# Patient Record
Sex: Female | Born: 1945 | Race: White | Hispanic: No | Marital: Married | Smoking: Former smoker
Health system: Southern US, Community
[De-identification: ages and names within clinical notes are randomized; demographics above are authoritative.]

## PROBLEM LIST (undated history)

## (undated) DIAGNOSIS — I10 Essential (primary) hypertension: Secondary | ICD-10-CM

## (undated) DIAGNOSIS — T7840XA Allergy, unspecified, initial encounter: Secondary | ICD-10-CM

## (undated) DIAGNOSIS — C801 Malignant (primary) neoplasm, unspecified: Secondary | ICD-10-CM

## (undated) DIAGNOSIS — E785 Hyperlipidemia, unspecified: Secondary | ICD-10-CM

## (undated) DIAGNOSIS — J45909 Unspecified asthma, uncomplicated: Secondary | ICD-10-CM

## (undated) HISTORY — DX: Essential (primary) hypertension: I10

## (undated) HISTORY — DX: Allergy, unspecified, initial encounter: T78.40XA

## (undated) HISTORY — DX: Hyperlipidemia, unspecified: E78.5

## (undated) HISTORY — DX: Unspecified asthma, uncomplicated: J45.909

---

## 1975-05-20 HISTORY — PX: DILATION AND CURETTAGE OF UTERUS: SHX78

## 1987-05-20 HISTORY — PX: ABDOMINAL HYSTERECTOMY: SHX81

## 1989-05-19 HISTORY — PX: WRIST ARTHROPLASTY: SHX1088

## 1998-05-19 HISTORY — PX: TRIGGER FINGER RELEASE: SHX641

## 2003-03-22 ENCOUNTER — Other Ambulatory Visit: Payer: Self-pay

## 2006-01-22 ENCOUNTER — Ambulatory Visit: Payer: Self-pay | Admitting: Family Medicine

## 2006-03-03 ENCOUNTER — Ambulatory Visit: Payer: Self-pay | Admitting: Gastroenterology

## 2006-03-03 LAB — HM COLONOSCOPY: HM COLON: NORMAL

## 2007-06-03 ENCOUNTER — Ambulatory Visit: Payer: Self-pay | Admitting: Family Medicine

## 2007-06-08 ENCOUNTER — Ambulatory Visit: Payer: Self-pay | Admitting: Family Medicine

## 2009-02-06 ENCOUNTER — Ambulatory Visit: Payer: Self-pay | Admitting: Family Medicine

## 2010-03-18 ENCOUNTER — Ambulatory Visit: Payer: Self-pay | Admitting: Family Medicine

## 2011-04-23 ENCOUNTER — Ambulatory Visit: Payer: Self-pay | Admitting: Family Medicine

## 2012-04-27 ENCOUNTER — Ambulatory Visit: Payer: Self-pay | Admitting: Family Medicine

## 2012-06-08 ENCOUNTER — Ambulatory Visit: Payer: Self-pay | Admitting: Family Medicine

## 2012-06-08 LAB — HM DEXA SCAN

## 2012-06-28 ENCOUNTER — Ambulatory Visit: Payer: Self-pay | Admitting: Family Medicine

## 2013-04-28 ENCOUNTER — Ambulatory Visit: Payer: Self-pay | Admitting: Family Medicine

## 2013-04-28 LAB — HM MAMMOGRAPHY

## 2013-06-30 LAB — LIPID PANEL
Cholesterol: 189 mg/dL (ref 0–200)
HDL: 53 mg/dL (ref 35–70)
LDL Cholesterol: 103 mg/dL
Triglycerides: 167 mg/dL — AB (ref 40–160)

## 2013-06-30 LAB — TSH: TSH: 2.65 u[IU]/mL (ref 0.41–5.90)

## 2013-06-30 LAB — HEMOGLOBIN A1C: Hgb A1c MFr Bld: 5.6 % (ref 4.0–6.0)

## 2013-08-17 LAB — CBC AND DIFFERENTIAL
HCT: 44 % (ref 36–46)
Hemoglobin: 15.1 g/dL (ref 12.0–16.0)
Platelets: 244 10*3/uL (ref 150–399)
WBC: 10.5 10^3/mL

## 2013-08-17 LAB — HEPATIC FUNCTION PANEL
ALT: 20 U/L (ref 7–35)
AST: 20 U/L (ref 13–35)

## 2013-08-17 LAB — BASIC METABOLIC PANEL
BUN: 14 mg/dL (ref 4–21)
Creatinine: 0.5 mg/dL (ref 0.5–1.1)
Glucose: 82 mg/dL
POTASSIUM: 4.5 mmol/L (ref 3.4–5.3)
SODIUM: 140 mmol/L (ref 137–147)

## 2014-09-17 DIAGNOSIS — C801 Malignant (primary) neoplasm, unspecified: Secondary | ICD-10-CM

## 2014-09-17 HISTORY — DX: Malignant (primary) neoplasm, unspecified: C80.1

## 2014-09-20 DIAGNOSIS — L309 Dermatitis, unspecified: Secondary | ICD-10-CM | POA: Insufficient documentation

## 2014-09-20 DIAGNOSIS — E781 Pure hyperglyceridemia: Secondary | ICD-10-CM | POA: Insufficient documentation

## 2014-09-20 DIAGNOSIS — R739 Hyperglycemia, unspecified: Secondary | ICD-10-CM | POA: Insufficient documentation

## 2014-09-20 DIAGNOSIS — J309 Allergic rhinitis, unspecified: Secondary | ICD-10-CM | POA: Insufficient documentation

## 2014-09-20 DIAGNOSIS — F4320 Adjustment disorder, unspecified: Secondary | ICD-10-CM | POA: Insufficient documentation

## 2014-09-20 DIAGNOSIS — I10 Essential (primary) hypertension: Secondary | ICD-10-CM | POA: Insufficient documentation

## 2014-09-20 DIAGNOSIS — D582 Other hemoglobinopathies: Secondary | ICD-10-CM | POA: Insufficient documentation

## 2014-09-20 DIAGNOSIS — J45909 Unspecified asthma, uncomplicated: Secondary | ICD-10-CM | POA: Insufficient documentation

## 2014-09-20 DIAGNOSIS — K529 Noninfective gastroenteritis and colitis, unspecified: Secondary | ICD-10-CM | POA: Insufficient documentation

## 2014-10-18 HISTORY — PX: SKIN CANCER EXCISION: SHX779

## 2014-10-25 ENCOUNTER — Other Ambulatory Visit: Payer: Self-pay | Admitting: Family Medicine

## 2014-10-30 ENCOUNTER — Ambulatory Visit (INDEPENDENT_AMBULATORY_CARE_PROVIDER_SITE_OTHER): Payer: Medicare Other | Admitting: Family Medicine

## 2014-10-30 ENCOUNTER — Encounter: Payer: Self-pay | Admitting: Family Medicine

## 2014-10-30 VITALS — BP 150/86 | HR 72 | Temp 97.7°F | Resp 16 | Ht 66.75 in | Wt 221.0 lb

## 2014-10-30 DIAGNOSIS — Z1211 Encounter for screening for malignant neoplasm of colon: Secondary | ICD-10-CM

## 2014-10-30 DIAGNOSIS — I10 Essential (primary) hypertension: Secondary | ICD-10-CM

## 2014-10-30 DIAGNOSIS — E781 Pure hyperglyceridemia: Secondary | ICD-10-CM

## 2014-10-30 DIAGNOSIS — R0789 Other chest pain: Secondary | ICD-10-CM

## 2014-10-30 DIAGNOSIS — Z1239 Encounter for other screening for malignant neoplasm of breast: Secondary | ICD-10-CM

## 2014-10-30 DIAGNOSIS — Z0000 Encounter for general adult medical examination without abnormal findings: Secondary | ICD-10-CM | POA: Diagnosis not present

## 2014-10-30 DIAGNOSIS — R739 Hyperglycemia, unspecified: Secondary | ICD-10-CM

## 2014-10-30 LAB — IFOBT (OCCULT BLOOD): IFOBT: NEGATIVE

## 2014-10-30 MED ORDER — LOSARTAN POTASSIUM 100 MG PO TABS: 100.0000 mg | ORAL_TABLET | Freq: Every day | ORAL | Status: DC

## 2014-10-30 NOTE — Progress Notes (Signed)
Patient ID: KORTNIE STOVALL, female   DOB: [birth year], 69 y.o.   MRN: [id number]  Patient: LIZMARIE WITTERS, Female    DOB: [birth year], 69 y.o.   MRN: [id number] Visit Date: 10/30/2014  Today's Provider: Margarita Rana, MD   Chief Complaint  Patient presents with  . Medicare Wellness  . Hypertension   Subjective:    Annual wellness visit JAMILAH JEAN is a 69 y.o. female who presents today for her Subsequent Annual Wellness Visit. She feels well. She reports exercising daily, pt reports that she stays active and takes daily walks for about 40 minutes a day. She reports she is sleeping well. Last CPE: 07/04/2013 06/08/12 BMD: osteopenia 03/03/06 Colonoscopy: Normal 04/28/13 Mommo-BI-RADS 1 02/27/11 EKG 07/04/13 PCV 13 09/30/11 Pneumovax 23 09/21/08 Tdap  Hypertension Patient is requesting that we refill her medication Losartan '100mg'$ . Patient reports that this medication was prescribed by Dr. Clayborn Bigness.  She also has had several episodes over the past few year. Has had negative cardiac work up. Thinks it mostly after she is hot and has eaten too much. Does have her gallbladder. Brother has same thing and he gets better with Tums.   -----------------------------------------------------------   Review of Systems  Constitutional: Negative.   HENT: Positive for congestion, postnasal drip, rhinorrhea, sneezing and trouble swallowing.   Eyes: Positive for photophobia.  Respiratory: Negative.   Cardiovascular: Negative.   Gastrointestinal: Negative.   Endocrine: Negative.   Genitourinary: Negative.   Musculoskeletal: Negative.   Skin: Positive for rash.  Allergic/Immunologic: Positive for environmental allergies.  Neurological: Negative.   Hematological: Negative.   Psychiatric/Behavioral: Negative.     History   Social History  . Marital Status: Married    Spouse Name: Eulas Post  . Number of Children: 2  . Years of Education: N/A   Occupational History  . retired    Social History Main  Topics  . Smoking status: Former Smoker -- 0.50 packs/day for 35 years    Quit date: 05/20/2003  . Smokeless tobacco: Never Used  . Alcohol Use: 3.6 oz/week    6 Glasses of wine per week     Comment: occasional  . Drug Use: No  . Sexual Activity: Not on file   Other Topics Concern  . Not on file   Social History Narrative    Patient Active Problem List   Diagnosis Date Noted  . 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 Allergic rhinitis 09/20/2014  . 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 Colitis 09/20/2014  . Dermatitis, eczematoid 09/20/2014  . Elevated hemoglobin 09/20/2014  . Calcium blood increased 09/20/2014  . Blood glucose elevated 09/20/2014  . BP (high blood pressure) 09/20/2014  . Hypertriglyceridemia 09/20/2014    Past Surgical History  Procedure Laterality Date  . Abdominal hysterectomy  1989    due to menorrhagia  . Dilation and curettage of uterus  1977  . Wrist arthroplasty Left 1991  . Trigger finger release Right 2000  . Skin cancer excision  10/2014    on chest; Dr. Evorn Gong    Her family history includes Arthritis in her mother; COPD in her father; Cancer in her brother, brother, and father; Cerebrovascular Disease in her mother; Heart disease in her mother; Hypertension in her father.    Previous Medications   CALCIUM-VITAMIN D 600-200 MG-UNIT PER TABLET    Take by mouth.   CETIRIZINE (ZYRTEC) 10 MG TABLET    Take by mouth.   CLOBETASOL CREAM (TEMOVATE) 0.05 %       DICLOFENAC SODIUM (  VOLTAREN) 1 % GEL    Place onto the skin.   DULOXETINE (CYMBALTA) 30 MG CAPSULE    Take by mouth.   FLUTICASONE (FLONASE) 50 MCG/ACT NASAL SPRAY    Place into the nose.   FLUTICASONE-SALMETEROL (ADVAIR) 250-50 MCG/DOSE AEPB    Inhale into the lungs.   FOLIC ACID (FOLVITE) 1 MG TABLET    Take by mouth.   LOSARTAN (COZAAR) 100 MG TABLET    Take by mouth.   METAXALONE (SKELAXIN) 800 MG TABLET    Take by mouth.   METHOTREXATE 2.5 MG TABLET    Take 5 tablets by mouth once a week.    METOPROLOL SUCCINATE (TOPROL-XL) 50 MG 24 HR TABLET    TAKE ONE TABLET BY MOUTH TWICE DAILY   MONTELUKAST (SINGULAIR) 10 MG TABLET    Take by mouth.   MULTIPLE VITAMIN TABLET    Take by mouth.   NYSTATIN OINTMENT (MYCOSTATIN)    NYSTATIN, 100000 UNIT/GM (External Ointment)  1 (one) Ointment Ointment Apply to affected area twice a day for 0 days  Quantity: 60;  Refills: 3   Ordered :10-Aug-2013  Margarita Rana MD;  Started 04-Jul-2013 Active    Patient Care Team: Margarita Rana, MD as PCP - General (Family Medicine)     Objective:   Vitals: BP 150/86 mmHg  Pulse 72  Temp(Src) 97.7 F (36.5 C) (Oral)  Resp 16  Ht 5' 6.75" (1.695 m)  Wt 221 lb (100.245 kg)  BMI 34.89 kg/m2  SpO2 97%  Physical Exam  Constitutional: She is oriented to person, place, and time. She appears well-developed and well-nourished.  HENT:  Head: Normocephalic and atraumatic.  Right Ear: Tympanic membrane, external ear and ear canal normal.  Left Ear: Tympanic membrane, external ear and ear canal normal.  Nose: Nose normal.  Mouth/Throat: Uvula is midline, oropharynx is clear and moist and mucous membranes are normal.  Eyes: Conjunctivae, EOM and lids are normal. Pupils are equal, round, and reactive to light.  Neck: Trachea normal and normal range of motion. Neck supple. Carotid bruit is not present. No thyroid mass and no thyromegaly present.  Cardiovascular: Normal rate, regular rhythm and normal heart sounds.   Pulmonary/Chest: Effort normal and breath sounds normal.  Abdominal: Soft. Normal appearance and bowel sounds are normal. There is no hepatosplenomegaly. There is no tenderness.  Genitourinary: No breast swelling, tenderness or discharge.  Musculoskeletal: Normal range of motion.  Lymphadenopathy:    She has no cervical adenopathy.    She has no axillary adenopathy.  Neurological: She is alert and oriented to person, place, and time. She has normal strength. No cranial nerve deficit.  Skin: Skin  is warm, dry and intact.  Psychiatric: She has a normal mood and affect. Her speech is normal and behavior is normal. Judgment and thought content normal. Cognition and memory are normal.    Activities of Daily Living In your present state of health, do you have any difficulty performing the following activities: 10/30/2014  Hearing? N  Vision? N  Difficulty concentrating or making decisions? N  Walking or climbing stairs? N  Dressing or bathing? N  Doing errands, shopping? N    Fall Risk Assessment Fall Risk  10/30/2014  Falls in the past year? No     Depression Screen PHQ 2/9 Scores 10/30/2014  PHQ - 2 Score 0    Cognitive Testing - 6-CIT  Correct? Score   What year is it? yes 0 0 or 4  What month is it? yes  0 0 or 3  Memorize:    Pia Mau,  41,  [place],      What time is it? (within 1 hour) yes 0 0 or 3  Count backwards from 20 yes 0 0, 2, or 4  Name the months of the year yes 0 0, 2, or 4  Repeat name & address above yes 0 0, 2, 4, 6, 8, or 10       TOTAL SCORE  0/28   Interpretation:  Normal  Normal (0-7) Abnormal (8-28)       Assessment & Plan:   1. Essential hypertension and atypical chest pain-   EKG normal. Episodes are infrequent. Does not want  further treatment unless worsens or does not resolve. Will seek treatment if pain lasts.  Will refill her Losartan also.   - EKG 12-Lead - Comprehensive metabolic panel - CBC with Differential/Platelet  2. Colon cancer screening - IFOBT POC (occult bld, rslt in office)  3. Blood glucose elevated - Hemoglobin A1c - TSH  4. Hypertriglyceridemia - Lipid panel  5. Medicare annual wellness visit, subsequent     Stable. Patient advised continue working on healthy lifestyle.   6. Breast cancer screening - MM DIGITAL SCREENING BILATERAL; Future    Annual Wellness Visit  Reviewed patient's Family Medical History Reviewed and updated list of patient's medical providers Assessment of  cognitive impairment was done Assessed patient's functional ability Established a written schedule for health screening [place] Completed and Reviewed  Exercise Activities and Dietary recommendations Goals    . Exercise 150 minutes per week (moderate activity)       Immunization History  Administered Date(s) Administered  . Pneumococcal Conjugate-13 07/04/2013  . Pneumococcal Polysaccharide-23 09/30/2011  . Td 09/21/2008    Health Maintenance  Topic Date Due  . ZOSTAVAX  07/01/2004  . INFLUENZA VACCINE  12/18/2014  . MAMMOGRAM  04/29/2015  . COLONOSCOPY  03/03/2016  . TETANUS/TDAP  09/22/2018  . DEXA SCAN  Completed  . PNA vac Low Risk Adult  Completed      Discussed health benefits of physical activity, and encouraged her to engage in regular exercise appropriate for her age and condition.    ------------------------------------------------------------------------------------------------------------   Patient seen and examined by Dr. Jerrell Belfast, and note scribed by Philbert Riser. Dimas, CMA.  I have reviewed the document for accuracy and completeness and I agree with above. Jerrell Belfast, MD   Margarita Rana, MD

## 2014-11-02 ENCOUNTER — Ambulatory Visit
Admission: RE | Admit: 2014-11-02 | Discharge: 2014-11-02 | Disposition: A | Discharge status: Home / Self Care | Payer: Medicare Other | Source: Ambulatory Visit | Attending: Family Medicine | Admitting: Family Medicine

## 2014-11-02 ENCOUNTER — Other Ambulatory Visit: Payer: Self-pay | Admitting: Family Medicine

## 2014-11-02 DIAGNOSIS — Z1231 Encounter for screening mammogram for malignant neoplasm of breast: Secondary | ICD-10-CM | POA: Insufficient documentation

## 2014-11-02 DIAGNOSIS — Z1239 Encounter for other screening for malignant neoplasm of breast: Secondary | ICD-10-CM

## 2014-11-02 HISTORY — DX: Malignant (primary) neoplasm, unspecified: C80.1

## 2014-11-03 ENCOUNTER — Telehealth: Payer: Self-pay

## 2014-11-03 LAB — CBC WITH DIFFERENTIAL/PLATELET
BASOS ABS: 0.1 10*3/uL (ref 0.0–0.2)
Basos: 1 %
EOS (ABSOLUTE): 0.2 10*3/uL (ref 0.0–0.4)
Eos: 2 %
HEMATOCRIT: 43.3 % (ref 34.0–46.6)
Hemoglobin: 14.4 g/dL (ref 11.1–15.9)
Immature Grans (Abs): 0 10*3/uL (ref 0.0–0.1)
Immature Granulocytes: 0 %
LYMPHS: 22 %
Lymphocytes Absolute: 2.1 10*3/uL (ref 0.7–3.1)
MCH: 31.9 pg (ref 26.6–33.0)
MCHC: 33.3 g/dL (ref 31.5–35.7)
MCV: 96 fL (ref 79–97)
MONOS ABS: 0.6 10*3/uL (ref 0.1–0.9)
Monocytes: 6 %
NEUTROS ABS: 6.5 10*3/uL (ref 1.4–7.0)
Neutrophils: 69 %
Platelets: 208 10*3/uL (ref 150–379)
RBC: 4.52 x10E6/uL (ref 3.77–5.28)
RDW: 14 % (ref 12.3–15.4)
WBC: 9.4 10*3/uL (ref 3.4–10.8)

## 2014-11-03 LAB — TSH: TSH: 2.92 u[IU]/mL (ref 0.450–4.500)

## 2014-11-03 LAB — COMPREHENSIVE METABOLIC PANEL
A/G RATIO: 1.7 (ref 1.1–2.5)
ALBUMIN: 4 g/dL (ref 3.5–4.8)
ALT: 27 IU/L (ref 0–32)
AST: 20 IU/L (ref 0–40)
Alkaline Phosphatase: 71 IU/L (ref 39–117)
BUN/Creatinine Ratio: 19 (ref 11–26)
BUN: 11 mg/dL (ref 8–27)
Bilirubin Total: 0.6 mg/dL (ref 0.0–1.2)
CALCIUM: 9.9 mg/dL (ref 8.7–10.3)
CO2: 24 mmol/L (ref 18–29)
CREATININE: 0.58 mg/dL (ref 0.57–1.00)
Chloride: 103 mmol/L (ref 97–108)
GFR calc Af Amer: 108 mL/min/{1.73_m2} (ref >59)
GFR calc non Af Amer: 94 mL/min/{1.73_m2} (ref >59)
GLUCOSE: 85 mg/dL (ref 65–99)
Globulin, Total: 2.3 g/dL (ref 1.5–4.5)
Potassium: 4.3 mmol/L (ref 3.5–5.2)
Sodium: 141 mmol/L (ref 134–144)
Total Protein: 6.3 g/dL (ref 6.0–8.5)

## 2014-11-03 LAB — LIPID PANEL
Chol/HDL Ratio: 3.2 ratio units (ref 0.0–4.4)
Cholesterol, Total: 172 mg/dL (ref 100–199)
HDL: 53 mg/dL (ref >39)
LDL CALC: 93 mg/dL (ref 0–99)
TRIGLYCERIDES: 129 mg/dL (ref 0–149)
VLDL Cholesterol Cal: 26 mg/dL (ref 5–40)

## 2014-11-03 LAB — HEMOGLOBIN A1C
ESTIMATED AVERAGE GLUCOSE: 117 mg/dL
Hgb A1c MFr Bld: 5.7 % — ABNORMAL HIGH (ref 4.8–5.6)

## 2014-11-03 NOTE — Telephone Encounter (Signed)
Left message with female to call back. Renaldo Fiddler, CMA

## 2014-11-03 NOTE — Telephone Encounter (Signed)
Patient advised.

## 2014-11-03 NOTE — Telephone Encounter (Signed)
-----   Message from Margarita Rana, MD sent at 11/03/2014  9:57 AM EDT ----- Labs stable, including cholesterol and blood sugar.  Please notify patient. Thanks.

## 2014-12-20 ENCOUNTER — Other Ambulatory Visit: Payer: Self-pay

## 2014-12-20 ENCOUNTER — Other Ambulatory Visit: Payer: Self-pay | Admitting: Family Medicine

## 2014-12-20 DIAGNOSIS — J45909 Unspecified asthma, uncomplicated: Secondary | ICD-10-CM

## 2014-12-20 MED ORDER — FLUTICASONE-SALMETEROL 250-50 MCG/DOSE IN AEPB: INHALATION_SPRAY | RESPIRATORY_TRACT | Status: DC

## 2014-12-20 NOTE — Telephone Encounter (Signed)
Last ov was on 08/10/2013 and future appointment is on 05/01/2015.

## 2014-12-26 ENCOUNTER — Other Ambulatory Visit: Payer: Self-pay | Admitting: Family Medicine

## 2014-12-26 DIAGNOSIS — J309 Allergic rhinitis, unspecified: Secondary | ICD-10-CM

## 2015-01-16 ENCOUNTER — Other Ambulatory Visit: Payer: Self-pay | Admitting: Family Medicine

## 2015-01-16 DIAGNOSIS — F4329 Adjustment disorder with other symptoms: Secondary | ICD-10-CM

## 2015-03-09 ENCOUNTER — Other Ambulatory Visit: Payer: Self-pay

## 2015-03-09 DIAGNOSIS — M25569 Pain in unspecified knee: Secondary | ICD-10-CM

## 2015-03-09 MED ORDER — DICLOFENAC SODIUM 1 % TD GEL: 4.0000 g | Freq: Four times a day (QID) | TRANSDERMAL | Status: DC

## 2015-03-09 NOTE — Telephone Encounter (Signed)
Last OV 07/2013

## 2015-04-10 ENCOUNTER — Other Ambulatory Visit: Payer: Self-pay | Admitting: Family Medicine

## 2015-04-10 DIAGNOSIS — I10 Essential (primary) hypertension: Secondary | ICD-10-CM

## 2015-05-01 ENCOUNTER — Ambulatory Visit: Payer: Medicare Other | Admitting: Family Medicine

## 2015-05-29 ENCOUNTER — Other Ambulatory Visit: Payer: Self-pay | Admitting: Family Medicine

## 2015-08-21 ENCOUNTER — Other Ambulatory Visit: Payer: Self-pay

## 2015-08-21 DIAGNOSIS — M25569 Pain in unspecified knee: Secondary | ICD-10-CM

## 2015-08-21 MED ORDER — DICLOFENAC SODIUM 1 % TD GEL: 4.0000 g | Freq: Four times a day (QID) | TRANSDERMAL | Status: DC

## 2015-09-05 ENCOUNTER — Emergency Department: Payer: No Typology Code available for payment source

## 2015-09-05 ENCOUNTER — Emergency Department
Admission: EM | Admit: 2015-09-05 | Discharge: 2015-09-05 | Disposition: A | Discharge status: Home / Self Care | Payer: No Typology Code available for payment source | Attending: Emergency Medicine | Admitting: Emergency Medicine

## 2015-09-05 DIAGNOSIS — I10 Essential (primary) hypertension: Secondary | ICD-10-CM | POA: Insufficient documentation

## 2015-09-05 DIAGNOSIS — J45909 Unspecified asthma, uncomplicated: Secondary | ICD-10-CM | POA: Insufficient documentation

## 2015-09-05 DIAGNOSIS — Z87891 Personal history of nicotine dependence: Secondary | ICD-10-CM | POA: Insufficient documentation

## 2015-09-05 DIAGNOSIS — S4992XA Unspecified injury of left shoulder and upper arm, initial encounter: Secondary | ICD-10-CM | POA: Insufficient documentation

## 2015-09-05 DIAGNOSIS — W109XXA Fall (on) (from) unspecified stairs and steps, initial encounter: Secondary | ICD-10-CM | POA: Insufficient documentation

## 2015-09-05 MED ORDER — NAPROXEN 500 MG PO TABS
500.0000 mg | ORAL_TABLET | Freq: Two times a day (BID) | ORAL | Status: AC
Start: 2015-09-05 — End: ?

## 2015-09-05 MED ORDER — TRAMADOL HCL 50 MG PO TABS
50.0000 mg | ORAL_TABLET | Freq: Four times a day (QID) | ORAL | Status: DC | PRN
Start: 2015-09-05 — End: 2017-09-03

## 2015-09-05 MED ORDER — NAPROXEN 250 MG PO TABS
500.0000 mg | ORAL_TABLET | Freq: Once | ORAL | Status: AC
Start: 2015-09-05 — End: 2015-09-05
  Administered 2015-09-05: 500 mg via ORAL
  Filled 2015-09-05: qty 2

## 2015-09-05 NOTE — Discharge Instructions (Signed)
Dear Kimberly Bradshaw:    Thank you for choosing one of [facility] emergency departments.  I hope your visit today was EXCELLENT.    Specific instructions for your visit today:    See an orthopedic back home    Ibuprofen, Over The Counter or prescription naproxen    For pain and inflammation, take ibuprofen (Motrin or Advil) 200mg  tabs. Take 4 tablets every 8 hours as needed for pain and/or inflammation.                Humeral Head Fracture    You have been diagnosed with a fracture of the humeral head.     A fracture is a break in a bone. It means the same thing as saying a "broken bone." Fractures normally heal in about 6-8 weeks. The place that is broken will eventually become stronger than the area around it. The fracture may be treated with a splint. This is a temporary solution to keep the broken bone from moving. Later, an orthopedic (bone) doctor will probably take off the splint and give you a cast. Most fractures can be managed with a splint or cast, but sometimes surgery is needed. An orthopedic doctor will help decide if you need surgery for the fracture.    The humerus is the long bone that makes up the upper arm. It goes from the shoulder to the elbow. The humeral head is the "ball" at the top of the humerus that connects it with the shoulder.    Injury to the upper arm is common when you use your arms to stop you during a fall. Also, arm injuries can happen in car accidents and sports activities. When bones break, there can also be injury to nearby nerves, blood vessels, ligaments (connecting tissue) or muscles. If the injury is serious, any nerve damage can last for a long time and it might be permanent.    The treatment for your humeral head fracture depends on the seriousness of the injury. Many fractures can be fixed without surgery if the bone fragments have not been displaced (moved out of place). If the injury is minor and not displaced, a sling followed by physical  therapy or rehabilitation improve function and recovery. This will also happen if your bone fragments are not displaced. Displaced or separated humeral head fractures often require surgery with plates, screws, or pins to hold the bones in place while they heal.    Make sure to follow up with an orthopedic (bone) surgeon. An orthopedic surgeon or your doctor will tell you when your arm injury is healed enough to return to normal activities.     You are safe to go home. Though we don't believe your condition is serious right now, it is important to be careful. Sometimes a problem that seems mild can become serious later. This is why it is very important that you return here or go to the nearest Emergency Department if you are not improving or your symptoms are getting worse.    YOU SHOULD SEEK MEDICAL ATTENTION IMMEDIATELY, EITHER HERE OR AT THE NEAREST EMERGENCY DEPARTMENT, IF ANY OF THE FOLLOWING OCCUR:      You have a fever (temperature higher than 100.76F or 38C).   You have new pain or pain that does not go away even after taking prescription medicine.   You get chest pain, dizziness, or shortness of breath.   Your splint or cast feels too tight or you have numbness or tingling  in your fingers.   You get headaches, vision changes, or neck pain.   You have any other questions or concerns about your injury.    If you can t follow up with your doctor, or if at any time you feel you need to be rechecked or seen again, come back here or go to the nearest emergency department.               Elevated Blood Pressure    During your visit today your blood pressure was higher than normal.    Check your blood pressure several times over the next several days, then follow up with your regular doctor. If you do not have a doctor, ask the medical staff to refer you to one.    You may need medication for your blood pressure if it stays high. Untreated high blood pressure can cause damage to your heart and kidneys  and may lead to a heart attack or stroke. It is VERY IMPORTANT to follow up with your doctor.   Check your blood pressure daily and follow up with your doctor.   A doctor will diagnose high blood pressure only if your blood pressure is high for several days. Many pharmacies have machines that let you check your own blood pressure. You can also check with a fire station to see whether a paramedic will take your blood pressure. Another option is to purchase a blood pressure monitor to use at home. These are available at most pharmacies.     YOU SHOULD SEEK MEDICAL ATTENTION IMMEDIATELY, EITHER HERE OR AT THE NEAREST EMERGENCY DEPARTMENT, IF ANY OF THE FOLLOWING OCCURS:   You have a sudden or severe headache.   You are numb, tingly, or weak on one side of your body, half of your face droops, or you have trouble speaking.   You have chest pain.   You are short of breath.                If you do not continue to improve or your condition worsens, please contact your doctor or return immediately to the Emergency Department.    Sincerely,  Alailah Safley, [place] D[place], MD  Attending Emergency Physician  [facility] Emergency Department    OBTAINING A PRIMARY CARE APPOINTMENT    Primary care physicians (PCPs, also known as primary care doctors) are either internists or family medicine doctors. Both types of PCPs focus on health promotion, disease prevention, patient education and counseling, and treatment of acute and chronic medical conditions.    Call for an appointment with a primary care doctor.  Ask to see who is taking new patients.     [facility]  telephone:  [phone]  [email or web address]    For a pediatrician, call the [facility] referral line below.  You can also call to make an appointment at [facility] for [facility] (except [facility] and [facility]):    [address]  [place], [place] [address]  [phone]    Kimberly Bradshaw  Call [phone] (available 24 hours a day,  7 days a week) if you need any further referrals and we can help you find a primary care doctor or specialist.  Also, available online at:  [email or web address]    For more information regarding our services at [facility], please call the number above or visit the website [email or web address]    YOUR CONTACT INFORMATION  Before leaving please check with registration to make sure we have  an up-to-date contact number.  You can call registration at [phone], Option 7 [facility] location) or [phone], Option 1 [facility] location) to update your information.  For questions about your hospital bill, please call [phone].  For questions about your Emergency Dept Physician bill please call [phone].      FREE HEALTH SERVICES  If you need help with health or social services, please call 2-1-1 for a free referral to resources in your area.  2-1-1 is a free service connecting people with information on health insurance, free clinics, pregnancy, mental health, dental care, food assistance, housing, and substance abuse counseling.  Also, available online at:  http://www.[facility].org    MEDICAL RECORDS AND TESTS  Certain laboratory test results do not come back the same day, for example urine cultures.   We will contact you if other important findings are noted.  Radiology films are often reviewed again to ensure accuracy.  If there is any discrepancy, we will notify you.      Please call [phone] [facility] location) or [phone] ([place] location) to pick up a complimentary CD of any radiology studies performed.  If you or your doctor would like to request a copy of your medical records, please call [phone].      ORTHOPEDIC INJURY   Please know that significant injuries can exist even when an initial x-ray is read as normal or negative.  This can occur because some fractures (broken bones) are not initially visible on x-rays.  For this reason,  close outpatient follow-up with your primary care doctor or bone specialist (orthopedist) is required.    MEDICATIONS AND FOLLOWUP  Please be aware that some prescription medications can cause drowsiness.  Use caution when driving or operating machinery.    The examination and treatment you have received in our Emergency Department is provided on an emergency basis, and is not intended to be a substitute for your primary care physician.  It is important that your doctor checks you again and that you report any new or remaining problems at that time.      24 HOUR PHARMACIES  Two nearby 24 hour pharmacies are:    [facility]  [address]  [place], [place] [address]  [phone]    [facility]  [address]  [place], [place] [address]  [phone]      ASSISTANCE WITH INSURANCE    Affordable Care Act  [facility])  Call to start or finish an application, compare plans, enroll or ask a question.  [phone]  TTY: [phone]  Web:  Healthcare.gov    Help Enrolling in [facility]  [employer] [place]  (207)340-5958 (TOLL-FREE)  410-856-5792 (TTY)  Web:  Http://www.coverva.org    Local Help Enrolling in the [facility]  [place] [facility]  647-475-8887 (MAIN)  Email:  health-help@nvfs .org  Web:  BlackjackMyths.is  Address:  [address], [address] [place], [place] [address]    SEDATING MEDICATIONS  Sedating medications include strong pain medications (e.g. narcotics), muscle relaxers, benzodiazepines (used for anxiety and as muscle relaxers), Benadryl/diphenhydramine and other antihistamines for allergic reactions/itching, and other medications.  If you are unsure if you have received a sedating medication, please ask your physician or nurse.  If you received a sedating medication: DO NOT drive a car. DO NOT operate machinery. DO NOT perform jobs where you need to be alert.  DO NOT drink alcoholic beverages while taking this medicine.     If you get dizzy, sit or lie down  at the first signs. Be  careful going up and down stairs.  Be extra careful to prevent falls.     Never give this medicine to others.     Keep this medicine out of reach of children.     Do not take or save old medicines. Throw them away when outdated.     Keep all medicines in a cool, dry place. DO NOT keep them in your bathroom medicine cabinet or in a cabinet above the stove.    MEDICATION REFILLS  Please be aware that we cannot refill any prescriptions through the ER. If you need further treatment from what is provided at your ER visit, please follow up with your primary care doctor or your pain management specialist.

## 2015-09-05 NOTE — ED Provider Notes (Signed)
[facility] EMERGENCY DEPARTMENT H&P         CLINICAL SUMMARY          Diagnosis:    .     Final diagnoses:   None                 Disposition:      ED Disposition     None                     CLINICAL INFORMATION        HPI:      Chief Complaint: Fall and Arm Injury  .    Kimberly Bradshaw is a 70 y.o. female with PMHx of HTN, asthma who presents with acute left shoulder pain that started after falling down a stair pta.    She and her granddaughter were leaving a friend's house when she slipped on one of the final stairs and fell onto her left arm. C/o of left shoulder pain. Pain worsens with movement, alleviates with rest. No pain with palpation.     No head impact/injury, no LOC. Denies neck pain. Not on blood thinners.   No prior surgeries to left shoulder.    History obtained from: Patient, granddaughter      ROS:      Positive and negative ROS elements as per HPI.   All Other Systems Reviewed and Negative: Yes        Physical Exam:      Pulse 68  BP 194/81 mmHg  Resp 16  SpO2 96 %  Temp 98 F (36.7 C)    Physical Exam   Nursing note and vitals reviewed.   Constitutional: Pt is oriented to person, place, and time. Pt appears well-developed and well-nourished.  Eyes: Conjunctivae normal . No icterus  ENT: no nasal [place] mmm   Cardiovascular: Normal rate, regular rhythm and normal heart sounds.   Musculoskeletal: FROM left arm. Pain with abduction greater than 90 degrees. Neurovascularly intact.   Neurological: Pt is alert and oriented to person, place, and time. GCS eye subscore is 4. GCS verbal subscore is 5. GCS motor subscore is 6. MAE   Skin: Skin is warm and dry.   Psychiatric: Pt has a normal  affect. Pt behavior is normal.               PAST HISTORY        Primary Care Provider: No primary care provider on file.        PMH/PSH:    .     Past Medical History   Diagnosis Date   . Hypertension    . Asthma      seasonal       She has past surgical history that includes Hysterectomy and EXPLORATORY  LAPAROSCOPY.      Social/Family History:      She reports that she has quit smoking. She does not have any smokeless tobacco history on file. She reports that she drinks alcohol. She reports that she does not use illicit drugs.    No family history on file.      Listed Medications on Arrival:    .     Previous Medications    CETIRIZINE HCL (ZYRTEC PO)    Take by mouth.    FLUTICASONE-SALMETEROL (ADVAIR HFA IN)    Inhale into the lungs.    LOSARTAN POTASSIUM PO    Take by mouth.    METOPROLOL TARTRATE PO  Take by mouth.    MONTELUKAST SODIUM (SINGULAIR PO)    Take by mouth.      Allergies: She is allergic to furadantin and phenobarbital.            VISIT INFORMATION                    Medications Given in the ED:    .     ED Medication Orders     None            Procedures:            Interpretations:      MDM:     Pulse Ox Analysis interpreted by me is 96% on RA nl without need for supplementation     Radiologic Interpretation by me: avulsion fx prox humerus    DDX: fracture, dislocation, contusion, bursitis, sprain    Clinical Course in the ED:     Stable for [place] with supprotive care and fu when gpoes back home            Discharge Prescriptions     None                  RESULTS        Lab Results:      Results     ** No results found for the last 24 hours. **              Radiology Results:      Humerus Left AP Lateral    (Results Pending)   Shoulder Left 2+ Views    (Results Pending)               Scribe Attestation:      I was acting as a Neurosurgeon for Carmon Sails, MD on [place],Kimberly Bradshaw  Treatment Team: Scribe: Verlene Mayer   I am the first provider for this patient and I personally performed the services documented. Treatment Team: Scribe: Verlene Mayer is scribing for me on [place],Corvette Bradshaw. This note accurately reflects work and decisions made by me.  Carmon Sails, MD             Carmon Sails, MD  09/06/15 [phone]

## 2015-09-05 NOTE — ED Notes (Signed)
Missed a step and fell, landed on concrete floor. C/o pain to left upper arm with certain movements. No dizziness prior to fall. No head, neck, or back pain. Patient is visiting from out of town. Took ibuprofen prior to arrival.

## 2015-09-11 ENCOUNTER — Encounter: Payer: Self-pay | Admitting: Family Medicine

## 2015-09-11 ENCOUNTER — Ambulatory Visit (INDEPENDENT_AMBULATORY_CARE_PROVIDER_SITE_OTHER): Payer: Medicare Other | Admitting: Family Medicine

## 2015-09-11 VITALS — BP 154/84 | HR 64 | Temp 98.2°F | Resp 16 | Wt 206.0 lb

## 2015-09-11 DIAGNOSIS — S4992XD Unspecified injury of left shoulder and upper arm, subsequent encounter: Secondary | ICD-10-CM | POA: Diagnosis not present

## 2015-09-11 DIAGNOSIS — I10 Essential (primary) hypertension: Secondary | ICD-10-CM | POA: Diagnosis not present

## 2015-09-11 MED ORDER — HYDROCHLOROTHIAZIDE 12.5 MG PO TABS: 12.5000 mg | ORAL_TABLET | Freq: Every day | ORAL | Status: DC

## 2015-09-11 NOTE — Progress Notes (Signed)
Patient ID: Elizabeth [name], female   DOB: 11-20-1945, 70 y.o.   MRN: [id number]       Patient: Elizabeth [name] Female    DOB: 06/18/1945   70 y.o.   MRN: [id number] Visit Date: 09/11/2015  Today's Provider: Margarita Rana, MD   Chief Complaint  Patient presents with  . Follow-up  . Hypertension   Subjective:    HPI  Follow up ER visit  Patient was seen in ER for fall on 09/05/2015 in [place]. Patient reports she tripped and fell on to concrete floor. Patient reports she was told that she had a possible fracture. Patient reports that she went to Dr. Rock Nephew chiropractic yesterday and was told that her rotator cuff was injured but no fracture. Patient reports that she was told that her BP was elevated 191/81. She was treated for left shoulder injury. Treatment for this included tramadol and naproxen, and x-ray. She reports excellent compliance with treatment. She reports this condition is Improved. ------------------------------------------------------------------------------------     Hypertension, follow-up:  BP Readings from Last 3 Encounters:  09/11/15 154/84  10/30/14 150/86  08/10/13 126/76    She was last seen for hypertension 10 months ago.  BP at that visit was 150/86. Management changes since that visit include no changes. She reports excellent compliance with treatment. She is not having side effects.  She is exercising. She is adherent to low salt diet.   Outside blood pressures are not being checked. She is experiencing none.  Patient denies chest pain.   Cardiovascular risk factors include advanced age (older than 12 for men, 58 for women).  Use of agents associated with hypertension: none.     Weight trend: stable Wt Readings from Last 3 Encounters:  09/11/15 206 lb (93.441 kg)  10/30/14 221 lb (100.245 kg)  08/10/13 218 lb (98.884 kg)    Current diet: in general, a "healthy" diet     ------------------------------------------------------------------------ Patient reports that she had blood work done in 06/2015 requested by Dr. Evorn Gong her dermatologist. Patient reports that we can request a copy of her lab results from his office. Patient reports that he checks every lab except A1C.   Allergies  Allergen Reactions  . Furadantin  [Nitrofurantoin]   . Phenobarbital    Previous Medications   CALCIUM-VITAMIN D 600-200 MG-UNIT PER TABLET    Take by mouth.   CETIRIZINE (ZYRTEC) 10 MG TABLET    TAKE 1 TABLET BY MOUTH ONCE A DAY   CLOBETASOL CREAM (TEMOVATE) 0.05 %       DICLOFENAC SODIUM (VOLTAREN) 1 % GEL    Apply 4 g topically 4 (four) times daily.   DULOXETINE (CYMBALTA) 30 MG CAPSULE    TAKE ONE CAPSULE BY MOUTH DAILY AS NEEDED   FLUTICASONE (FLONASE) 50 MCG/ACT NASAL SPRAY    Place 2 sprays into the nose as needed.    FLUTICASONE-SALMETEROL (ADVAIR DISKUS) 250-50 MCG/DOSE AEPB    INHALE ONE PUFF INTO LUNGS EVERY 12 HOURS TWICE A DAY. RINSE MOUTH AFTER USE.   FOLIC ACID (FOLVITE) 1 MG TABLET    Take 1 mg by mouth daily.    LOSARTAN (COZAAR) 100 MG TABLET    Take 1 tablet (100 mg total) by mouth daily.   METHOTREXATE 2.5 MG TABLET    Take 5 tablets by mouth once a week.   METOPROLOL SUCCINATE (TOPROL-XL) 50 MG 24 HR TABLET    TAKE 1 TABLET BY MOUTH TWICE A DAY   MONTELUKAST (SINGULAIR) 10 MG TABLET  TAKE ONE TABLET BY MOUTH EVERY DAY   MULTIPLE VITAMIN TABLET    Take by mouth.   NAPROXEN (NAPROSYN) 500 MG TABLET    Take 500 mg by mouth 2 (two) times daily with a meal.   TRAMADOL (ULTRAM) 50 MG TABLET    Take by mouth every 6 (six) hours as needed.    Review of Systems  Constitutional: Negative.   Cardiovascular: Negative.   Musculoskeletal: Negative.     Social History  Substance Use Topics  . Smoking status: Former Smoker -- 0.50 packs/day for 35 years    Quit date: 05/20/2003  . Smokeless tobacco: Never Used  . Alcohol Use: 3.6 oz/week    6 Glasses of  wine per week     Comment: occasional   Objective:   BP 154/84 mmHg  Pulse 64  Temp(Src) 98.2 F (36.8 C) (Oral)  Resp 16  Wt 206 lb (93.441 kg)  Physical Exam  Constitutional: She is oriented to person, place, and time. She appears well-developed and well-nourished.  Cardiovascular: Normal rate, regular rhythm and normal heart sounds.   Pulmonary/Chest: Effort normal and breath sounds normal.  Neurological: She is alert and oriented to person, place, and time.  Psychiatric: She has a normal mood and affect. Her behavior is normal. Judgment and thought content normal.      Assessment & Plan:     1. Essential hypertension Not at goal. Patient started on hydrochlorothiazide 12.5 mg as below. Patient advised to continue current medications and plan of care. - hydrochlorothiazide (HYDRODIURIL) 12.5 MG tablet; Take 1 tablet (12.5 mg total) by mouth daily.  Dispense: 30 tablet; Refill: 3  2. Shoulder injury, left, subsequent encounter Patient referred to orthopedics for evaluation and follow-up. - AMB referral to orthopedics     Patient seen and examined by Dr. Jerrell Belfast, and note scribed by Philbert Riser. Dimas, CMA. I have reviewed the document for accuracy and completeness and I agree with above. - Jerrell Belfast, MD   Margarita Rana, MD  [facility]

## 2015-09-14 ENCOUNTER — Encounter: Payer: Self-pay | Admitting: Family Medicine

## 2015-09-24 ENCOUNTER — Other Ambulatory Visit: Payer: Self-pay | Admitting: Family Medicine

## 2015-09-24 DIAGNOSIS — Z1231 Encounter for screening mammogram for malignant neoplasm of breast: Secondary | ICD-10-CM

## 2015-11-01 ENCOUNTER — Encounter: Payer: Self-pay | Admitting: Family Medicine

## 2015-11-01 ENCOUNTER — Ambulatory Visit (INDEPENDENT_AMBULATORY_CARE_PROVIDER_SITE_OTHER): Payer: Medicare Other | Admitting: Family Medicine

## 2015-11-01 VITALS — BP 110/70 | HR 64 | Temp 97.7°F | Resp 16 | Ht 66.0 in | Wt 200.0 lb

## 2015-11-01 DIAGNOSIS — R011 Cardiac murmur, unspecified: Secondary | ICD-10-CM | POA: Diagnosis not present

## 2015-11-01 DIAGNOSIS — J45909 Unspecified asthma, uncomplicated: Secondary | ICD-10-CM

## 2015-11-01 DIAGNOSIS — F4329 Adjustment disorder with other symptoms: Secondary | ICD-10-CM | POA: Diagnosis not present

## 2015-11-01 DIAGNOSIS — Z780 Asymptomatic menopausal state: Secondary | ICD-10-CM

## 2015-11-01 DIAGNOSIS — I10 Essential (primary) hypertension: Secondary | ICD-10-CM | POA: Diagnosis not present

## 2015-11-01 DIAGNOSIS — Z0000 Encounter for general adult medical examination without abnormal findings: Secondary | ICD-10-CM

## 2015-11-01 DIAGNOSIS — J309 Allergic rhinitis, unspecified: Secondary | ICD-10-CM | POA: Diagnosis not present

## 2015-11-01 DIAGNOSIS — R739 Hyperglycemia, unspecified: Secondary | ICD-10-CM | POA: Diagnosis not present

## 2015-11-01 DIAGNOSIS — E781 Pure hyperglyceridemia: Secondary | ICD-10-CM

## 2015-11-01 MED ORDER — METOPROLOL SUCCINATE ER 50 MG PO TB24: 50.0000 mg | ORAL_TABLET | Freq: Two times a day (BID) | ORAL | Status: DC

## 2015-11-01 MED ORDER — FLUTICASONE PROPIONATE 50 MCG/ACT NA SUSP: 2.0000 | Freq: Every day | NASAL | Status: DC

## 2015-11-01 MED ORDER — CETIRIZINE HCL 10 MG PO TABS: 10.0000 mg | ORAL_TABLET | Freq: Every day | ORAL | Status: DC

## 2015-11-01 MED ORDER — HYDROCHLOROTHIAZIDE 12.5 MG PO TABS: 12.5000 mg | ORAL_TABLET | Freq: Every day | ORAL | Status: DC

## 2015-11-01 MED ORDER — MONTELUKAST SODIUM 10 MG PO TABS: 10.0000 mg | ORAL_TABLET | Freq: Every day | ORAL | Status: DC

## 2015-11-01 MED ORDER — DULOXETINE HCL 30 MG PO CPEP: ORAL_CAPSULE | ORAL | Status: DC

## 2015-11-01 MED ORDER — FLUTICASONE-SALMETEROL 250-50 MCG/DOSE IN AEPB: INHALATION_SPRAY | RESPIRATORY_TRACT | Status: DC

## 2015-11-01 MED ORDER — LOSARTAN POTASSIUM 100 MG PO TABS: 100.0000 mg | ORAL_TABLET | Freq: Every day | ORAL | Status: DC

## 2015-11-01 NOTE — Progress Notes (Signed)
Patient ID: Elizabeth [name], female   DOB: 1946/04/17, 70 y.o.   MRN: [id number]       Patient: Elizabeth [name], Female    DOB: [birth year], 70 y.o.   MRN: [id number] Visit Date: 11/01/2015  Today's Provider: Margarita Rana, MD   Chief Complaint  Patient presents with  . Medicare Wellness   Subjective:    Annual wellness visit Elizabeth [name] is a 70 y.o. female. She feels well. She reports exercising daily. She reports she is sleeping well.  11/03/14 Mammogram-BI-RADS 1 06/08/12 BMD-osteopenia 03/03/06 Colonoscopy-normal, Dr. Allen Norris -----------------------------------------------------------   Review of Systems  Constitutional: Negative.   HENT: Positive for rhinorrhea, sneezing and tinnitus.   Eyes: Negative.   Respiratory: Positive for wheezing.   Cardiovascular: Negative.   Gastrointestinal: Negative.   Endocrine: Negative.   Genitourinary: Negative.   Musculoskeletal: Positive for back pain.  Skin: Negative.   Allergic/Immunologic: Positive for environmental allergies.  Neurological: Negative.   Hematological: Bruises/bleeds easily.  Psychiatric/Behavioral: Negative.     Social History   Social History  . Marital Status: Married    Spouse Name: Eulas Post  . Number of Children: 2  . Years of Education: N/A   Occupational History  . retired    Social History Main Topics  . Smoking status: Former Smoker -- 0.50 packs/day for 35 years    Quit date: 05/20/2003  . Smokeless tobacco: Never Used  . Alcohol Use: 3.6 oz/week    6 Glasses of wine per week     Comment: occasional  . Drug Use: No  . Sexual Activity: Not on file   Other Topics Concern  . Not on file   Social History Narrative    Past Medical History  Diagnosis Date  . Allergy   . Hypertension   . Hyperlipidemia   . Cancer ([place]) 09/2014    Skin Cancer     Patient Active Problem List   Diagnosis Date Noted  . 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 Allergic rhinitis 09/20/2014  . Airway hyperreactivity  09/20/2014  . Colitis 09/20/2014  . Dermatitis, eczematoid 09/20/2014  . Elevated hemoglobin ([place]) 09/20/2014  . Calcium blood increased 09/20/2014  . Blood glucose elevated 09/20/2014  . BP (high blood pressure) 09/20/2014  . Hypertriglyceridemia 09/20/2014    Past Surgical History  Procedure Laterality Date  . Abdominal hysterectomy  1989    due to menorrhagia  . Dilation and curettage of uterus  1977  . Wrist arthroplasty Left 1991  . Trigger finger release Right 2000  . Skin cancer excision  10/2014    on chest; Dr. Evorn Gong    Her family history includes Arthritis in her mother; Breast cancer (age of onset: 18) in her paternal aunt; COPD in her father; Cancer in her brother, brother, and father; Cerebrovascular Disease in her mother; Heart disease in her mother; Hypertension in her father.    Current Meds  Medication Sig  . Calcium-Vitamin D 600-200 MG-UNIT per tablet Take by mouth.  . cetirizine (ZYRTEC) 10 MG tablet TAKE 1 TABLET BY MOUTH ONCE A DAY  . clobetasol cream (TEMOVATE) 0.05 %   . diclofenac sodium (VOLTAREN) 1 % GEL Apply 4 g topically 4 (four) times daily. (Patient taking differently: Apply 4 g topically as needed. )  . DULoxetine (CYMBALTA) 30 MG capsule TAKE ONE CAPSULE BY MOUTH DAILY AS NEEDED  . fluticasone (FLONASE) 50 MCG/ACT nasal spray Place 2 sprays into the nose as needed.   . Fluticasone-Salmeterol (ADVAIR DISKUS) 250-50 MCG/DOSE AEPB  INHALE ONE PUFF INTO LUNGS EVERY 12 HOURS TWICE A DAY. RINSE MOUTH AFTER USE. (Patient taking differently: 1 puff as needed. INHALE ONE PUFF INTO LUNGS EVERY 12 HOURS TWICE A DAY. RINSE MOUTH AFTER USE.)  . folic acid (FOLVITE) 1 MG tablet Take 1 mg by mouth daily.   . hydrochlorothiazide (HYDRODIURIL) 12.5 MG tablet Take 1 tablet (12.5 mg total) by mouth daily.  Marland Kitchen losartan (COZAAR) 100 MG tablet Take 1 tablet (100 mg total) by mouth daily.  . methotrexate 2.5 MG tablet Take 5 tablets by mouth once a week.  . metoprolol  succinate (TOPROL-XL) 50 MG 24 hr tablet TAKE 1 TABLET BY MOUTH TWICE A DAY  . montelukast (SINGULAIR) 10 MG tablet TAKE ONE TABLET BY MOUTH EVERY DAY  . Multiple Vitamin tablet Take by mouth.    Patient Care Team: Margarita Rana, MD as PCP - General (Family Medicine)    Objective:   Vitals: BP 110/70 mmHg  Pulse 64  Temp(Src) 97.7 F (36.5 C) (Oral)  Resp 16  Ht '5\' 6"'$  (1.676 m)  Wt 200 lb (90.719 kg)  BMI 32.30 kg/m2  Physical Exam  Constitutional: She is oriented to person, place, and time. She appears well-developed and well-nourished.  HENT:  Head: Normocephalic and atraumatic.  Right Ear: Tympanic membrane, external ear and ear canal normal.  Left Ear: Tympanic membrane, external ear and ear canal normal.  Nose: Nose normal.  Mouth/Throat: Uvula is midline, oropharynx is clear and moist and mucous membranes are normal.  Eyes: Conjunctivae, EOM and lids are normal. Pupils are equal, round, and reactive to light.  Neck: Trachea normal and normal range of motion. Neck supple. Carotid bruit is not present. No thyroid mass and no thyromegaly present.  Cardiovascular: Normal rate, regular rhythm and normal heart sounds.   Slight systolic ejection murmur  Pulmonary/Chest: Effort normal and breath sounds normal.  Abdominal: Soft. Normal appearance and bowel sounds are normal. There is no hepatosplenomegaly. There is no tenderness.  Genitourinary: No breast swelling, tenderness or discharge.  Musculoskeletal: Normal range of motion.  Lymphadenopathy:    She has no cervical adenopathy.    She has no axillary adenopathy.  Neurological: She is alert and oriented to person, place, and time. She has normal strength. No cranial nerve deficit.  Skin: Skin is warm, dry and intact.  Psychiatric: She has a normal mood and affect. Her speech is normal and behavior is normal. Judgment and thought content normal. Cognition and memory are normal.    Activities of Daily Living In your  present state of health, do you have any difficulty performing the following activities: 11/01/2015  Hearing? Y  Vision? Y  Difficulty concentrating or making decisions? N  Walking or climbing stairs? N  Dressing or bathing? N  Doing errands, shopping? N    Fall Risk Assessment Fall Risk  11/01/2015 10/30/2014  Falls in the past year? Yes No  Number falls in past yr: 1 -  Injury with Fall? Yes -     Depression Screen PHQ 2/9 Scores 11/01/2015 10/30/2014  PHQ - 2 Score 0 0    Cognitive Testing - 6-CIT  Correct? Score   What year is it? yes 0 0 or 4  What month is it? yes 0 0 or 3  Memorize:    Pia Mau,  41,  [place],      What time is it? (within 1 hour) yes 0 0 or 3  Count backwards from 20 yes  0 0, 2, or 4  Name the months of the year yes 0 0, 2, or 4  Repeat name & address above yes 0 0, 2, 4, 6, 8, or 10       TOTAL SCORE  0/28   Interpretation:  Normal  Normal (0-7) Abnormal (8-28)       Assessment & Plan:     Annual Wellness Visit  Reviewed patient's Family Medical History Reviewed and updated list of patient's medical providers Assessment of cognitive impairment was done Assessed patient's functional ability Established a written schedule for health screening [place] Completed and Reviewed  Exercise Activities and Dietary recommendations Goals    . Exercise 150 minutes per week (moderate activity)       Immunization History  Administered Date(s) Administered  . Influenza-Unspecified 01/18/2015  . Pneumococcal Conjugate-13 07/04/2013  . Pneumococcal Polysaccharide-23 09/30/2011  . Td 09/21/2008   ------------------------------------------------------------------------------------------------------------  1. Medicare annual wellness visit, subsequent Stable. Patient advised to continue eating healthy and exercise daily. Patient reports that she will follow-up with [facility].  2. Essential hypertension -  CBC with Differential/Platelet - Comprehensive metabolic panel - metoprolol succinate (TOPROL-XL) 50 MG 24 hr tablet; Take 1 tablet (50 mg total) by mouth 2 (two) times daily. Take with or immediately following a meal.  Dispense: 180 tablet; Refill: 3 - losartan (COZAAR) 100 MG tablet; Take 1 tablet (100 mg total) by mouth daily.  Dispense: 90 tablet; Refill: 3 - hydrochlorothiazide (HYDRODIURIL) 12.5 MG tablet; Take 1 tablet (12.5 mg total) by mouth daily.  Dispense: 90 tablet; Refill: 3  3. Blood glucose elevated - Hemoglobin A1c  4. Hypertriglyceridemia - Lipid Panel With LDL/HDL Ratio - TSH  5. Postmenopausal - DG Bone Density; Future  6. Allergic rhinitis, unspecified allergic rhinitis type - montelukast (SINGULAIR) 10 MG tablet; Take 1 tablet (10 mg total) by mouth daily.  Dispense: 90 tablet; Refill: 3  7. Adjustment disorder with other symptom - DULoxetine (CYMBALTA) 30 MG capsule; TAKE ONE CAPSULE BY MOUTH DAILY AS NEEDED  Dispense: 90 capsule; Refill: 3  8. Airway hyperreactivity, unspecified asthma severity, uncomplicated - Fluticasone-Salmeterol (ADVAIR DISKUS) 250-50 MCG/DOSE AEPB; INHALE ONE PUFF INTO LUNGS EVERY 12 HOURS TWICE A DAY. RINSE MOUTH AFTER USE.  Dispense: 60 each; Refill: 5  9. Postmenopausal - DG Bone Density; Future    Patient seen and examined by Dr. Jerrell Belfast, and note scribed by Philbert Riser. Dimas, CMA.  I have reviewed the document for accuracy and completeness and I agree with above. - Jerrell Belfast, MD   Margarita Rana, MD  [facility]

## 2015-11-05 ENCOUNTER — Ambulatory Visit
Admission: RE | Admit: 2015-11-05 | Discharge: 2015-11-05 | Disposition: A | Discharge status: Home / Self Care | Payer: Medicare Other | Source: Ambulatory Visit | Attending: Family Medicine | Admitting: Family Medicine

## 2015-11-05 ENCOUNTER — Other Ambulatory Visit: Payer: Self-pay | Admitting: Family Medicine

## 2015-11-05 DIAGNOSIS — Z1231 Encounter for screening mammogram for malignant neoplasm of breast: Secondary | ICD-10-CM | POA: Insufficient documentation

## 2015-11-06 ENCOUNTER — Telehealth: Payer: Self-pay

## 2015-11-06 LAB — COMPREHENSIVE METABOLIC PANEL
ALK PHOS: 79 IU/L (ref 39–117)
ALT: 18 IU/L (ref 0–32)
AST: 19 IU/L (ref 0–40)
Albumin/Globulin Ratio: 1.6 (ref 1.2–2.2)
Albumin: 4.1 g/dL (ref 3.5–4.8)
BUN / CREAT RATIO: 20 (ref 12–28)
BUN: 12 mg/dL (ref 8–27)
Bilirubin Total: 0.5 mg/dL (ref 0.0–1.2)
CALCIUM: 10.2 mg/dL (ref 8.7–10.3)
CO2: 24 mmol/L (ref 18–29)
CREATININE: 0.6 mg/dL (ref 0.57–1.00)
Chloride: 101 mmol/L (ref 96–106)
GFR, EST AFRICAN AMERICAN: 106 mL/min/{1.73_m2} (ref >59)
GFR, EST NON AFRICAN AMERICAN: 92 mL/min/{1.73_m2} (ref >59)
GLOBULIN, TOTAL: 2.6 g/dL (ref 1.5–4.5)
Glucose: 82 mg/dL (ref 65–99)
Potassium: 4.5 mmol/L (ref 3.5–5.2)
SODIUM: 142 mmol/L (ref 134–144)
TOTAL PROTEIN: 6.7 g/dL (ref 6.0–8.5)

## 2015-11-06 LAB — CBC WITH DIFFERENTIAL/PLATELET
BASOS: 1 %
Basophils Absolute: 0.1 10*3/uL (ref 0.0–0.2)
EOS (ABSOLUTE): 0.2 10*3/uL (ref 0.0–0.4)
EOS: 2 %
HEMATOCRIT: 43.8 % (ref 34.0–46.6)
Hemoglobin: 14.6 g/dL (ref 11.1–15.9)
IMMATURE GRANS (ABS): 0 10*3/uL (ref 0.0–0.1)
IMMATURE GRANULOCYTES: 0 %
Lymphocytes Absolute: 2.3 10*3/uL (ref 0.7–3.1)
Lymphs: 26 %
MCH: 32.3 pg (ref 26.6–33.0)
MCHC: 33.3 g/dL (ref 31.5–35.7)
MCV: 97 fL (ref 79–97)
MONOS ABS: 0.5 10*3/uL (ref 0.1–0.9)
Monocytes: 5 %
NEUTROS PCT: 66 %
Neutrophils Absolute: 5.8 10*3/uL (ref 1.4–7.0)
Platelets: 236 10*3/uL (ref 150–379)
RBC: 4.52 x10E6/uL (ref 3.77–5.28)
RDW: 13.8 % (ref 12.3–15.4)
WBC: 8.8 10*3/uL (ref 3.4–10.8)

## 2015-11-06 LAB — LIPID PANEL WITH LDL/HDL RATIO
CHOLESTEROL TOTAL: 150 mg/dL (ref 100–199)
HDL: 53 mg/dL (ref >39)
LDL CALC: 77 mg/dL (ref 0–99)
LDL/HDL RATIO: 1.5 ratio (ref 0.0–3.2)
Triglycerides: 98 mg/dL (ref 0–149)
VLDL CHOLESTEROL CAL: 20 mg/dL (ref 5–40)

## 2015-11-06 LAB — HEMOGLOBIN A1C
Est. average glucose Bld gHb Est-mCnc: 103 mg/dL
Hgb A1c MFr Bld: 5.2 % (ref 4.8–5.6)

## 2015-11-06 LAB — TSH: TSH: 2.6 u[IU]/mL (ref 0.450–4.500)

## 2015-11-06 NOTE — Telephone Encounter (Signed)
LMTCB 11/06/2015  Thanks,   -Mickel Baas

## 2015-11-06 NOTE — Telephone Encounter (Signed)
-----   Message from Margarita Rana, MD sent at 11/06/2015  6:50 AM EDT ----- Labs stable. Please notify patient. Thanks.

## 2015-11-08 NOTE — Telephone Encounter (Signed)
Pt advised.   Thanks,   -Noland Pizano  

## 2015-12-05 ENCOUNTER — Ambulatory Visit
Admission: RE | Admit: 2015-12-05 | Discharge: 2015-12-05 | Disposition: A | Discharge status: Home / Self Care | Payer: Medicare Other | Source: Ambulatory Visit | Attending: Family Medicine | Admitting: Family Medicine

## 2015-12-05 DIAGNOSIS — Z8262 Family history of osteoporosis: Secondary | ICD-10-CM | POA: Diagnosis not present

## 2015-12-05 DIAGNOSIS — Z780 Asymptomatic menopausal state: Secondary | ICD-10-CM | POA: Diagnosis not present

## 2015-12-05 DIAGNOSIS — Z13820 Encounter for screening for osteoporosis: Secondary | ICD-10-CM | POA: Diagnosis present

## 2015-12-05 DIAGNOSIS — M85851 Other specified disorders of bone density and structure, right thigh: Secondary | ICD-10-CM | POA: Insufficient documentation

## 2015-12-21 ENCOUNTER — Telehealth: Payer: Self-pay

## 2015-12-21 NOTE — Telephone Encounter (Signed)
Pt advised.   Thanks,   -Laura  

## 2015-12-21 NOTE — Telephone Encounter (Signed)
-----   Message from Birdie Sons, MD sent at 12/20/2015  9:49 PM EDT ----- Bone density test shows thinning of the bones, but not osteoporotic. Repeat bone density test in 3 years.

## 2015-12-21 NOTE — Telephone Encounter (Signed)
Pt is returning call.  CB#[phone]/MW

## 2015-12-21 NOTE — Telephone Encounter (Signed)
LMTCB 12/21/2015  Thanks,   -Mickel Baas

## 2016-02-25 ENCOUNTER — Encounter: Payer: Self-pay | Admitting: Physician Assistant

## 2016-02-25 ENCOUNTER — Ambulatory Visit (INDEPENDENT_AMBULATORY_CARE_PROVIDER_SITE_OTHER): Payer: Medicare Other | Admitting: Physician Assistant

## 2016-02-25 VITALS — BP 140/78 | HR 65 | Temp 98.2°F | Resp 16 | Wt 191.8 lb

## 2016-02-25 DIAGNOSIS — I071 Rheumatic tricuspid insufficiency: Secondary | ICD-10-CM | POA: Diagnosis not present

## 2016-02-25 DIAGNOSIS — I10 Essential (primary) hypertension: Secondary | ICD-10-CM

## 2016-02-25 DIAGNOSIS — I340 Nonrheumatic mitral (valve) insufficiency: Secondary | ICD-10-CM | POA: Diagnosis not present

## 2016-02-25 NOTE — Patient Instructions (Signed)
May add Mucinex for congestion  DASH Eating Plan DASH stands for "Dietary Approaches to Stop Hypertension." The DASH eating plan is a healthy eating plan that has been shown to reduce high blood pressure (hypertension). Additional health benefits may include reducing the risk of type 2 diabetes mellitus, heart disease, and stroke. The DASH eating plan may also help with weight loss. WHAT DO I NEED TO KNOW ABOUT THE DASH EATING PLAN? For the DASH eating plan, you will follow these general guidelines:  Choose foods with a percent daily value for sodium of less than 5% (as listed on the food label).  Use salt-free seasonings or herbs instead of table salt or sea salt.  Check with your health care provider or pharmacist before using salt substitutes.  Eat lower-sodium products, often labeled as "lower sodium" or "no salt added."  Eat fresh foods.  Eat more vegetables, fruits, and low-fat dairy products.  Choose whole grains. Look for the word "whole" as the first word in the ingredient list.  Choose fish and skinless chicken or [place] more often than red meat. Limit fish, poultry, and meat to 6 oz (170 g) each day.  Limit sweets, desserts, sugars, and sugary drinks.  Choose heart-healthy fats.  Limit cheese to 1 oz (28 g) per day.  Eat more home-cooked food and less restaurant, buffet, and fast food.  Limit fried foods.  Cook foods using methods other than frying.  Limit canned vegetables. If you do use them, rinse them well to decrease the sodium.  When eating at a restaurant, ask that your food be prepared with less salt, or no salt if possible. WHAT FOODS CAN I EAT? Seek help from a dietitian for individual calorie needs. Grains Whole grain or whole wheat bread. Brown rice. Whole grain or whole wheat pasta. Quinoa, bulgur, and whole grain cereals. Low-sodium cereals. Corn or whole wheat flour tortillas. Whole grain cornbread. Whole grain crackers. Low-sodium  crackers. Vegetables Fresh or frozen vegetables (raw, steamed, roasted, or grilled). Low-sodium or reduced-sodium tomato and vegetable juices. Low-sodium or reduced-sodium tomato sauce and paste. Low-sodium or reduced-sodium canned vegetables.  Fruits All fresh, canned (in natural juice), or frozen fruits. Meat and Other Protein Products Ground beef (85% or leaner), grass-fed beef, or beef trimmed of fat. Skinless chicken or [place]. Ground chicken or [place]. Pork trimmed of fat. All fish and seafood. Eggs. Dried beans, peas, or lentils. Unsalted nuts and seeds. Unsalted canned beans. Dairy Low-fat dairy products, such as skim or 1% milk, 2% or reduced-fat cheeses, low-fat ricotta or cottage cheese, or plain low-fat yogurt. Low-sodium or reduced-sodium cheeses. Fats and Oils Tub margarines without trans fats. Light or reduced-fat mayonnaise and salad dressings (reduced sodium). Avocado. Safflower, olive, or canola oils. Natural peanut or almond butter. Other Unsalted popcorn and pretzels. The items listed above may not be a complete list of recommended foods or beverages. Contact your dietitian for more options. WHAT FOODS ARE NOT RECOMMENDED? Grains White bread. White pasta. White rice. Refined cornbread. Bagels and croissants. Crackers that contain trans fat. Vegetables Creamed or fried vegetables. Vegetables in a cheese sauce. Regular canned vegetables. Regular canned tomato sauce and paste. Regular tomato and vegetable juices. Fruits Dried fruits. Canned fruit in light or heavy syrup. Fruit juice. Meat and Other Protein Products Fatty cuts of meat. Ribs, chicken wings, bacon, sausage, bologna, salami, chitterlings, fatback, hot dogs, bratwurst, and packaged luncheon meats. Salted nuts and seeds. Canned beans with salt. Dairy Whole or 2% milk, cream, half-and-half, and cream  cheese. Whole-fat or sweetened yogurt. Full-fat cheeses or blue cheese. Nondairy creamers and whipped toppings.  Processed cheese, cheese spreads, or cheese curds. Condiments Onion and garlic salt, seasoned salt, table salt, and sea salt. Canned and packaged gravies. Worcestershire sauce. Tartar sauce. Barbecue sauce. Teriyaki sauce. Soy sauce, including reduced sodium. Steak sauce. Fish sauce. Oyster sauce. Cocktail sauce. Horseradish. Ketchup and mustard. Meat flavorings and tenderizers. Bouillon cubes. Hot sauce. Tabasco sauce. Marinades. Taco seasonings. Relishes. Fats and Oils Butter, stick margarine, lard, shortening, ghee, and bacon fat. Coconut, palm kernel, or palm oils. Regular salad dressings. Other Pickles and olives. Salted popcorn and pretzels. The items listed above may not be a complete list of foods and beverages to avoid. Contact your dietitian for more information. WHERE CAN I FIND MORE INFORMATION? National Heart, Lung, and Blood Institute: travelstabloid.com   This information is not intended to replace advice given to you by your health care provider. Make sure you discuss any questions you have with your health care provider.   Document Released: 04/24/2011 Document Revised: 05/26/2014 Document Reviewed: 03/09/2013 Elsevier Interactive Patient Education [employer].

## 2016-02-25 NOTE — Progress Notes (Signed)
Patient: Elizabeth [name] Female    DOB: Mar 07, 1946   70 y.o.   MRN: [id number] Visit Date: 02/25/2016  Today's Provider: Mar Daring, PA-C   Chief Complaint  Patient presents with  . [employer] Form  . Hypertension   Subjective:    HPI Patient is here for her [employer] Form.   Hypertension, follow-up:  BP Readings from Last 3 Encounters:  02/25/16 140/78  11/01/15 110/70  09/11/15 (!) 154/84    She was last seen for hypertension 5 months ago.  BP at that visit was 154/84. Management since that visit includes added Hydrochlorothiazide. She reports excellent compliance with treatment. She is not having side effects.  She is exercising.She is doing weight Watchers. She reports that she lost around 20 lbs.   She is adherent to low salt diet.   She is experiencing none.  Patient denies chest pain, chest pressure/discomfort, fatigue, irregular heart beat, lower extremity edema and palpitations.   Cardiovascular risk factors include advanced age (older than 62 for men, 4 for women).  Use of agents associated with hypertension: none.     Weight trend: decreasing steadily Wt Readings from Last 3 Encounters:  02/25/16 191 lb 12.8 oz (87 kg)  11/01/15 200 lb (90.7 kg)  09/11/15 206 lb (93.4 kg)    Current diet: in general, a "healthy" diet    ------------------------------------------------------------------------      Allergies  Allergen Reactions  . Furadantin  [Nitrofurantoin]   . Phenobarbital      Current Outpatient Prescriptions:  .  Calcium-Vitamin D 600-200 MG-UNIT per tablet, Take by mouth., Disp: , Rfl:  .  cetirizine (ZYRTEC) 10 MG tablet, Take 1 tablet (10 mg total) by mouth daily., Disp: 30 tablet, Rfl: 11 .  clobetasol cream (TEMOVATE) 0.05 %, , Disp: , Rfl:  .  diclofenac sodium (VOLTAREN) 1 % GEL, Apply 4 g topically 4 (four) times daily. (Patient taking differently: Apply 4 g topically as needed. ), Disp: 100 g, Rfl: 3 .  DULoxetine  (CYMBALTA) 30 MG capsule, TAKE ONE CAPSULE BY MOUTH DAILY AS NEEDED, Disp: 90 capsule, Rfl: 3 .  fluticasone (FLONASE) 50 MCG/ACT nasal spray, Place 2 sprays into both nostrils daily., Disp: 16 g, Rfl: 11 .  Fluticasone-Salmeterol (ADVAIR DISKUS) 250-50 MCG/DOSE AEPB, INHALE ONE PUFF INTO LUNGS EVERY 12 HOURS TWICE A DAY. RINSE MOUTH AFTER USE., Disp: 60 each, Rfl: 5 .  folic acid (FOLVITE) 1 MG tablet, Take 1 mg by mouth daily. , Disp: , Rfl:  .  hydrochlorothiazide (HYDRODIURIL) 12.5 MG tablet, Take 1 tablet (12.5 mg total) by mouth daily., Disp: 90 tablet, Rfl: 3 .  losartan (COZAAR) 100 MG tablet, Take 1 tablet (100 mg total) by mouth daily., Disp: 90 tablet, Rfl: 3 .  methotrexate 2.5 MG tablet, Take 5 tablets by mouth once a week., Disp: , Rfl:  .  metoprolol succinate (TOPROL-XL) 50 MG 24 hr tablet, Take 1 tablet (50 mg total) by mouth 2 (two) times daily. Take with or immediately following a meal., Disp: 180 tablet, Rfl: 3 .  montelukast (SINGULAIR) 10 MG tablet, Take 1 tablet (10 mg total) by mouth daily., Disp: 90 tablet, Rfl: 3 .  Multiple Vitamin tablet, Take by mouth., Disp: , Rfl:   Review of Systems  Constitutional: Negative.   Respiratory: Negative.   Cardiovascular: Negative.   Gastrointestinal: Negative.   Neurological: Negative.     Social History  Substance Use Topics  . Smoking status: Former Smoker  Packs/day: 0.50    Years: 35.00    Quit date: 05/20/2003  . Smokeless tobacco: Never Used  . Alcohol use 3.6 oz/week    6 Glasses of wine per week     Comment: occasional   Objective:   BP 140/78 (BP Location: Right Arm, Patient Position: Sitting, Cuff Size: Normal)   Pulse 65   Temp 98.2 F (36.8 C) (Oral)   Resp 16   Wt 191 lb 12.8 oz (87 kg)   SpO2 97%   BMI 30.96 kg/m   Physical Exam  Constitutional: She appears well-developed and well-nourished. No distress.  Neck: Normal range of motion. Neck supple. No tracheal deviation present. No thyromegaly  present.  Cardiovascular: Normal rate and regular rhythm.  Exam reveals no gallop and no friction rub.   Murmur heard.  Systolic murmur is present with a grade of 2/6  Pulmonary/Chest: Effort normal and breath sounds normal. No respiratory distress. She has no wheezes. She has no rales.  Musculoskeletal: She exhibits no edema.  Lymphadenopathy:    She has no cervical adenopathy.  Skin: She is not diaphoretic.  Vitals reviewed.     Assessment & Plan:     1. Essential hypertension Stable. Continue current medical treatment plan of Metoprolol 50 mg twice daily, losartan 100 mg daily and hydrochlorothiazide 12.5 mg daily. Blood pressure today was slightly elevated compared to previous but patient does admit that she did not take her hydrochlorothiazide this morning due to errands she had to run and it causing her urinary frequency.  2. Tricuspid valve insufficiency, unspecified etiology Murmur had been heard at previous physical and patient was sent for cardiology eval. Echocardiogram revealed tricuspid valve regurgitation, mild and mitral valve regurgitation trivial. Patient is stable and no change in murmur. Will continue to monitor.  3. Mitral valve insufficiency, unspecified etiology See above medical treatment plan.        Mar Daring, PA-C  [facility]

## 2016-03-20 ENCOUNTER — Ambulatory Visit (INDEPENDENT_AMBULATORY_CARE_PROVIDER_SITE_OTHER): Payer: Medicare Other | Admitting: Physician Assistant

## 2016-03-20 ENCOUNTER — Ambulatory Visit
Admission: RE | Admit: 2016-03-20 | Discharge: 2016-03-20 | Disposition: A | Discharge status: Home / Self Care | Payer: Medicare Other | Source: Ambulatory Visit | Attending: Physician Assistant | Admitting: Physician Assistant

## 2016-03-20 ENCOUNTER — Encounter: Payer: Self-pay | Admitting: Physician Assistant

## 2016-03-20 ENCOUNTER — Telehealth: Payer: Self-pay

## 2016-03-20 VITALS — BP 110/70 | HR 66 | Temp 97.9°F | Resp 16 | Wt 194.2 lb

## 2016-03-20 DIAGNOSIS — R05 Cough: Secondary | ICD-10-CM | POA: Insufficient documentation

## 2016-03-20 DIAGNOSIS — R062 Wheezing: Secondary | ICD-10-CM

## 2016-03-20 DIAGNOSIS — R0989 Other specified symptoms and signs involving the circulatory and respiratory systems: Secondary | ICD-10-CM | POA: Diagnosis not present

## 2016-03-20 DIAGNOSIS — J40 Bronchitis, not specified as acute or chronic: Secondary | ICD-10-CM

## 2016-03-20 DIAGNOSIS — R059 Cough, unspecified: Secondary | ICD-10-CM

## 2016-03-20 MED ORDER — LEVOFLOXACIN 500 MG PO TABS: 500.0000 mg | ORAL_TABLET | Freq: Every day | ORAL | 0 refills | Status: DC

## 2016-03-20 MED ORDER — PREDNISONE 10 MG PO TABS: ORAL_TABLET | ORAL | 0 refills | Status: DC

## 2016-03-20 NOTE — Patient Instructions (Signed)

## 2016-03-20 NOTE — Telephone Encounter (Signed)
Patient has been advised. KW 

## 2016-03-20 NOTE — Telephone Encounter (Signed)
-----   Message from Mar Daring, [place] sent at 03/20/2016  1:31 PM EDT ----- CXR is normal

## 2016-03-20 NOTE — Progress Notes (Signed)
Patient: Elizabeth [name] Female    DOB: 07-10-1945   70 y.o.   MRN: [id number] Visit Date: 03/20/2016  Today's Provider: Mar Daring, PA-C   Chief Complaint  Patient presents with  . Cough   Subjective:    HPI Patient is here with c/o of cough not getting better. She is almost done with Levofloxacin and Prednisone. She reports that she has a lot of cough and congestion, wheezing and runny nose. She is doing her Advair, nasal spray and Zyrtec. She took Naprosyn for a couple of days because she was feeling warmed but never check temperature. She denies SOB or chest tightness. She has been around sick contacts.    Allergies  Allergen Reactions  . Furadantin  [Nitrofurantoin]   . Phenobarbital      Current Outpatient Prescriptions:  .  Calcium-Vitamin D 600-200 MG-UNIT per tablet, Take by mouth., Disp: , Rfl:  .  cetirizine (ZYRTEC) 10 MG tablet, Take 1 tablet (10 mg total) by mouth daily., Disp: 30 tablet, Rfl: 11 .  clobetasol cream (TEMOVATE) 0.05 %, , Disp: , Rfl:  .  DULoxetine (CYMBALTA) 30 MG capsule, TAKE ONE CAPSULE BY MOUTH DAILY AS NEEDED, Disp: 90 capsule, Rfl: 3 .  fluticasone (FLONASE) 50 MCG/ACT nasal spray, Place 2 sprays into both nostrils daily., Disp: 16 g, Rfl: 11 .  Fluticasone-Salmeterol (ADVAIR DISKUS) 250-50 MCG/DOSE AEPB, INHALE ONE PUFF INTO LUNGS EVERY 12 HOURS TWICE A DAY. RINSE MOUTH AFTER USE., Disp: 60 each, Rfl: 5 .  folic acid (FOLVITE) 1 MG tablet, Take 1 mg by mouth daily. , Disp: , Rfl:  .  hydrochlorothiazide (HYDRODIURIL) 12.5 MG tablet, Take 1 tablet (12.5 mg total) by mouth daily., Disp: 90 tablet, Rfl: 3 .  losartan (COZAAR) 100 MG tablet, Take 1 tablet (100 mg total) by mouth daily., Disp: 90 tablet, Rfl: 3 .  methotrexate 2.5 MG tablet, Take 5 tablets by mouth once a week., Disp: , Rfl:  .  metoprolol succinate (TOPROL-XL) 50 MG 24 hr tablet, Take 1 tablet (50 mg total) by mouth 2 (two) times daily. Take with or immediately  following a meal., Disp: 180 tablet, Rfl: 3 .  montelukast (SINGULAIR) 10 MG tablet, Take 1 tablet (10 mg total) by mouth daily., Disp: 90 tablet, Rfl: 3 .  Multiple Vitamin tablet, Take by mouth., Disp: , Rfl:  .  Multiple Vitamins-Minerals (PRESERVISION AREDS PO), Take by mouth., Disp: , Rfl:  .  diclofenac sodium (VOLTAREN) 1 % GEL, Apply 4 g topically 4 (four) times daily. (Patient not taking: Reported on 03/20/2016), Disp: 100 g, Rfl: 3  Review of Systems  Constitutional: Positive for fatigue.  HENT: Positive for congestion, rhinorrhea and sinus pressure.   Respiratory: Positive for cough and wheezing. Negative for chest tightness and shortness of breath.   Cardiovascular: Negative for chest pain, palpitations and leg swelling.  Gastrointestinal: Negative.   Neurological: Negative for dizziness and headaches.    Social History  Substance Use Topics  . Smoking status: Former Smoker    Packs/day: 0.50    Years: 35.00    Quit date: 05/20/2003  . Smokeless tobacco: Never Used  . Alcohol use 3.6 oz/week    6 Glasses of wine per week     Comment: occasional   Objective:   BP 110/70 (BP Location: Right Arm, Patient Position: Sitting, Cuff Size: Normal)   Pulse 66   Temp 97.9 F (36.6 C) (Oral)   Resp 16  Wt 194 lb 3.2 oz (88.1 kg)   SpO2 98%   BMI 31.34 kg/m   Physical Exam  Constitutional: She appears well-developed and well-nourished. No distress.  HENT:  Head: Normocephalic and atraumatic.  Right Ear: Hearing, external ear and ear canal normal. Tympanic membrane is not erythematous and not bulging. A middle ear effusion is present.  Left Ear: Hearing, tympanic membrane, external ear and ear canal normal.  Nose: Nose normal. Right sinus exhibits no maxillary sinus tenderness and no frontal sinus tenderness. Left sinus exhibits no maxillary sinus tenderness and no frontal sinus tenderness.  Mouth/Throat: Uvula is midline, oropharynx is clear and moist and mucous membranes are  normal. No oropharyngeal exudate or posterior oropharyngeal edema.  Eyes: Conjunctivae are normal. Pupils are equal, round, and reactive to light. Right eye exhibits no discharge. Left eye exhibits no discharge. No scleral icterus.  Neck: Normal range of motion. Neck supple. No tracheal deviation present. No thyromegaly present.  Cardiovascular: Normal rate and regular rhythm.  Exam reveals no gallop and no friction rub.   Murmur heard.  Systolic murmur is present with a grade of 2/6  Pulmonary/Chest: Effort normal. No stridor. No respiratory distress. She has no decreased breath sounds. She has wheezes (throughout). She has no rhonchi. She has rales in the left lower field. She exhibits no tenderness.  Lymphadenopathy:    She has no cervical adenopathy.  Skin: Skin is warm and dry. She is not diaphoretic.  Vitals reviewed.     Assessment & Plan:     1. Bronchitis Worsening symptoms. Will give levaquin and prednisone as below. Will also get a CXR because of rales heard faintly in LLL. Will f/u pending results of CXR. She is to call if no improvement. - levofloxacin (LEVAQUIN) 500 MG tablet; Take 1 tablet (500 mg total) by mouth daily.  Dispense: 10 tablet; Refill: 0 - predniSONE (DELTASONE) 10 MG tablet; Take 6 tabs PO on day 1&2, 5 tabs PO on day 3&4, 4 tabs PO on day 5&6, 3 tabs PO on day 7&8, 2 tabs PO on day 9&10, 1 tab PO on day 11&12.  Dispense: 42 tablet; Refill: 0  2. Cough See above medical treatment plan. - DG Chest 2 View; Future  3. Chest rales Heard faintly in LLL. Will f/u pending results.  - DG Chest 2 View; Future  4. Wheezes Heard throughout faintly.  - DG Chest 2 View; Future       Mar Daring, PA-C  [facility]

## 2016-04-03 ENCOUNTER — Ambulatory Visit (INDEPENDENT_AMBULATORY_CARE_PROVIDER_SITE_OTHER): Payer: Medicare Other | Admitting: Physician Assistant

## 2016-04-03 ENCOUNTER — Encounter: Payer: Self-pay | Admitting: Physician Assistant

## 2016-04-03 VITALS — BP 90/60 | HR 60 | Temp 98.1°F | Resp 16 | Wt 191.8 lb

## 2016-04-03 DIAGNOSIS — J40 Bronchitis, not specified as acute or chronic: Secondary | ICD-10-CM

## 2016-04-03 DIAGNOSIS — J0140 Acute pansinusitis, unspecified: Secondary | ICD-10-CM

## 2016-04-03 MED ORDER — DOXYCYCLINE HYCLATE 100 MG PO TABS: 100.0000 mg | ORAL_TABLET | Freq: Two times a day (BID) | ORAL | 0 refills | Status: DC

## 2016-04-03 MED ORDER — PREDNISONE 10 MG PO TABS: ORAL_TABLET | ORAL | 0 refills | Status: DC

## 2016-04-03 NOTE — Patient Instructions (Signed)

## 2016-04-03 NOTE — Progress Notes (Signed)
Patient: Elizabeth [name] Female    DOB: 18-May-1946   70 y.o.   MRN: [id number] Visit Date: 04/03/2016  Today's Provider: Mar Daring, PA-C   Chief Complaint  Patient presents with  . Sinusitis   Subjective:    HPI Patient is here for non-resolving sinus complaints.  She was seen on 11/02 for worsening bronchitis and was started on Prednisone and Levaquin. CXR was obtained and came back normal. She reports she still has the persistent cough, sinuses are still draining. She reports that the last time she developed a sinus infection like this it took a couple rounds of antibiotic to clear. She is concerned that with Thanksgiving around the corner she will not be able to be around her family if she can't get to feeling better.      Allergies  Allergen Reactions  . Furadantin  [Nitrofurantoin]   . Phenobarbital      Current Outpatient Prescriptions:  .  Calcium-Vitamin D 600-200 MG-UNIT per tablet, Take by mouth., Disp: , Rfl:  .  cetirizine (ZYRTEC) 10 MG tablet, Take 1 tablet (10 mg total) by mouth daily., Disp: 30 tablet, Rfl: 11 .  clobetasol cream (TEMOVATE) 0.05 %, , Disp: , Rfl:  .  DULoxetine (CYMBALTA) 30 MG capsule, TAKE ONE CAPSULE BY MOUTH DAILY AS NEEDED, Disp: 90 capsule, Rfl: 3 .  fluticasone (FLONASE) 50 MCG/ACT nasal spray, Place 2 sprays into both nostrils daily., Disp: 16 g, Rfl: 11 .  Fluticasone-Salmeterol (ADVAIR DISKUS) 250-50 MCG/DOSE AEPB, INHALE ONE PUFF INTO LUNGS EVERY 12 HOURS TWICE A DAY. RINSE MOUTH AFTER USE., Disp: 60 each, Rfl: 5 .  folic acid (FOLVITE) 1 MG tablet, Take 1 mg by mouth daily. , Disp: , Rfl:  .  hydrochlorothiazide (HYDRODIURIL) 12.5 MG tablet, Take 1 tablet (12.5 mg total) by mouth daily., Disp: 90 tablet, Rfl: 3 .  losartan (COZAAR) 100 MG tablet, Take 1 tablet (100 mg total) by mouth daily., Disp: 90 tablet, Rfl: 3 .  methotrexate 2.5 MG tablet, Take 5 tablets by mouth once a week., Disp: , Rfl:  .  metoprolol  succinate (TOPROL-XL) 50 MG 24 hr tablet, Take 1 tablet (50 mg total) by mouth 2 (two) times daily. Take with or immediately following a meal., Disp: 180 tablet, Rfl: 3 .  montelukast (SINGULAIR) 10 MG tablet, Take 1 tablet (10 mg total) by mouth daily., Disp: 90 tablet, Rfl: 3 .  Multiple Vitamin tablet, Take by mouth., Disp: , Rfl:  .  Multiple Vitamins-Minerals (PRESERVISION AREDS PO), Take by mouth., Disp: , Rfl:  .  diclofenac sodium (VOLTAREN) 1 % GEL, Apply 4 g topically 4 (four) times daily. (Patient not taking: Reported on 04/03/2016), Disp: 100 g, Rfl: 3 .  levofloxacin (LEVAQUIN) 500 MG tablet, Take 1 tablet (500 mg total) by mouth daily. (Patient not taking: Reported on 04/03/2016), Disp: 10 tablet, Rfl: 0 .  predniSONE (DELTASONE) 10 MG tablet, Take 6 tabs PO on day 1&2, 5 tabs PO on day 3&4, 4 tabs PO on day 5&6, 3 tabs PO on day 7&8, 2 tabs PO on day 9&10, 1 tab PO on day 11&12. (Patient not taking: Reported on 04/03/2016), Disp: 42 tablet, Rfl: 0  Review of Systems  Constitutional: Negative.   HENT: Positive for congestion, postnasal drip, rhinorrhea, sinus pain, sinus pressure and sore throat. Negative for ear pain, sneezing and trouble swallowing.   Eyes: Negative.   Respiratory: Positive for cough. Negative for chest tightness, shortness  of breath and wheezing.   Cardiovascular: Negative.   Gastrointestinal: Negative for abdominal pain and nausea.  Neurological: Positive for headaches. Negative for dizziness.    Social History  Substance Use Topics  . Smoking status: Former Smoker    Packs/day: 0.50    Years: 35.00    Quit date: 05/20/2003  . Smokeless tobacco: Never Used  . Alcohol use 3.6 oz/week    6 Glasses of wine per week     Comment: occasional   Objective:   BP 90/60 (BP Location: Right Arm, Patient Position: Sitting, Cuff Size: Normal)   Pulse 60   Temp 98.1 F (36.7 C) (Oral)   Resp 16   Wt 191 lb 12.8 oz (87 kg)   BMI 30.96 kg/m   Physical Exam    Constitutional: She appears well-developed and well-nourished. No distress.  HENT:  Head: Normocephalic and atraumatic.  Right Ear: Hearing, tympanic membrane, external ear and ear canal normal.  Left Ear: Hearing, tympanic membrane, external ear and ear canal normal.  Nose: Mucosal edema present. No rhinorrhea. Right sinus exhibits maxillary sinus tenderness and frontal sinus tenderness. Left sinus exhibits maxillary sinus tenderness and frontal sinus tenderness.  Mouth/Throat: Uvula is midline, oropharynx is clear and moist and mucous membranes are normal. No oropharyngeal exudate.  Neck: Normal range of motion. Neck supple. No tracheal deviation present. No thyromegaly present.  Cardiovascular: Normal rate, regular rhythm and normal heart sounds.  Exam reveals no gallop and no friction rub.   No murmur heard. Pulmonary/Chest: Effort normal and breath sounds normal. No stridor. No respiratory distress. She has no decreased breath sounds. She has no wheezes. She has no rales.  Lymphadenopathy:    She has no cervical adenopathy.  Skin: She is not diaphoretic.  Vitals reviewed.     Assessment & Plan:     1. Bronchitis Symptoms not completely resolved, lungs better. Will give another round of prednisone and switch to doxycycline from levaquin as below. She is to call if symptoms still do not resolve. If symptoms do not improve I would recommend ENT referral for sinusitis.  - predniSONE (DELTASONE) 10 MG tablet; Take 6 tabs PO on day 1&2, 5 tabs PO on day 3&4, 4 tabs PO on day 5&6, 3 tabs PO on day 7&8, 2 tabs PO on day 9&10, 1 tab PO on day 11&12.  Dispense: 42 tablet; Refill: 0 - doxycycline (VIBRA-TABS) 100 MG tablet; Take 1 tablet (100 mg total) by mouth 2 (two) times daily.  Dispense: 14 tablet; Refill: 0  2. Subacute pansinusitis See above medical treatment plan.       Mar Daring, PA-C  [facility]

## 2016-07-09 ENCOUNTER — Ambulatory Visit (INDEPENDENT_AMBULATORY_CARE_PROVIDER_SITE_OTHER): Payer: Medicare Other | Admitting: Physician Assistant

## 2016-07-09 ENCOUNTER — Encounter: Payer: Self-pay | Admitting: Physician Assistant

## 2016-07-09 VITALS — BP 120/80 | HR 66 | Temp 98.2°F | Resp 16 | Ht 65.5 in | Wt 198.0 lb

## 2016-07-09 DIAGNOSIS — J40 Bronchitis, not specified as acute or chronic: Secondary | ICD-10-CM | POA: Diagnosis not present

## 2016-07-09 DIAGNOSIS — I10 Essential (primary) hypertension: Secondary | ICD-10-CM | POA: Diagnosis not present

## 2016-07-09 DIAGNOSIS — Z20828 Contact with and (suspected) exposure to other viral communicable diseases: Secondary | ICD-10-CM | POA: Diagnosis not present

## 2016-07-09 MED ORDER — LEVOFLOXACIN 500 MG PO TABS: 500.0000 mg | ORAL_TABLET | Freq: Every day | ORAL | 0 refills | Status: DC

## 2016-07-09 MED ORDER — PREDNISONE 10 MG PO TABS: ORAL_TABLET | ORAL | 0 refills | Status: DC

## 2016-07-09 MED ORDER — OSELTAMIVIR PHOSPHATE 75 MG PO CAPS: 75.0000 mg | ORAL_CAPSULE | Freq: Two times a day (BID) | ORAL | 0 refills | Status: DC

## 2016-07-09 NOTE — Progress Notes (Signed)
Patient: Elizabeth [name] Female    DOB: Sep 13, 1945   71 y.o.   MRN: [id number] Visit Date: 07/09/2016  Today's Provider: Mar Daring, PA-C   No chief complaint on file.  Subjective:    HPI Patient here today requesting a prescription for antibiotic to take with her to [facility] for precaution. Patient reports that she will be staying with family and reports that her grandchildren have not had flu vaccines.    Hypertension, follow-up:  BP Readings from Last 3 Encounters:  07/09/16 120/80  04/03/16 90/60  03/20/16 110/70    She was last seen for hypertension over one year.  BP at that visit was 90/60. Management changes since that visit include no changes. She reports excellent compliance with treatment. She is not having side effects.  She is exercising. She is adherent to low salt diet.   Outside blood pressures are not being checked. She is experiencing none.  Patient denies chest pain and lower extremity edema.   Cardiovascular risk factors include advanced age (older than 67 for men, 31 for women), hypertension and obesity (BMI >= 30 kg/m2).  Use of agents associated with hypertension: none.     Weight trend: stable Wt Readings from Last 3 Encounters:  07/09/16 198 lb (89.8 kg)  04/03/16 191 lb 12.8 oz (87 kg)  03/20/16 194 lb 3.2 oz (88.1 kg)    Current diet: in general, a "healthy" diet    ------------------------------------------------------------------------     Allergies  Allergen Reactions  . Furadantin  [Nitrofurantoin]   . Phenobarbital      Current Outpatient Prescriptions:  .  Calcium-Vitamin D 600-200 MG-UNIT per tablet, Take by mouth., Disp: , Rfl:  .  cetirizine (ZYRTEC) 10 MG tablet, Take 1 tablet (10 mg total) by mouth daily., Disp: 30 tablet, Rfl: 11 .  clobetasol cream (TEMOVATE) 0.05 %, , Disp: , Rfl:  .  fluticasone (FLONASE) 50 MCG/ACT nasal spray, Place 2 sprays into both nostrils daily., Disp: 16 g, Rfl: 11 .   Fluticasone-Salmeterol (ADVAIR DISKUS) 250-50 MCG/DOSE AEPB, INHALE ONE PUFF INTO LUNGS EVERY 12 HOURS TWICE A DAY. RINSE MOUTH AFTER USE., Disp: 60 each, Rfl: 5 .  folic acid (FOLVITE) 1 MG tablet, Take 1 mg by mouth daily. , Disp: , Rfl:  .  hydrochlorothiazide (HYDRODIURIL) 12.5 MG tablet, Take 1 tablet (12.5 mg total) by mouth daily., Disp: 90 tablet, Rfl: 3 .  losartan (COZAAR) 100 MG tablet, Take 1 tablet (100 mg total) by mouth daily., Disp: 90 tablet, Rfl: 3 .  methotrexate 2.5 MG tablet, Take 5 tablets by mouth once a week., Disp: , Rfl:  .  metoprolol succinate (TOPROL-XL) 50 MG 24 hr tablet, Take 1 tablet (50 mg total) by mouth 2 (two) times daily. Take with or immediately following a meal., Disp: 180 tablet, Rfl: 3 .  montelukast (SINGULAIR) 10 MG tablet, Take 1 tablet (10 mg total) by mouth daily., Disp: 90 tablet, Rfl: 3 .  Multiple Vitamin tablet, Take by mouth., Disp: , Rfl:  .  Multiple Vitamins-Minerals (PRESERVISION AREDS PO), Take by mouth., Disp: , Rfl:  .  doxycycline (VIBRA-TABS) 100 MG tablet, Take 1 tablet (100 mg total) by mouth 2 (two) times daily. (Patient not taking: Reported on 07/09/2016), Disp: 14 tablet, Rfl: 0 .  DULoxetine (CYMBALTA) 30 MG capsule, TAKE ONE CAPSULE BY MOUTH DAILY AS NEEDED, Disp: 90 capsule, Rfl: 3  Review of Systems  Constitutional: Negative.   Respiratory: Negative.  Cardiovascular: Negative.   Gastrointestinal: Negative.   Neurological: Negative.   Psychiatric/Behavioral: Negative.     Social History  Substance Use Topics  . Smoking status: Former Smoker    Packs/day: 0.50    Years: 35.00    Quit date: 05/20/2003  . Smokeless tobacco: Never Used  . Alcohol use 3.6 oz/week    6 Glasses of wine per week     Comment: occasional   Objective:   BP 120/80 (BP Location: Left Arm, Patient Position: Sitting, Cuff Size: Large)   Pulse 66   Temp 98.2 F (36.8 C) (Oral)   Resp 16   Ht 5' 5.5" (1.664 m)   Wt 198 lb (89.8 kg)   BMI 32.45  kg/m   Physical Exam  Constitutional: She appears well-developed and well-nourished. No distress.  Neck: Normal range of motion. Neck supple.  Cardiovascular: Normal rate, regular rhythm and normal heart sounds.  Exam reveals no gallop and no friction rub.   No murmur heard. Pulmonary/Chest: Effort normal and breath sounds normal. No respiratory distress. She has no wheezes. She has no rales.  Skin: She is not diaphoretic.  Vitals reviewed.     Assessment & Plan:     1. Essential hypertension Stable. Continue metoprolol '50mg'$  BID, losartan '100mg'$  daily, HCTZ 12.'5mg'$  daily.  2. Bronchitis Medications printed for patient to carry with her to [place] while she is on vacation with her children and grandchildren. She may fill if needed.  - predniSONE (DELTASONE) 10 MG tablet; Take 6 tabs PO on day 1&2, 5 tabs PO on day 3&4, 4 tabs PO on day 5&6, 3 tabs PO on day 7&8, 2 tabs PO on day 9&10, 1 tab PO on day 11&12.  Dispense: 42 tablet; Refill: 0 - levofloxacin (LEVAQUIN) 500 MG tablet; Take 1 tablet (500 mg total) by mouth daily.  Dispense: 10 tablet; Refill: 0  2. Exposure to the flu Tamiflu also given to patient to carry with her on her vacation since her kids and grandkids have not had the flu vaccine and could expose her. She is going to be out of town for 2-3 weeks on this vacation.  - oseltamivir (TAMIFLU) 75 MG capsule; Take 1 capsule (75 mg total) by mouth 2 (two) times daily.  Dispense: 10 capsule; Refill: 0       Mar Daring, PA-C  [facility]

## 2016-07-09 NOTE — Patient Instructions (Signed)
DASH Eating Plan DASH stands for "Dietary Approaches to Stop Hypertension." The DASH eating plan is a healthy eating plan that has been shown to reduce high blood pressure (hypertension). Additional health benefits may include reducing the risk of type 2 diabetes mellitus, heart disease, and stroke. The DASH eating plan may also help with weight loss. What do I need to know about the DASH eating plan? For the DASH eating plan, you will follow these general guidelines:  Choose foods with less than 150 milligrams of sodium per serving (as listed on the food label).  Use salt-free seasonings or herbs instead of table salt or sea salt.  Check with your health care provider or pharmacist before using salt substitutes.  Eat lower-sodium products. These are often labeled as "low-sodium" or "no salt added."  Eat fresh foods. Avoid eating a lot of canned foods.  Eat more vegetables, fruits, and low-fat dairy products.  Choose whole grains. Look for the word "whole" as the first word in the ingredient list.  Choose fish and skinless chicken or turkey more often than red meat. Limit fish, poultry, and meat to 6 oz (170 g) each day.  Limit sweets, desserts, sugars, and sugary drinks.  Choose heart-healthy fats.  Eat more home-cooked food and less restaurant, buffet, and fast food.  Limit fried foods.  Do not fry foods. Cook foods using methods such as baking, boiling, grilling, and broiling instead.  When eating at a restaurant, ask that your food be prepared with less salt, or no salt if possible. What foods can I eat? Seek help from a dietitian for individual calorie needs. Grains  Whole grain or whole wheat bread. Brown rice. Whole grain or whole wheat pasta. Quinoa, bulgur, and whole grain cereals. Low-sodium cereals. Corn or whole wheat flour tortillas. Whole grain cornbread. Whole grain crackers. Low-sodium crackers. Vegetables  Fresh or frozen vegetables (raw, steamed, roasted, or  grilled). Low-sodium or reduced-sodium tomato and vegetable juices. Low-sodium or reduced-sodium tomato sauce and paste. Low-sodium or reduced-sodium canned vegetables. Fruits  All fresh, canned (in natural juice), or frozen fruits. Meat and Other Protein Products  Ground beef (85% or leaner), grass-fed beef, or beef trimmed of fat. Skinless chicken or turkey. Ground chicken or turkey. Pork trimmed of fat. All fish and seafood. Eggs. Dried beans, peas, or lentils. Unsalted nuts and seeds. Unsalted canned beans. Dairy  Low-fat dairy products, such as skim or 1% milk, 2% or reduced-fat cheeses, low-fat ricotta or cottage cheese, or plain low-fat yogurt. Low-sodium or reduced-sodium cheeses. Fats and Oils  Tub margarines without trans fats. Light or reduced-fat mayonnaise and salad dressings (reduced sodium). Avocado. Safflower, olive, or canola oils. Natural peanut or almond butter. Other  Unsalted popcorn and pretzels. The items listed above may not be a complete list of recommended foods or beverages. Contact your dietitian for more options.  What foods are not recommended? Grains  White bread. White pasta. White rice. Refined cornbread. Bagels and croissants. Crackers that contain trans fat. Vegetables  Creamed or fried vegetables. Vegetables in a cheese sauce. Regular canned vegetables. Regular canned tomato sauce and paste. Regular tomato and vegetable juices. Fruits  Canned fruit in light or heavy syrup. Fruit juice. Meat and Other Protein Products  Fatty cuts of meat. Ribs, chicken wings, bacon, sausage, bologna, salami, chitterlings, fatback, hot dogs, bratwurst, and packaged luncheon meats. Salted nuts and seeds. Canned beans with salt. Dairy  Whole or 2% milk, cream, half-and-half, and cream cheese. Whole-fat or sweetened yogurt. Full-fat cheeses   or blue cheese. Nondairy creamers and whipped toppings. Processed cheese, cheese spreads, or cheese curds. Condiments  Onion and garlic  salt, seasoned salt, table salt, and sea salt. Canned and packaged gravies. Worcestershire sauce. Tartar sauce. Barbecue sauce. Teriyaki sauce. Soy sauce, including reduced sodium. Steak sauce. Fish sauce. Oyster sauce. Cocktail sauce. Horseradish. Ketchup and mustard. Meat flavorings and tenderizers. Bouillon cubes. Hot sauce. Tabasco sauce. Marinades. Taco seasonings. Relishes. Fats and Oils  Butter, stick margarine, lard, shortening, ghee, and bacon fat. Coconut, palm kernel, or palm oils. Regular salad dressings. Other  Pickles and olives. Salted popcorn and pretzels. The items listed above may not be a complete list of foods and beverages to avoid. Contact your dietitian for more information.  Where can I find more information? National Heart, Lung, and Blood Institute: www.nhlbi.nih.gov/health/health-topics/topics/dash/ This information is not intended to replace advice given to you by your health care provider. Make sure you discuss any questions you have with your health care provider. Document Released: 04/24/2011 Document Revised: 10/11/2015 Document Reviewed: 03/09/2013 Elsevier Interactive Patient Education  2017 Elsevier Inc.  

## 2016-10-17 ENCOUNTER — Other Ambulatory Visit: Payer: Self-pay | Admitting: Physician Assistant

## 2016-10-17 DIAGNOSIS — I10 Essential (primary) hypertension: Secondary | ICD-10-CM

## 2016-10-17 MED ORDER — LOSARTAN POTASSIUM 100 MG PO TABS: 100.0000 mg | ORAL_TABLET | Freq: Every day | ORAL | 3 refills | Status: DC

## 2016-10-17 NOTE — Telephone Encounter (Signed)
[place] faxed a refill request on the following medications:  losartan (COZAAR) 100 MG tablet.  Take 1 tablet by mouth once daily.  90 day supply.  [facility]

## 2016-11-03 ENCOUNTER — Other Ambulatory Visit: Payer: Self-pay | Admitting: Physician Assistant

## 2016-11-03 ENCOUNTER — Encounter: Payer: Medicare Other | Admitting: Physician Assistant

## 2016-11-03 MED ORDER — MONTELUKAST SODIUM 10 MG PO TABS: 10.0000 mg | ORAL_TABLET | Freq: Every day | ORAL | 3 refills | Status: DC

## 2016-11-03 NOTE — Telephone Encounter (Signed)
[place] faxed a request for the following medication. Thanks CC  montelukast (SINGULAIR) 10 MG tablet  >Take 1 tablet by mouth once daily.

## 2016-11-05 ENCOUNTER — Ambulatory Visit (INDEPENDENT_AMBULATORY_CARE_PROVIDER_SITE_OTHER): Payer: Medicare Other | Admitting: Physician Assistant

## 2016-11-05 ENCOUNTER — Encounter: Payer: Self-pay | Admitting: Physician Assistant

## 2016-11-05 VITALS — BP 126/80 | HR 76 | Temp 98.7°F | Resp 16 | Ht 66.5 in | Wt 195.6 lb

## 2016-11-05 DIAGNOSIS — F4329 Adjustment disorder with other symptoms: Secondary | ICD-10-CM

## 2016-11-05 DIAGNOSIS — Z1211 Encounter for screening for malignant neoplasm of colon: Secondary | ICD-10-CM | POA: Diagnosis not present

## 2016-11-05 DIAGNOSIS — R739 Hyperglycemia, unspecified: Secondary | ICD-10-CM

## 2016-11-05 DIAGNOSIS — E781 Pure hyperglyceridemia: Secondary | ICD-10-CM

## 2016-11-05 DIAGNOSIS — Z1231 Encounter for screening mammogram for malignant neoplasm of breast: Secondary | ICD-10-CM | POA: Diagnosis not present

## 2016-11-05 DIAGNOSIS — I10 Essential (primary) hypertension: Secondary | ICD-10-CM | POA: Diagnosis not present

## 2016-11-05 DIAGNOSIS — Z1239 Encounter for other screening for malignant neoplasm of breast: Secondary | ICD-10-CM

## 2016-11-05 DIAGNOSIS — L2084 Intrinsic (allergic) eczema: Secondary | ICD-10-CM

## 2016-11-05 DIAGNOSIS — Z0000 Encounter for general adult medical examination without abnormal findings: Secondary | ICD-10-CM

## 2016-11-05 DIAGNOSIS — D582 Other hemoglobinopathies: Secondary | ICD-10-CM

## 2016-11-05 DIAGNOSIS — J301 Allergic rhinitis due to pollen: Secondary | ICD-10-CM

## 2016-11-05 MED ORDER — HYDROCHLOROTHIAZIDE 12.5 MG PO TABS: 12.5000 mg | ORAL_TABLET | Freq: Every day | ORAL | 3 refills | Status: DC

## 2016-11-05 MED ORDER — FLUTICASONE-SALMETEROL 250-50 MCG/DOSE IN AEPB: INHALATION_SPRAY | RESPIRATORY_TRACT | 5 refills | Status: DC

## 2016-11-05 MED ORDER — METOPROLOL SUCCINATE ER 50 MG PO TB24: 50.0000 mg | ORAL_TABLET | Freq: Two times a day (BID) | ORAL | 3 refills | Status: DC

## 2016-11-05 MED ORDER — CETIRIZINE HCL 10 MG PO TABS: 10.0000 mg | ORAL_TABLET | Freq: Every day | ORAL | 1 refills | Status: DC

## 2016-11-05 MED ORDER — FLUTICASONE PROPIONATE 50 MCG/ACT NA SUSP: 2.0000 | Freq: Every day | NASAL | 11 refills | Status: DC

## 2016-11-05 MED ORDER — DULOXETINE HCL 30 MG PO CPEP: ORAL_CAPSULE | ORAL | 3 refills | Status: DC

## 2016-11-05 NOTE — Progress Notes (Signed)
Patient: Elizabeth [name], Female    DOB: Apr 14, 1946, 71 y.o.   MRN: [id number] Visit Date: 11/05/2016  Today's Provider: Mar Daring, PA-C   Chief Complaint  Patient presents with  . Annual Exam   Subjective:    Annual physical exam Elizabeth [name] is a 71 y.o. female who presents today for health maintenance and complete physical. She feels well. She reports exercising daily. She reports she is sleeping well. 11/01/15 CPE 11/05/15 Mammogram-BI-RADS 12/05/15 BMD-osteopenia 03/03/06 Colonoscopy-normal; no polyps-no family history of colon cancer -----------------------------------------------------------------   Review of Systems  Constitutional: Negative.   HENT: Positive for nosebleeds, postnasal drip, sneezing and tinnitus.   Eyes: Negative.   Respiratory: Positive for wheezing.   Cardiovascular: Negative.   Gastrointestinal: Negative.   Endocrine: Negative.   Genitourinary: Negative.   Musculoskeletal: Positive for arthralgias and myalgias.  Skin: Negative.   Allergic/Immunologic: Positive for environmental allergies.  Neurological: Negative.   Hematological: Bruises/bleeds easily.  Psychiatric/Behavioral: Negative.     Social History      She  reports that she quit smoking about 13 years ago. She has a 17.50 pack-year smoking history. She has never used smokeless tobacco. She reports that she drinks about 3.6 oz of alcohol per week . She reports that she does not use drugs.       Social History   Social History  . Marital status: Married    Spouse name: Eulas Post  . Number of children: 2  . Years of education: N/A   Occupational History  . retired    Social History Main Topics  . Smoking status: Former Smoker    Packs/day: 0.50    Years: 35.00    Quit date: 05/20/2003  . Smokeless tobacco: Never Used  . Alcohol use 3.6 oz/week    6 Glasses of wine per week     Comment: occasional  . Drug use: No  . Sexual activity: Not Asked   Other Topics  Concern  . None   Social History Narrative  . None    Past Medical History:  Diagnosis Date  . Allergy   . Cancer ([place]) 09/2014   Skin Cancer  . Hyperlipidemia   . Hypertension      Patient Active Problem List   Diagnosis Date Noted  . 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 Allergic rhinitis 09/20/2014  . 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 Colitis 09/20/2014  . Dermatitis, eczematoid 09/20/2014  . Elevated hemoglobin ([place]) 09/20/2014  . Calcium blood increased 09/20/2014  . Blood glucose elevated 09/20/2014  . Hypertension 09/20/2014  . Hypertriglyceridemia 09/20/2014    Past Surgical History:  Procedure Laterality Date  . ABDOMINAL HYSTERECTOMY  1989   due to menorrhagia  . DILATION AND CURETTAGE OF UTERUS  1977  . SKIN CANCER EXCISION  10/2014   on chest; Dr. Evorn Gong  . TRIGGER FINGER RELEASE Right 2000  . WRIST ARTHROPLASTY Left 1991    Family History        Family Status  Relation Status  . Mother Deceased  . Father Deceased  . Brother Alive  . Brother Alive  . Brother Alive  . Ethlyn Daniels (Not Specified)        Her family history includes Arthritis in her mother; Breast cancer (age of onset: 14) in her paternal aunt; COPD in her father; Cancer in her brother, brother, and father; Cerebrovascular Disease in her mother; Heart disease in her mother; Hypertension in her father.  Allergies  Allergen Reactions  . Furadantin  [Nitrofurantoin]   . Phenobarbital      Current Outpatient Prescriptions:  .  Calcium-Vitamin D 600-200 MG-UNIT per tablet, Take by mouth., Disp: , Rfl:  .  cetirizine (ZYRTEC) 10 MG tablet, Take 1 tablet (10 mg total) by mouth daily., Disp: 30 tablet, Rfl: 11 .  clobetasol cream (TEMOVATE) 0.05 %, , Disp: , Rfl:  .  DULoxetine (CYMBALTA) 30 MG capsule, TAKE ONE CAPSULE BY MOUTH DAILY AS NEEDED, Disp: 90 capsule, Rfl: 3 .  fluticasone (FLONASE) 50 MCG/ACT nasal spray, Place 2 sprays into both nostrils daily., Disp: 16 g, Rfl: 11 .   Fluticasone-Salmeterol (ADVAIR DISKUS) 250-50 MCG/DOSE AEPB, INHALE ONE PUFF INTO LUNGS EVERY 12 HOURS TWICE A DAY. RINSE MOUTH AFTER USE., Disp: 60 each, Rfl: 5 .  folic acid (FOLVITE) 1 MG tablet, Take 1 mg by mouth daily. , Disp: , Rfl:  .  hydrochlorothiazide (HYDRODIURIL) 12.5 MG tablet, Take 1 tablet (12.5 mg total) by mouth daily., Disp: 90 tablet, Rfl: 3 .  losartan (COZAAR) 100 MG tablet, Take 1 tablet (100 mg total) by mouth daily., Disp: 90 tablet, Rfl: 3 .  methotrexate 2.5 MG tablet, Take 5 tablets by mouth once a week., Disp: , Rfl:  .  metoprolol succinate (TOPROL-XL) 50 MG 24 hr tablet, Take 1 tablet (50 mg total) by mouth 2 (two) times daily. Take with or immediately following a meal., Disp: 180 tablet, Rfl: 3 .  montelukast (SINGULAIR) 10 MG tablet, Take 1 tablet (10 mg total) by mouth daily., Disp: 90 tablet, Rfl: 3 .  Multiple Vitamin tablet, Take by mouth., Disp: , Rfl:  .  Multiple Vitamins-Minerals (PRESERVISION AREDS PO), Take by mouth., Disp: , Rfl:    Patient Care Team: Mar Daring, PA-C as PCP - General (Family Medicine)      Objective:   Vitals: BP 126/80 (BP Location: Left Arm, Patient Position: Sitting, Cuff Size: Large)   Pulse 76   Temp 98.7 F (37.1 C) (Oral)   Resp 16   Ht 5' 6.5" (1.689 m)   Wt 195 lb 9.6 oz (88.7 kg)   SpO2 99%   BMI 31.10 kg/m    Vitals:   11/05/16 1404  BP: 126/80  Pulse: 76  Resp: 16  Temp: 98.7 F (37.1 C)  TempSrc: Oral  SpO2: 99%  Weight: 195 lb 9.6 oz (88.7 kg)  Height: 5' 6.5" (1.689 m)     Physical Exam  Constitutional: She is oriented to person, place, and time. She appears well-developed and well-nourished.  HENT:  Head: Normocephalic and atraumatic.  Right Ear: Hearing, external ear and ear canal normal. A middle ear effusion is present.  Left Ear: Hearing, external ear and ear canal normal. A middle ear effusion is present.  Nose: Nose normal.  Mouth/Throat: Uvula is midline, oropharynx is clear  and moist and mucous membranes are normal.  Eyes: Conjunctivae and EOM are normal. Pupils are equal, round, and reactive to light.  Neck: Normal range of motion. Neck supple. Carotid bruit is not present.  Cardiovascular: Normal rate and regular rhythm.   Murmur heard.  Systolic murmur is present with a grade of 2/6  Pulmonary/Chest: Effort normal and breath sounds normal. She has no decreased breath sounds. She has no wheezes. She has no rhonchi. She has no rales.  Abdominal: Soft. Bowel sounds are normal.  Musculoskeletal: Normal range of motion.  Neurological: She is alert and oriented to person, place, and time.  Skin: Skin is warm and dry.  Psychiatric: She has a normal mood and affect. Her behavior is normal. Judgment and thought content normal.  Vitals reviewed.    Depression Screen PHQ 2/9 Scores 11/05/2016 11/01/2015 10/30/2014  PHQ - 2 Score 0 0 0  PHQ- 9 Score 0 - -   Cognitive Testing - 6-CIT  Correct? Score   What year is it? yes 0 0 or 4  What month is it? yes 0 0 or 3  Memorize:    Pia Mau,  41,  [place],      What time is it? (within 1 hour) yes 0 0 or 3  Count backwards from 20 yes 0 0, 2, or 4  Name the months of the year yes 0 0, 2, or 4  Repeat name & address above yes 0 0, 2, 4, 6, 8, or 10       TOTAL SCORE  0/28   Interpretation:  Normal  Normal (0-7) Abnormal (8-28)    Fall Risk  11/05/2016 11/01/2015 10/30/2014  Falls in the past year? Yes Yes No  Number falls in past yr: 1 1 -  Injury with Fall? Yes Yes -    Audit-C Alcohol Use Screening  Question Answer Points  How often do you have alcoholic drink? 1 times daily 1  On days you do drink alcohol, how many drinks do you typically consume? 1 1  How oftey will you drink 6 or more in a total? never 0  Total Score:  2   A score of 3 or more in women, and 4 or more in men indicates increased risk for alcohol abuse, EXCEPT if all of the points are from question 1.  Functional Status  Survey: Is the patient deaf or have difficulty hearing?: Yes Does the patient have difficulty concentrating, remembering, or making decisions?: No Does the patient have difficulty walking or climbing stairs?: No Does the patient have difficulty dressing or bathing?: No Does the patient have difficulty doing errands alone such as visiting a doctor's office or shopping?: No       Assessment & Plan:     Routine Health Maintenance and Physical Exam  Exercise Activities and Dietary recommendations Goals    . Exercise 150 minutes per week (moderate activity)       Immunization History  Administered Date(s) Administered  . Influenza-Unspecified 01/18/2015  . Pneumococcal Conjugate-13 07/04/2013  . Pneumococcal Polysaccharide-23 09/30/2011  . Td 09/21/2008  . Zoster Recombinat (Shingrix) 09/02/2016    Health Maintenance  Topic Date Due  . Hepatitis C Screening  September 11, 1945  . COLONOSCOPY  03/03/2016  . INFLUENZA VACCINE  12/17/2016  . MAMMOGRAM  11/04/2017  . TETANUS/TDAP  09/22/2018  . DEXA SCAN  Completed  . PNA vac Low Risk Adult  Completed     Discussed health benefits of physical activity, and encouraged her to engage in regular exercise appropriate for her age and condition.    1. Medicare annual wellness visit, subsequent Normal physical exam today.  2. Breast cancer screening Breast exam today was normal. There is no family history of breast cancer. She does perform regular self breast exams. Mammogram was ordered as below. Information for [facility] was given to patient so she may schedule her mammogram at her convenience. - MM Digital Screening; Future  3. Colon cancer screening Last colonoscopy was in 2007 and normal, no polyps. No family history of colon cancer. Patient prefers to have cologuard. This  has been ordered as below.  - Cologuard  4. Essential hypertension Stable. Diagnosis pulled for medication refill. Continue current medical  treatment plan. Will check labs as below and f/u pending results. Sees Dr. Clayborn Bigness next week.  - CBC w/Diff/Platelet - Comprehensive Metabolic Panel (CMET) - Lipid Profile - HgB A1c - hydrochlorothiazide (HYDRODIURIL) 12.5 MG tablet; Take 1 tablet (12.5 mg total) by mouth daily.  Dispense: 90 tablet; Refill: 3 - metoprolol succinate (TOPROL-XL) 50 MG 24 hr tablet; Take 1 tablet (50 mg total) by mouth 2 (two) times daily. Take with or immediately following a meal.  Dispense: 180 tablet; Refill: 3  5. Hypertriglyceridemia Diet controlled. Will check labs as below and f/u pending results. - CBC w/Diff/Platelet - Comprehensive Metabolic Panel (CMET) - Lipid Profile - HgB A1c  6. Elevated hemoglobin ([place]) Will check labs as below and f/u pending results. - CBC w/Diff/Platelet  7. Blood glucose elevated Diet controlled. Will check labs as below and f/u pending results. - Comprehensive Metabolic Panel (CMET) - Lipid Profile - HgB A1c  8. Intrinsic eczema Dr. Evorn Gong follows. - Quantiferon tb gold assay  9. Non-seasonal allergic rhinitis due to pollen Stable. Diagnosis pulled for medication refill. Continue current medical treatment plan. - cetirizine (ZYRTEC) 10 MG tablet; Take 1 tablet (10 mg total) by mouth daily.  Dispense: 90 tablet; Refill: 1 - Fluticasone-Salmeterol (ADVAIR DISKUS) 250-50 MCG/DOSE AEPB; INHALE ONE PUFF INTO LUNGS EVERY 12 HOURS TWICE A DAY. RINSE MOUTH AFTER USE.  Dispense: 60 each; Refill: 5 - fluticasone (FLONASE) 50 MCG/ACT nasal spray; Place 2 sprays into both nostrils daily.  Dispense: 16 g; Refill: 11  10. Adjustment disorder with other symptom Stable. Diagnosis pulled for medication refill. Continue current medical treatment plan. - DULoxetine (CYMBALTA) 30 MG capsule; TAKE ONE CAPSULE BY MOUTH DAILY AS NEEDED  Dispense: 90 capsule; Refill: 3  --------------------------------------------------------------------    Mar Daring, PA-C    [facility]

## 2016-11-05 NOTE — Patient Instructions (Signed)
Health Maintenance for Postmenopausal Women Menopause is a normal process in which your reproductive ability comes to an end. This process happens gradually over a span of months to years, usually between the ages of 22 and 9. Menopause is complete when you have missed 12 consecutive menstrual periods. It is important to talk with your health care provider about some of the most common conditions that affect postmenopausal women, such as heart disease, cancer, and bone loss (osteoporosis). Adopting a healthy lifestyle and getting preventive care can help to promote your health and wellness. Those actions can also lower your chances of developing some of these common conditions. What should I know about menopause? During menopause, you may experience a number of symptoms, such as:  Moderate-to-severe hot flashes.  Night sweats.  Decrease in sex drive.  Mood swings.  Headaches.  Tiredness.  Irritability.  Memory problems.  Insomnia.  Choosing to treat or not to treat menopausal changes is an individual decision that you make with your health care provider. What should I know about hormone replacement therapy and supplements? Hormone therapy products are effective for treating symptoms that are associated with menopause, such as hot flashes and night sweats. Hormone replacement carries certain risks, especially as you become older. If you are thinking about using estrogen or estrogen with progestin treatments, discuss the benefits and risks with your health care provider. What should I know about heart disease and stroke? Heart disease, heart attack, and stroke become more likely as you age. This may be due, in part, to the hormonal changes that your body experiences during menopause. These can affect how your body processes dietary fats, triglycerides, and cholesterol. Heart attack and stroke are both medical emergencies. There are many things that you can do to help prevent heart disease  and stroke:  Have your blood pressure checked at least every 1-2 years. High blood pressure causes heart disease and increases the risk of stroke.  If you are 53-22 years old, ask your health care provider if you should take aspirin to prevent a heart attack or a stroke.  Do not use any tobacco products, including cigarettes, chewing tobacco, or electronic cigarettes. If you need help quitting, ask your health care provider.  It is important to eat a healthy diet and maintain a healthy weight. ? Be sure to include plenty of vegetables, fruits, low-fat dairy products, and lean protein. ? Avoid eating foods that are high in solid fats, added sugars, or salt (sodium).  Get regular exercise. This is one of the most important things that you can do for your health. ? Try to exercise for at least 150 minutes each week. The type of exercise that you do should increase your heart rate and make you sweat. This is known as moderate-intensity exercise. ? Try to do strengthening exercises at least twice each week. Do these in addition to the moderate-intensity exercise.  Know your numbers.Ask your health care provider to check your cholesterol and your blood glucose. Continue to have your blood tested as directed by your health care provider.  What should I know about cancer screening? There are several types of cancer. Take the following steps to reduce your risk and to catch any cancer development as early as possible. Breast Cancer  Practice breast self-awareness. ? This means understanding how your breasts normally appear and feel. ? It also means doing regular breast self-exams. Let your health care provider know about any changes, no matter how small.  If you are 40  or older, have a clinician do a breast exam (clinical breast exam or CBE) every year. Depending on your age, family history, and medical history, it may be recommended that you also have a yearly breast X-ray (mammogram).  If you  have a family history of breast cancer, talk with your health care provider about genetic screening.  If you are at high risk for breast cancer, talk with your health care provider about having an MRI and a mammogram every year.  Breast cancer (BRCA) gene test is recommended for women who have family members with BRCA-related cancers. Results of the assessment will determine the need for genetic counseling and BRCA1 and for BRCA2 testing. BRCA-related cancers include these types: ? Breast. This occurs in males or females. ? Ovarian. ? Tubal. This may also be called fallopian tube cancer. ? Cancer of the abdominal or pelvic lining (peritoneal cancer). ? Prostate. ? Pancreatic.  Cervical, Uterine, and Ovarian Cancer Your health care provider may recommend that you be screened regularly for cancer of the pelvic organs. These include your ovaries, uterus, and vagina. This screening involves a pelvic exam, which includes checking for microscopic changes to the surface of your cervix (Pap test).  For women ages 21-65, health care providers may recommend a pelvic exam and a Pap test every three years. For women ages 79-65, they may recommend the Pap test and pelvic exam, combined with testing for human papilloma virus (HPV), every five years. Some types of HPV increase your risk of cervical cancer. Testing for HPV may also be done on women of any age who have unclear Pap test results.  Other health care providers may not recommend any screening for nonpregnant women who are considered low risk for pelvic cancer and have no symptoms. Ask your health care provider if a screening pelvic exam is right for you.  If you have had past treatment for cervical cancer or a condition that could lead to cancer, you need Pap tests and screening for cancer for at least 20 years after your treatment. If Pap tests have been discontinued for you, your risk factors (such as having a new sexual partner) need to be  reassessed to determine if you should start having screenings again. Some women have medical problems that increase the chance of getting cervical cancer. In these cases, your health care provider may recommend that you have screening and Pap tests more often.  If you have a family history of uterine cancer or ovarian cancer, talk with your health care provider about genetic screening.  If you have vaginal bleeding after reaching menopause, tell your health care provider.  There are currently no reliable tests available to screen for ovarian cancer.  Lung Cancer Lung cancer screening is recommended for adults 69-62 years old who are at high risk for lung cancer because of a history of smoking. A yearly low-dose CT scan of the lungs is recommended if you:  Currently smoke.  Have a history of at least 30 pack-years of smoking and you currently smoke or have quit within the past 15 years. A pack-year is smoking an average of one pack of cigarettes per day for one year.  Yearly screening should:  Continue until it has been 15 years since you quit.  Stop if you develop a health problem that would prevent you from having lung cancer treatment.  Colorectal Cancer  This type of cancer can be detected and can often be prevented.  Routine colorectal cancer screening usually begins at  age 42 and continues through age 45.  If you have risk factors for colon cancer, your health care provider may recommend that you be screened at an earlier age.  If you have a family history of colorectal cancer, talk with your health care provider about genetic screening.  Your health care provider may also recommend using home test kits to check for hidden blood in your stool.  A small camera at the end of a tube can be used to examine your colon directly (sigmoidoscopy or colonoscopy). This is done to check for the earliest forms of colorectal cancer.  Direct examination of the colon should be repeated every  5-10 years until age 71. However, if early forms of precancerous polyps or small growths are found or if you have a family history or genetic risk for colorectal cancer, you may need to be screened more often.  Skin Cancer  Check your skin from head to toe regularly.  Monitor any moles. Be sure to tell your health care provider: ? About any new moles or changes in moles, especially if there is a change in a mole's shape or color. ? If you have a mole that is larger than the size of a pencil eraser.  If any of your family members has a history of skin cancer, especially at a young age, talk with your health care provider about genetic screening.  Always use sunscreen. Apply sunscreen liberally and repeatedly throughout the day.  Whenever you are outside, protect yourself by wearing long sleeves, pants, a wide-brimmed hat, and sunglasses.  What should I know about osteoporosis? Osteoporosis is a condition in which bone destruction happens more quickly than new bone creation. After menopause, you may be at an increased risk for osteoporosis. To help prevent osteoporosis or the bone fractures that can happen because of osteoporosis, the following is recommended:  If you are 46-71 years old, get at least 1,000 mg of calcium and at least 600 mg of vitamin D per day.  If you are older than age 55 but younger than age 65, get at least 1,200 mg of calcium and at least 600 mg of vitamin D per day.  If you are older than age 54, get at least 1,200 mg of calcium and at least 800 mg of vitamin D per day.  Smoking and excessive alcohol intake increase the risk of osteoporosis. Eat foods that are rich in calcium and vitamin D, and do weight-bearing exercises several times each week as directed by your health care provider. What should I know about how menopause affects my mental health? Depression may occur at any age, but it is more common as you become older. Common symptoms of depression  include:  Low or sad mood.  Changes in sleep patterns.  Changes in appetite or eating patterns.  Feeling an overall lack of motivation or enjoyment of activities that you previously enjoyed.  Frequent crying spells.  Talk with your health care provider if you think that you are experiencing depression. What should I know about immunizations? It is important that you get and maintain your immunizations. These include:  Tetanus, diphtheria, and pertussis (Tdap) booster vaccine.  Influenza every year before the flu season begins.  Pneumonia vaccine.  Shingles vaccine.  Your health care provider may also recommend other immunizations. This information is not intended to replace advice given to you by your health care provider. Make sure you discuss any questions you have with your health care provider. Document Released: 06/27/2005  Document Revised: 11/23/2015 Document Reviewed: 02/06/2015 Elsevier Interactive Patient Education  2018 [employer]  

## 2016-11-07 ENCOUNTER — Telehealth: Payer: Self-pay | Admitting: Physician Assistant

## 2016-11-07 NOTE — Telephone Encounter (Signed)
Order for cologuard faxed to [facility] °

## 2016-11-08 LAB — COMPREHENSIVE METABOLIC PANEL
A/G RATIO: 1.6 (ref 1.2–2.2)
ALK PHOS: 69 IU/L (ref 39–117)
ALT: 26 IU/L (ref 0–32)
AST: 21 IU/L (ref 0–40)
Albumin: 4.3 g/dL (ref 3.5–4.8)
BUN/Creatinine Ratio: 34 — ABNORMAL HIGH (ref 12–28)
BUN: 17 mg/dL (ref 8–27)
Bilirubin Total: 0.3 mg/dL (ref 0.0–1.2)
CALCIUM: 11.3 mg/dL — AB (ref 8.7–10.3)
CO2: 24 mmol/L (ref 20–29)
Chloride: 100 mmol/L (ref 96–106)
Creatinine, Ser: 0.5 mg/dL — ABNORMAL LOW (ref 0.57–1.00)
GFR calc Af Amer: 112 mL/min/{1.73_m2} (ref >59)
GFR, EST NON AFRICAN AMERICAN: 97 mL/min/{1.73_m2} (ref >59)
GLOBULIN, TOTAL: 2.7 g/dL (ref 1.5–4.5)
Glucose: 77 mg/dL (ref 65–99)
POTASSIUM: 4.3 mmol/L (ref 3.5–5.2)
SODIUM: 139 mmol/L (ref 134–144)
Total Protein: 7 g/dL (ref 6.0–8.5)

## 2016-11-08 LAB — CBC WITH DIFFERENTIAL/PLATELET
Basophils Absolute: 0.1 10*3/uL (ref 0.0–0.2)
Basos: 1 %
EOS (ABSOLUTE): 0.2 10*3/uL (ref 0.0–0.4)
Eos: 3 %
Hematocrit: 41.6 % (ref 34.0–46.6)
Hemoglobin: 14.2 g/dL (ref 11.1–15.9)
IMMATURE GRANS (ABS): 0 10*3/uL (ref 0.0–0.1)
Immature Granulocytes: 0 %
LYMPHS: 34 %
Lymphocytes Absolute: 2.5 10*3/uL (ref 0.7–3.1)
MCH: 33.4 pg — ABNORMAL HIGH (ref 26.6–33.0)
MCHC: 34.1 g/dL (ref 31.5–35.7)
MCV: 98 fL — AB (ref 79–97)
Monocytes Absolute: 0.4 10*3/uL (ref 0.1–0.9)
Monocytes: 6 %
NEUTROS PCT: 56 %
Neutrophils Absolute: 4 10*3/uL (ref 1.4–7.0)
Platelets: 252 10*3/uL (ref 150–379)
RBC: 4.25 x10E6/uL (ref 3.77–5.28)
RDW: 14 % (ref 12.3–15.4)
WBC: 7.2 10*3/uL (ref 3.4–10.8)

## 2016-11-08 LAB — LIPID PANEL
CHOL/HDL RATIO: 2.4 ratio (ref 0.0–4.4)
CHOLESTEROL TOTAL: 165 mg/dL (ref 100–199)
HDL: 68 mg/dL (ref >39)
LDL Calculated: 74 mg/dL (ref 0–99)
TRIGLYCERIDES: 115 mg/dL (ref 0–149)
VLDL Cholesterol Cal: 23 mg/dL (ref 5–40)

## 2016-11-08 LAB — HEMOGLOBIN A1C
Est. average glucose Bld gHb Est-mCnc: 100 mg/dL
Hgb A1c MFr Bld: 5.1 % (ref 4.8–5.6)

## 2016-11-12 ENCOUNTER — Telehealth: Payer: Self-pay

## 2016-11-12 LAB — QUANTIFERON IN TUBE
QFT TB AG MINUS NIL VALUE: 0.01 [IU]/mL
QUANTIFERON TB AG VALUE: 0.08 IU/mL
QUANTIFERON TB GOLD: NEGATIVE
Quantiferon Nil Value: 0.07 IU/mL

## 2016-11-12 LAB — QUANTIFERON TB GOLD ASSAY (BLOOD)

## 2016-11-12 NOTE — Telephone Encounter (Signed)
-----   Message from Mar Daring, PA-C sent at 11/12/2016 10:58 AM EDT ----- Labs are fairly stable. Only change is calcium is borderline high so would recommend to cut back on any calcium supplements if taking any. Tb testing was still negative. We can print and send to Dr. Evorn Gong if she wishes for [place] to do that. Thanks.

## 2016-11-12 NOTE — Telephone Encounter (Signed)
Patient advised. Lab results faxed to Dr. Evorn Gong.

## 2016-11-24 LAB — COLOGUARD

## 2016-11-26 ENCOUNTER — Ambulatory Visit
Admission: RE | Admit: 2016-11-26 | Discharge: 2016-11-26 | Disposition: A | Discharge status: Home / Self Care | Payer: Medicare Other | Source: Ambulatory Visit | Attending: Physician Assistant | Admitting: Physician Assistant

## 2016-11-26 DIAGNOSIS — Z1231 Encounter for screening mammogram for malignant neoplasm of breast: Secondary | ICD-10-CM | POA: Insufficient documentation

## 2016-11-26 DIAGNOSIS — Z1239 Encounter for other screening for malignant neoplasm of breast: Secondary | ICD-10-CM

## 2016-11-27 ENCOUNTER — Telehealth: Payer: Self-pay | Admitting: Physician Assistant

## 2016-11-27 LAB — COLOGUARD: COLOGUARD: NEGATIVE

## 2016-11-27 NOTE — Telephone Encounter (Signed)
Patient advised as below.  

## 2016-11-27 NOTE — Telephone Encounter (Signed)
-----   Message from Mar Daring, [place] sent at 11/27/2016 10:04 AM EDT ----- Normal mammogram. Repeat screening in one year.

## 2016-11-27 NOTE — Telephone Encounter (Signed)
Pt called saying she is going out of town and wants to know if you will give her test results to her husband .  She recently had a mammogram and colon guard test.  He is on her DPR..  Thanks teri

## 2016-12-03 ENCOUNTER — Encounter: Payer: Self-pay | Admitting: Physician Assistant

## 2017-01-29 ENCOUNTER — Ambulatory Visit (INDEPENDENT_AMBULATORY_CARE_PROVIDER_SITE_OTHER): Payer: Medicare Other | Admitting: Physician Assistant

## 2017-01-29 ENCOUNTER — Encounter: Payer: Self-pay | Admitting: Physician Assistant

## 2017-01-29 VITALS — BP 130/70 | HR 68 | Temp 98.2°F | Resp 16 | Wt 197.4 lb

## 2017-01-29 DIAGNOSIS — J40 Bronchitis, not specified as acute or chronic: Secondary | ICD-10-CM | POA: Diagnosis not present

## 2017-01-29 MED ORDER — PREDNISONE 10 MG PO TABS: ORAL_TABLET | ORAL | 0 refills | Status: DC

## 2017-01-29 MED ORDER — LEVOFLOXACIN 500 MG PO TABS: 500.0000 mg | ORAL_TABLET | Freq: Every day | ORAL | 0 refills | Status: DC

## 2017-01-29 NOTE — Patient Instructions (Signed)

## 2017-01-29 NOTE — Progress Notes (Signed)
Patient: Elizabeth [name] Female    DOB: [birth year]   71 y.o.   MRN: [id number] Visit Date: 01/29/2017  Today's Provider: Mar Daring, PA-C   Chief Complaint  Patient presents with  . URI   Subjective:    URI   This is a new problem. The current episode started 1 to 4 weeks ago (3 weeks ago). There has been no fever. Associated symptoms include congestion, coughing, a plugged ear sensation, rhinorrhea, sinus pain, sneezing, a sore throat and wheezing. Pertinent negatives include no abdominal pain or chest pain. She has tried decongestant (she started the Levaquin finished 2 days ago and Prednisone took the last Prednisone yesterday. Cold and Flu BP) for the symptoms. The treatment provided mild relief.      Allergies  Allergen Reactions  . Furadantin  [Nitrofurantoin]   . Phenobarbital      Current Outpatient Prescriptions:  .  Calcium-Vitamin D 600-200 MG-UNIT per tablet, Take by mouth., Disp: , Rfl:  .  cetirizine (ZYRTEC) 10 MG tablet, Take 1 tablet (10 mg total) by mouth daily., Disp: 90 tablet, Rfl: 1 .  DULoxetine (CYMBALTA) 30 MG capsule, TAKE ONE CAPSULE BY MOUTH DAILY AS NEEDED, Disp: 90 capsule, Rfl: 3 .  fluticasone (FLONASE) 50 MCG/ACT nasal spray, Place 2 sprays into both nostrils daily., Disp: 16 g, Rfl: 11 .  Fluticasone-Salmeterol (ADVAIR DISKUS) 250-50 MCG/DOSE AEPB, INHALE ONE PUFF INTO LUNGS EVERY 12 HOURS TWICE A DAY. RINSE MOUTH AFTER USE., Disp: 60 each, Rfl: 5 .  folic acid (FOLVITE) 1 MG tablet, Take 1 mg by mouth daily. , Disp: , Rfl:  .  hydrochlorothiazide (HYDRODIURIL) 12.5 MG tablet, Take 1 tablet (12.5 mg total) by mouth daily., Disp: 90 tablet, Rfl: 3 .  losartan (COZAAR) 100 MG tablet, Take 1 tablet (100 mg total) by mouth daily., Disp: 90 tablet, Rfl: 3 .  methotrexate 2.5 MG tablet, Take 5 tablets by mouth once a week., Disp: , Rfl:  .  metoprolol succinate (TOPROL-XL) 50 MG 24 hr tablet, Take 1 tablet (50 mg total) by mouth 2 (two)  times daily. Take with or immediately following a meal., Disp: 180 tablet, Rfl: 3 .  montelukast (SINGULAIR) 10 MG tablet, Take 1 tablet (10 mg total) by mouth daily., Disp: 90 tablet, Rfl: 3 .  Multiple Vitamin tablet, Take by mouth., Disp: , Rfl:  .  Multiple Vitamins-Minerals (PRESERVISION AREDS PO), Take by mouth., Disp: , Rfl:  .  clobetasol cream (TEMOVATE) 0.05 %, , Disp: , Rfl:   Review of Systems  Constitutional: Positive for diaphoresis. Negative for fever.  HENT: Positive for congestion, postnasal drip, rhinorrhea, sinus pain, sneezing and sore throat.   Respiratory: Positive for cough and wheezing. Negative for chest tightness and shortness of breath.   Cardiovascular: Negative for chest pain.  Gastrointestinal: Negative for abdominal pain.    Social History  Substance Use Topics  . Smoking status: Former Smoker    Packs/day: 0.50    Years: 35.00    Quit date: 05/20/2003  . Smokeless tobacco: Never Used  . Alcohol use 3.6 oz/week    6 Glasses of wine per week     Comment: occasional   Objective:   BP 130/70 (BP Location: Left Arm, Patient Position: Sitting, Cuff Size: Large)   Pulse 68   Temp 98.2 F (36.8 C) (Oral)   Resp 16   Wt 197 lb 6.4 oz (89.5 kg)   SpO2 98%   BMI 31.38  kg/m     Physical Exam  Constitutional: She appears well-developed and well-nourished. No distress.  HENT:  Head: Normocephalic and atraumatic.  Right Ear: Hearing, tympanic membrane, external ear and ear canal normal.  Left Ear: Hearing, tympanic membrane, external ear and ear canal normal.  Nose: Nose normal.  Mouth/Throat: Uvula is midline, oropharynx is clear and moist and mucous membranes are normal. No oropharyngeal exudate.  Eyes: Pupils are equal, round, and reactive to light. Conjunctivae are normal. Right eye exhibits no discharge. Left eye exhibits no discharge. No scleral icterus.  Neck: Normal range of motion. Neck supple. No tracheal deviation present. No thyromegaly  present.  Cardiovascular: Normal rate, regular rhythm and normal heart sounds.  Exam reveals no gallop and no friction rub.   No murmur heard. Pulmonary/Chest: Effort normal. No stridor. No respiratory distress. She has wheezes (inspiratory) in the left upper field, the left middle field and the left lower field. She has no rales.  Lymphadenopathy:    She has no cervical adenopathy.  Skin: Skin is warm and dry. She is not diaphoretic.  Vitals reviewed.      Assessment & Plan:     1. Bronchitis Symptoms worsening. Wheezing noted in the left lung. Prednisone refilled as below. Levaquin given for her to pick up but not start unless symptoms worsen this weekend with the inclement weather coming in. She is in agreement. She is to call if symptoms worsen.  - predniSONE (DELTASONE) 10 MG tablet; Take 6 tabs PO on day 1&2, 5 tabs PO on day 3&4, 4 tabs PO on day 5&6, 3 tabs PO on day 7&8, 2 tabs PO on day 9&10, 1 tab PO on day 11&12.  Dispense: 42 tablet; Refill: 0 - levofloxacin (LEVAQUIN) 500 MG tablet; Take 1 tablet (500 mg total) by mouth daily.  Dispense: 7 tablet; Refill: 0       Mar Daring, PA-C  [facility]

## 2017-06-09 ENCOUNTER — Other Ambulatory Visit: Payer: Self-pay | Admitting: Physician Assistant

## 2017-06-09 DIAGNOSIS — J301 Allergic rhinitis due to pollen: Secondary | ICD-10-CM

## 2017-08-04 ENCOUNTER — Other Ambulatory Visit: Payer: Self-pay | Admitting: Physician Assistant

## 2017-08-04 DIAGNOSIS — I10 Essential (primary) hypertension: Secondary | ICD-10-CM

## 2017-09-09 ENCOUNTER — Other Ambulatory Visit: Payer: Self-pay | Admitting: Physician Assistant

## 2017-09-09 DIAGNOSIS — F4329 Adjustment disorder with other symptoms: Secondary | ICD-10-CM

## 2017-09-09 DIAGNOSIS — I10 Essential (primary) hypertension: Secondary | ICD-10-CM

## 2017-11-03 ENCOUNTER — Other Ambulatory Visit: Payer: Self-pay | Admitting: Physician Assistant

## 2017-11-03 DIAGNOSIS — I10 Essential (primary) hypertension: Secondary | ICD-10-CM

## 2017-11-04 ENCOUNTER — Other Ambulatory Visit: Payer: Self-pay | Admitting: Gastroenterology

## 2017-11-04 DIAGNOSIS — Z79899 Other long term (current) drug therapy: Secondary | ICD-10-CM

## 2017-11-09 ENCOUNTER — Ambulatory Visit (INDEPENDENT_AMBULATORY_CARE_PROVIDER_SITE_OTHER): Payer: Medicare Other

## 2017-11-09 ENCOUNTER — Other Ambulatory Visit: Payer: Self-pay | Admitting: Physician Assistant

## 2017-11-09 VITALS — BP 132/66 | HR 64 | Temp 98.6°F | Ht 67.0 in | Wt 203.6 lb

## 2017-11-09 DIAGNOSIS — Z79899 Other long term (current) drug therapy: Secondary | ICD-10-CM

## 2017-11-09 DIAGNOSIS — L2084 Intrinsic (allergic) eczema: Secondary | ICD-10-CM

## 2017-11-09 DIAGNOSIS — D582 Other hemoglobinopathies: Secondary | ICD-10-CM

## 2017-11-09 DIAGNOSIS — I10 Essential (primary) hypertension: Secondary | ICD-10-CM

## 2017-11-09 DIAGNOSIS — Z0000 Encounter for general adult medical examination without abnormal findings: Secondary | ICD-10-CM | POA: Diagnosis not present

## 2017-11-09 DIAGNOSIS — E781 Pure hyperglyceridemia: Secondary | ICD-10-CM

## 2017-11-09 DIAGNOSIS — R739 Hyperglycemia, unspecified: Secondary | ICD-10-CM

## 2017-11-09 NOTE — Progress Notes (Signed)
Subjective:   Elizabeth [name] is a 72 y.o. female who presents for Medicare Annual (Subsequent) preventive examination.  Review of Systems:  N/A  Cardiac Risk Factors include: advanced age (>72men, >22 women);hypertension;obesity (BMI >30kg/m2)     Objective:     Vitals: BP 132/66 (BP Location: Right Arm)   Pulse 64   Temp 98.6 F (37 C) (Oral)   Ht 5\' 7"  (1.702 m)   Wt 203 lb 9.6 oz (92.4 kg)   BMI 31.89 kg/m   Body mass index is 31.89 kg/m.  Advanced Directives 11/09/2017 01/29/2017 02/25/2016 11/01/2015 10/30/2014  Does Patient Have a Medical Advance Directive? Yes Yes Yes Yes Yes  Type of Paramedic of [place];Living will - Healthcare Power of [place] in Chart? No - copy requested - - - -    Tobacco Social History   Tobacco Use  Smoking Status Former Smoker  . Packs/day: 0.50  . Years: 35.00  . Pack years: 17.50  . Last attempt to quit: 05/20/2003  . Years since quitting: 14.4  Smokeless Tobacco Never Used     Counseling given: Not Answered   Clinical Intake:  Pre-visit preparation completed: Yes  Pain : No/denies pain Pain Score: 0-No pain     Nutritional Status: BMI > 30  Obese Nutritional Risks: None Diabetes: No  How often do you need to have someone help you when you read instructions, pamphlets, or other written materials from your doctor or pharmacy?: 1 - Never  Interpreter Needed?: No  Information entered by :: [facility], LPN  Past Medical History:  Diagnosis Date  . Allergy   . Cancer ([place]) 09/2014   Skin Cancer  . Hyperlipidemia   . Hypertension    Past Surgical History:  Procedure Laterality Date  . ABDOMINAL HYSTERECTOMY  1989   due to menorrhagia  . DILATION AND CURETTAGE OF UTERUS  1977  . SKIN CANCER EXCISION  10/2014   on chest; Dr. Evorn Gong  . TRIGGER FINGER RELEASE Right 2000  . WRIST ARTHROPLASTY Left 1991     Family History  Problem Relation Age of Onset  . Arthritis Mother   . Cerebrovascular Disease Mother   . Heart disease Mother   . Cancer Father        throat, melanoma, prostate  . Hypertension Father   . COPD Father   . Cancer Brother        skin  . Cancer Brother        skin   . Breast cancer Paternal Aunt 15   Social History   Socioeconomic History  . Marital status: Married    Spouse name: Eulas Post  . Number of children: 2  . Years of education: Not on file  . Highest education level: Bachelor's degree (e.g., BA, AB, BS)  Occupational History  . Occupation: retired  Scientific laboratory technician  . Financial resource strain: Not hard at all  . Food insecurity:    Worry: Never true    Inability: Never true  . Transportation needs:    Medical: No    Non-medical: No  Tobacco Use  . Smoking status: Former Smoker    Packs/day: 0.50    Years: 35.00    Pack years: 17.50    Last attempt to quit: 05/20/2003    Years since quitting: 14.4  . Smokeless tobacco: Never Used  Substance and Sexual Activity  . Alcohol use: Yes  Alcohol/week: 4.2 oz    Types: 7 Glasses of wine per week    Comment: occasional  . Drug use: No  . Sexual activity: Not on file  Lifestyle  . Physical activity:    Days per week: Not on file    Minutes per session: Not on file  . Stress: Not at all  Relationships  . Social connections:    Talks on phone: Not on file    Gets together: Not on file    Attends religious service: Not on file    Active member of club or organization: Not on file    Attends meetings of clubs or organizations: Not on file    Relationship status: Not on file  Other Topics Concern  . Not on file  Social History Narrative  . Not on file    Outpatient Encounter Medications as of 11/09/2017  Medication Sig  . cetirizine (ZYRTEC) 10 MG tablet TAKE 1 TABLET BY MOUTH ONCE A DAY  . cholecalciferol (VITAMIN D) 1000 units tablet Take 1,000 Units by mouth daily.  . clobetasol cream  (TEMOVATE) 9.48 % Apply 1 application topically as needed.   . DULoxetine (CYMBALTA) 30 MG capsule TAKE 1 CAPSULE BY MOUTH DAILY AS NEEDED (Patient taking differently: Take 30 mg by mouth daily. )  . fluticasone (FLONASE) 50 MCG/ACT nasal spray Place 2 sprays into both nostrils daily. (Patient taking differently: Place 2 sprays into both nostrils daily. )  . Fluticasone-Salmeterol (ADVAIR DISKUS) 250-50 MCG/DOSE AEPB INHALE ONE PUFF INTO LUNGS EVERY 12 HOURS TWICE A DAY. RINSE MOUTH AFTER USE. (Patient taking differently: Inhale 1 puff into the lungs. INHALE ONE PUFF INTO LUNGS EVERY 12 HOURS TWICE A DAY. RINSE MOUTH AFTER USE.)  . folic acid (FOLVITE) 1 MG tablet Take 1 mg by mouth daily.   . hydrochlorothiazide (MICROZIDE) 12.5 MG capsule TAKE 1 CAPSULE BY MOUTH ONCE DAILY  . losartan (COZAAR) 100 MG tablet TAKE 1 TABLET BY MOUTH ONCE DAILY (Patient taking differently: TAKE 1 TABLET BY MOUTH ONE DAILY)  . methotrexate 2.5 MG tablet Take 5 tablets by mouth once a week.  . metoprolol succinate (TOPROL-XL) 50 MG 24 hr tablet TAKE 1 TABLET BY MOUTH TWICE A DAY WITH OR IMMEDIATELY FOLLOWING A MEAL.  . montelukast (SINGULAIR) 10 MG tablet TAKE 1 TABLET BY MOUTH ONCE DAILY  . Multiple Vitamin tablet Take 1 tablet by mouth daily.   . Multiple Vitamins-Minerals (PRESERVISION AREDS PO) Take by mouth daily.   . Probiotic Product (PROBIOTIC-10 PO) Take by mouth daily.  . vitamin E 1000 UNIT capsule Take 1,000 Units by mouth daily.  . Calcium-Vitamin D 600-200 MG-UNIT per tablet Take by mouth.  . levofloxacin (LEVAQUIN) 500 MG tablet Take 1 tablet (500 mg total) by mouth daily. (Patient not taking: Reported on 11/09/2017)  . Misc Natural Products (HERBAL ENERGY COMPLEX PO) Take by mouth.  . predniSONE (DELTASONE) 10 MG tablet Take 6 tabs PO on day 1&2, 5 tabs PO on day 3&4, 4 tabs PO on day 5&6, 3 tabs PO on day 7&8, 2 tabs PO on day 9&10, 1 tab PO on day 11&12. (Patient not taking: Reported on 11/09/2017)    No facility-administered encounter medications on file as of 11/09/2017.     Activities of Daily Living In your present state of health, do you have any difficulty performing the following activities: 11/09/2017  Hearing? N  Vision? N  Difficulty concentrating or making decisions? N  Walking or climbing stairs? Y  Comment  Due to occasional knee pain.  Dressing or bathing? N  Doing errands, shopping? N  Preparing Food and eating ? N  Using the Toilet? N  In the past six months, have you accidently leaked urine? Y  Comment Occasionally with pressure, wears protection.  Do you have problems with loss of bowel control? N  Managing your Medications? N  Managing your Finances? N  Housekeeping or managing your Housekeeping? N  Some recent data might be hidden    Patient Care Team: Mar Daring, PA-C as PCP - General (Family Medicine) Dasher, Rayvon Char, MD as Consulting Physician (Dermatology) Yolonda Kida, MD as Consulting Physician (Cardiology) Estill Cotta, MD as Consulting Physician (Ophthalmology) Kipp Laurence, MD as Referring Physician (Audiology) Grant Fontana, [place] as Referring Physician (Chiropractic Medicine)    Assessment:   This is a routine wellness examination for [place].  Exercise Activities and Dietary recommendations Current Exercise Habits: Home exercise routine, Type of exercise: walking, Time (Minutes): 15(to 30 minutes), Frequency (Times/Week): 7, Weekly Exercise (Minutes/Week): 105, Intensity: Mild, Exercise limited by: orthopedic condition(s)  Goals    . DIET - INCREASE WATER INTAKE     Recommend increasing water intake to 6-8 glasses a day.     . Exercise 150 minutes per week (moderate activity)       Fall Risk Fall Risk  11/09/2017 11/05/2016 11/01/2015 10/30/2014  Falls in the past year? No Yes Yes No  Number falls in past yr: - 1 1 -  Injury with Fall? - Yes Yes -   Is the patient's home free of loose throw rugs in walkways, pet  beds, electrical cords, etc?   yes      Grab bars in the bathroom? no      Handrails on the stairs?   yes      Adequate lighting?   yes  Timed Get Up and Go performed: N/A  Depression Screen PHQ 2/9 Scores 11/09/2017 11/05/2016 11/01/2015 10/30/2014  PHQ - 2 Score 0 0 0 0  PHQ- 9 Score 0 0 - -     Cognitive Function     6CIT Screen 11/09/2017  What Year? 0 points  What month? 0 points  What time? 0 points  Count back from 20 0 points  Months in reverse 0 points  Repeat phrase 0 points  Total Score 0    Immunization History  Administered Date(s) Administered  . Influenza Split 03/03/2012  . Influenza-Unspecified 01/18/2015  . Pneumococcal Conjugate-13 07/04/2013  . Pneumococcal Polysaccharide-23 09/30/2011  . Td 09/21/2008  . Tdap 09/21/2008  . Zoster Recombinat (Shingrix) 09/02/2016, 12/17/2016    Qualifies for Shingles Vaccine? Up to date  Screening Tests Health Maintenance  Topic Date Due  . Hepatitis C Screening  Sep 23, 1945  . INFLUENZA VACCINE  12/17/2017  . TETANUS/TDAP  09/22/2018  . MAMMOGRAM  11/27/2018  . Fecal DNA (Cologuard)  11/25/2019  . DEXA SCAN  Completed  . PNA vac Low Risk Adult  Completed    Cancer Screenings: Lung: Low Dose CT Chest recommended if Age 77-80 years, 30 pack-year currently smoking OR have quit w/in 15years. Patient does not qualify. Breast:  Up to date on Mammogram? Yes   Up to date of Bone Density/Dexa? Yes Colorectal: Up to date  Additional Screenings:  Hepatitis C Screening: Pt would like this added onto yearly BW orders at next OV.     Plan:  I have personally reviewed and addressed the Medicare Annual Wellness questionnaire and have noted the  following in the patient's chart:  A. Medical and social history B. Use of alcohol, tobacco or illicit drugs  C. Current medications and supplements D. Functional ability and status E.  Nutritional status F.  Physical activity G. Advance directives H. List of other  physicians I.  Hospitalizations, surgeries, and ER visits in previous 12 months J.  [place] such as hearing and vision if needed, cognitive and depression L. Referrals and appointments - none  In addition, I have reviewed and discussed with patient certain preventive protocols, quality metrics, and best practice recommendations. A written personalized care plan for preventive services as well as general preventive health recommendations were provided to patient.  See attached scanned questionnaire for additional information.   Signed,  Fabio Neighbors, LPN Nurse Health Advisor   Nurse Recommendations: Pt wanted the Hep C lab added to yearly BW orders at next OV.

## 2017-11-09 NOTE — Patient Instructions (Addendum)
Elizabeth [name] , Thank you for taking time to come for your Medicare Wellness Visit. I appreciate your ongoing commitment to your health goals. Please review the following plan we discussed and let me know if I can assist you in the future.   Screening recommendations/referrals: Colonoscopy: Up to date Mammogram: Up to date Bone Density: Up to date Recommended yearly ophthalmology/optometry visit for glaucoma screening and checkup Recommended yearly dental visit for hygiene and checkup  Vaccinations: Influenza vaccine: N/A Pneumococcal vaccine: Up to date Tdap vaccine: Up to date Shingles vaccine: Up to date    Advanced directives: Please bring a copy of your POA (Power of [place]) and/or Living Will to your next appointment.   Conditions/risks identified: Obesity; Recommend increasing water intake to 6-8 glasses a day.   Next appointment: 12/01/17 @ 2 PM with Fenton Malling.   Preventive Care 73 Years and Older, Female Preventive care refers to lifestyle choices and visits with your health care provider that can promote health and wellness. What does preventive care include?  A yearly physical exam. This is also called an annual well check.  Dental exams once or twice a year.  Routine eye exams. Ask your health care provider how often you should have your eyes checked.  Personal lifestyle choices, including:  Daily care of your teeth and gums.  Regular physical activity.  Eating a healthy diet.  Avoiding tobacco and drug use.  Limiting alcohol use.  Practicing safe sex.  Taking low-dose aspirin every day.  Taking vitamin and mineral supplements as recommended by your health care provider. What happens during an annual well check? The services and screenings done by your health care provider during your annual well check will depend on your age, overall health, lifestyle risk factors, and family history of disease. Counseling  Your health care provider may ask you  questions about your:  Alcohol use.  Tobacco use.  Drug use.  Emotional well-being.  Home and relationship well-being.  Sexual activity.  Eating habits.  History of falls.  Memory and ability to understand (cognition).  Work and work Statistician.  Reproductive health. Screening  You may have the following tests or measurements:  Height, weight, and BMI.  Blood pressure.  Lipid and cholesterol levels. These may be checked every 5 years, or more frequently if you are over 67 years old.  Skin check.  Lung cancer screening. You may have this screening every year starting at age 73 if you have a 30-pack-year history of smoking and currently smoke or have quit within the past 15 years.  Fecal occult blood test (FOBT) of the stool. You may have this test every year starting at age 73.  Flexible sigmoidoscopy or colonoscopy. You may have a sigmoidoscopy every 5 years or a colonoscopy every 10 years starting at age 73.  Hepatitis C blood test.  Hepatitis B blood test.  Sexually transmitted disease (STD) testing.  Diabetes screening. This is done by checking your blood sugar (glucose) after you have not eaten for a while (fasting). You may have this done every 1-3 years.  Bone density scan. This is done to screen for osteoporosis. You may have this done starting at age 73.  Mammogram. This may be done every 1-2 years. Talk to your health care provider about how often you should have regular mammograms. Talk with your health care provider about your test results, treatment options, and if necessary, the need for more tests. Vaccines  Your health care provider may recommend certain vaccines,  such as:  Influenza vaccine. This is recommended every year.  Tetanus, diphtheria, and acellular pertussis (Tdap, Td) vaccine. You may need a Td booster every 73 years.  Zoster vaccine. You may need this after age 73.  Pneumococcal 13-valent conjugate (PCV13) vaccine. One dose is  recommended after age 28.  Pneumococcal polysaccharide (PPSV23) vaccine. One dose is recommended after age 73. Talk to your health care provider about which screenings and vaccines you need and how often you need them. This information is not intended to replace advice given to you by your health care provider. Make sure you discuss any questions you have with your health care provider. Document Released: 06/01/2015 Document Revised: 01/23/2016 Document Reviewed: 03/06/2015 Elsevier Interactive Patient Education  2017 [place] Prevention in the Home Falls can cause injuries. They can happen to people of all ages. There are many things you can do to make your home safe and to help prevent falls. What can I do on the outside of my home?  Regularly fix the edges of walkways and driveways and fix any cracks.  Remove anything that might make you trip as you walk through a door, such as a raised step or threshold.  Trim any bushes or trees on the path to your home.  Use bright outdoor lighting.  Clear any walking paths of anything that might make someone trip, such as rocks or tools.  Regularly check to see if handrails are loose or broken. Make sure that both sides of any steps have handrails.  Any raised decks and porches should have guardrails on the edges.  Have any leaves, snow, or ice cleared regularly.  Use sand or salt on walking paths during winter.  Clean up any spills in your garage right away. This includes oil or grease spills. What can I do in the bathroom?  Use night lights.  Install grab bars by the toilet and in the tub and shower. Do not use towel bars as grab bars.  Use non-skid mats or decals in the tub or shower.  If you need to sit down in the shower, use a plastic, non-slip stool.  Keep the floor dry. Clean up any water that spills on the floor as soon as it happens.  Remove soap buildup in the tub or shower regularly.  Attach bath mats  securely with double-sided non-slip rug tape.  Do not have throw rugs and other things on the floor that can make you trip. What can I do in the bedroom?  Use night lights.  Make sure that you have a light by your bed that is easy to reach.  Do not use any sheets or blankets that are too big for your bed. They should not hang down onto the floor.  Have a firm chair that has side arms. You can use this for support while you get dressed.  Do not have throw rugs and other things on the floor that can make you trip. What can I do in the kitchen?  Clean up any spills right away.  Avoid walking on wet floors.  Keep items that you use a lot in easy-to-reach places.  If you need to reach something above you, use a strong step stool that has a grab bar.  Keep electrical cords out of the way.  Do not use floor polish or wax that makes floors slippery. If you must use wax, use non-skid floor wax.  Do not have throw rugs and other things on  the floor that can make you trip. What can I do with my stairs?  Do not leave any items on the stairs.  Make sure that there are handrails on both sides of the stairs and use them. Fix handrails that are broken or loose. Make sure that handrails are as long as the stairways.  Check any carpeting to make sure that it is firmly attached to the stairs. Fix any carpet that is loose or worn.  Avoid having throw rugs at the top or bottom of the stairs. If you do have throw rugs, attach them to the floor with carpet tape.  Make sure that you have a light switch at the top of the stairs and the bottom of the stairs. If you do not have them, ask someone to add them for you. What else can I do to help prevent falls?  Wear shoes that:  Do not have high heels.  Have rubber bottoms.  Are comfortable and fit you well.  Are closed at the toe. Do not wear sandals.  If you use a stepladder:  Make sure that it is fully opened. Do not climb a closed  stepladder.  Make sure that both sides of the stepladder are locked into place.  Ask someone to hold it for you, if possible.  Clearly mark and make sure that you can see:  Any grab bars or handrails.  First and last steps.  Where the edge of each step is.  Use tools that help you move around (mobility aids) if they are needed. These include:  Canes.  Walkers.  Scooters.  Crutches.  Turn on the lights when you go into a dark area. Replace any light bulbs as soon as they burn out.  Set up your furniture so you have a clear path. Avoid moving your furniture around.  If any of your floors are uneven, fix them.  If there are any pets around you, be aware of where they are.  Review your medicines with your doctor. Some medicines can make you feel dizzy. This can increase your chance of falling. Ask your doctor what other things that you can do to help prevent falls. This information is not intended to replace advice given to you by your health care provider. Make sure you discuss any questions you have with your health care provider. Document Released: 03/01/2009 Document Revised: 10/11/2015 Document Reviewed: 06/09/2014 Elsevier Interactive Patient Education  2017 [employer].

## 2017-11-09 NOTE — Progress Notes (Signed)
Labs ordered.

## 2017-11-10 ENCOUNTER — Other Ambulatory Visit: Payer: Self-pay | Admitting: Physician Assistant

## 2017-11-10 ENCOUNTER — Ambulatory Visit
Admission: RE | Admit: 2017-11-10 | Discharge: 2017-11-10 | Disposition: A | Discharge status: Home / Self Care | Payer: Medicare Other | Source: Ambulatory Visit | Attending: Gastroenterology | Admitting: Gastroenterology

## 2017-11-10 DIAGNOSIS — Z79899 Other long term (current) drug therapy: Secondary | ICD-10-CM | POA: Diagnosis not present

## 2017-11-10 DIAGNOSIS — Z5181 Encounter for therapeutic drug level monitoring: Secondary | ICD-10-CM | POA: Insufficient documentation

## 2017-11-11 LAB — BASIC METABOLIC PANEL
BUN/Creatinine Ratio: 40 — ABNORMAL HIGH (ref 12–28)
BUN: 19 mg/dL (ref 8–27)
CALCIUM: 11 mg/dL — AB (ref 8.7–10.3)
CHLORIDE: 105 mmol/L (ref 96–106)
CO2: 24 mmol/L (ref 20–29)
CREATININE: 0.48 mg/dL — AB (ref 0.57–1.00)
GFR calc Af Amer: 113 mL/min/{1.73_m2} (ref >59)
GFR calc non Af Amer: 98 mL/min/{1.73_m2} (ref >59)
GLUCOSE: 96 mg/dL (ref 65–99)
Potassium: 4.2 mmol/L (ref 3.5–5.2)
Sodium: 142 mmol/L (ref 134–144)

## 2017-11-11 LAB — CBC WITH DIFFERENTIAL/PLATELET
BASOS ABS: 0.1 10*3/uL (ref 0.0–0.2)
Basos: 1 %
EOS (ABSOLUTE): 0.2 10*3/uL (ref 0.0–0.4)
EOS: 3 %
HEMATOCRIT: 41.9 % (ref 34.0–46.6)
HEMOGLOBIN: 14.4 g/dL (ref 11.1–15.9)
Immature Grans (Abs): 0 10*3/uL (ref 0.0–0.1)
Immature Granulocytes: 0 %
Lymphocytes Absolute: 2.8 10*3/uL (ref 0.7–3.1)
Lymphs: 33 %
MCH: 33 pg (ref 26.6–33.0)
MCHC: 34.4 g/dL (ref 31.5–35.7)
MCV: 96 fL (ref 79–97)
MONOCYTES: 8 %
Monocytes Absolute: 0.6 10*3/uL (ref 0.1–0.9)
Neutrophils Absolute: 4.7 10*3/uL (ref 1.4–7.0)
Neutrophils: 55 %
Platelets: 241 10*3/uL (ref 150–450)
RBC: 4.37 x10E6/uL (ref 3.77–5.28)
RDW: 13.6 % (ref 12.3–15.4)
WBC: 8.5 10*3/uL (ref 3.4–10.8)

## 2017-11-11 LAB — HEPATIC FUNCTION PANEL
ALT: 24 IU/L (ref 0–32)
AST: 23 IU/L (ref 0–40)
Albumin: 4.3 g/dL (ref 3.5–4.8)
Alkaline Phosphatase: 65 IU/L (ref 39–117)
Bilirubin Total: 0.5 mg/dL (ref 0.0–1.2)
Bilirubin, Direct: 0.13 mg/dL (ref 0.00–0.40)
TOTAL PROTEIN: 6.6 g/dL (ref 6.0–8.5)

## 2017-11-11 LAB — PROTIME-INR
INR: 1 (ref 0.8–1.2)
PROTHROMBIN TIME: 10.2 s (ref 9.1–12.0)

## 2017-11-11 LAB — HEMOGLOBIN A1C
Est. average glucose Bld gHb Est-mCnc: 103 mg/dL
HEMOGLOBIN A1C: 5.2 % (ref 4.8–5.6)

## 2017-11-11 LAB — B12 AND FOLATE PANEL: VITAMIN B 12: 481 pg/mL (ref 232–1245)

## 2017-11-15 LAB — QUANTIFERON-TB GOLD PLUS
QUANTIFERON NIL VALUE: 0.03 [IU]/mL
QuantiFERON TB1 Ag Value: 0.02 IU/mL
QuantiFERON TB2 Ag Value: 0.02 IU/mL
QuantiFERON-TB Gold Plus: NEGATIVE

## 2017-11-16 ENCOUNTER — Telehealth: Payer: Self-pay

## 2017-11-16 NOTE — Telephone Encounter (Signed)
Pt's husband (On DPR) advised.   Thanks,   -Mickel Baas

## 2017-11-16 NOTE — Telephone Encounter (Signed)
-----   Message from Mar Daring, [place] sent at 11/16/2017  9:08 AM EDT ----- Quantiferon gold screen for TB is negative

## 2017-11-16 NOTE — Telephone Encounter (Signed)
[place]  Thanks,  -Joseline

## 2017-12-01 ENCOUNTER — Encounter: Payer: Self-pay | Admitting: Physician Assistant

## 2017-12-01 ENCOUNTER — Ambulatory Visit (INDEPENDENT_AMBULATORY_CARE_PROVIDER_SITE_OTHER): Payer: Medicare Other | Admitting: Physician Assistant

## 2017-12-01 VITALS — BP 124/62 | HR 80 | Temp 98.0°F | Resp 16 | Ht 67.0 in | Wt 207.0 lb

## 2017-12-01 DIAGNOSIS — Z0000 Encounter for general adult medical examination without abnormal findings: Secondary | ICD-10-CM | POA: Diagnosis not present

## 2017-12-01 DIAGNOSIS — Z1231 Encounter for screening mammogram for malignant neoplasm of breast: Secondary | ICD-10-CM | POA: Diagnosis not present

## 2017-12-01 DIAGNOSIS — Z1239 Encounter for other screening for malignant neoplasm of breast: Secondary | ICD-10-CM

## 2017-12-01 NOTE — Patient Instructions (Signed)
Health Maintenance for Postmenopausal Women Menopause is a normal process in which your reproductive ability comes to an end. This process happens gradually over a span of months to years, usually between the ages of 22 and 9. Menopause is complete when you have missed 12 consecutive menstrual periods. It is important to talk with your health care provider about some of the most common conditions that affect postmenopausal women, such as heart disease, cancer, and bone loss (osteoporosis). Adopting a healthy lifestyle and getting preventive care can help to promote your health and wellness. Those actions can also lower your chances of developing some of these common conditions. What should I know about menopause? During menopause, you may experience a number of symptoms, such as:  Moderate-to-severe hot flashes.  Night sweats.  Decrease in sex drive.  Mood swings.  Headaches.  Tiredness.  Irritability.  Memory problems.  Insomnia.  Choosing to treat or not to treat menopausal changes is an individual decision that you make with your health care provider. What should I know about hormone replacement therapy and supplements? Hormone therapy products are effective for treating symptoms that are associated with menopause, such as hot flashes and night sweats. Hormone replacement carries certain risks, especially as you become older. If you are thinking about using estrogen or estrogen with progestin treatments, discuss the benefits and risks with your health care provider. What should I know about heart disease and stroke? Heart disease, heart attack, and stroke become more likely as you age. This may be due, in part, to the hormonal changes that your body experiences during menopause. These can affect how your body processes dietary fats, triglycerides, and cholesterol. Heart attack and stroke are both medical emergencies. There are many things that you can do to help prevent heart disease  and stroke:  Have your blood pressure checked at least every 1-2 years. High blood pressure causes heart disease and increases the risk of stroke.  If you are 53-22 years old, ask your health care provider if you should take aspirin to prevent a heart attack or a stroke.  Do not use any tobacco products, including cigarettes, chewing tobacco, or electronic cigarettes. If you need help quitting, ask your health care provider.  It is important to eat a healthy diet and maintain a healthy weight. ? Be sure to include plenty of vegetables, fruits, low-fat dairy products, and lean protein. ? Avoid eating foods that are high in solid fats, added sugars, or salt (sodium).  Get regular exercise. This is one of the most important things that you can do for your health. ? Try to exercise for at least 150 minutes each week. The type of exercise that you do should increase your heart rate and make you sweat. This is known as moderate-intensity exercise. ? Try to do strengthening exercises at least twice each week. Do these in addition to the moderate-intensity exercise.  Know your numbers.Ask your health care provider to check your cholesterol and your blood glucose. Continue to have your blood tested as directed by your health care provider.  What should I know about cancer screening? There are several types of cancer. Take the following steps to reduce your risk and to catch any cancer development as early as possible. Breast Cancer  Practice breast self-awareness. ? This means understanding how your breasts normally appear and feel. ? It also means doing regular breast self-exams. Let your health care provider know about any changes, no matter how small.  If you are 40  or older, have a clinician do a breast exam (clinical breast exam or CBE) every year. Depending on your age, family history, and medical history, it may be recommended that you also have a yearly breast X-ray (mammogram).  If you  have a family history of breast cancer, talk with your health care provider about genetic screening.  If you are at high risk for breast cancer, talk with your health care provider about having an MRI and a mammogram every year.  Breast cancer (BRCA) gene test is recommended for women who have family members with BRCA-related cancers. Results of the assessment will determine the need for genetic counseling and BRCA1 and for BRCA2 testing. BRCA-related cancers include these types: ? Breast. This occurs in males or females. ? Ovarian. ? Tubal. This may also be called fallopian tube cancer. ? Cancer of the abdominal or pelvic lining (peritoneal cancer). ? Prostate. ? Pancreatic.  Cervical, Uterine, and Ovarian Cancer Your health care provider may recommend that you be screened regularly for cancer of the pelvic organs. These include your ovaries, uterus, and vagina. This screening involves a pelvic exam, which includes checking for microscopic changes to the surface of your cervix (Pap test).  For women ages 21-65, health care providers may recommend a pelvic exam and a Pap test every three years. For women ages 79-65, they may recommend the Pap test and pelvic exam, combined with testing for human papilloma virus (HPV), every five years. Some types of HPV increase your risk of cervical cancer. Testing for HPV may also be done on women of any age who have unclear Pap test results.  Other health care providers may not recommend any screening for nonpregnant women who are considered low risk for pelvic cancer and have no symptoms. Ask your health care provider if a screening pelvic exam is right for you.  If you have had past treatment for cervical cancer or a condition that could lead to cancer, you need Pap tests and screening for cancer for at least 20 years after your treatment. If Pap tests have been discontinued for you, your risk factors (such as having a new sexual partner) need to be  reassessed to determine if you should start having screenings again. Some women have medical problems that increase the chance of getting cervical cancer. In these cases, your health care provider may recommend that you have screening and Pap tests more often.  If you have a family history of uterine cancer or ovarian cancer, talk with your health care provider about genetic screening.  If you have vaginal bleeding after reaching menopause, tell your health care provider.  There are currently no reliable tests available to screen for ovarian cancer.  Lung Cancer Lung cancer screening is recommended for adults 69-62 years old who are at high risk for lung cancer because of a history of smoking. A yearly low-dose CT scan of the lungs is recommended if you:  Currently smoke.  Have a history of at least 30 pack-years of smoking and you currently smoke or have quit within the past 15 years. A pack-year is smoking an average of one pack of cigarettes per day for one year.  Yearly screening should:  Continue until it has been 15 years since you quit.  Stop if you develop a health problem that would prevent you from having lung cancer treatment.  Colorectal Cancer  This type of cancer can be detected and can often be prevented.  Routine colorectal cancer screening usually begins at  age 42 and continues through age 45.  If you have risk factors for colon cancer, your health care provider may recommend that you be screened at an earlier age.  If you have a family history of colorectal cancer, talk with your health care provider about genetic screening.  Your health care provider may also recommend using home test kits to check for hidden blood in your stool.  A small camera at the end of a tube can be used to examine your colon directly (sigmoidoscopy or colonoscopy). This is done to check for the earliest forms of colorectal cancer.  Direct examination of the colon should be repeated every  5-10 years until age 71. However, if early forms of precancerous polyps or small growths are found or if you have a family history or genetic risk for colorectal cancer, you may need to be screened more often.  Skin Cancer  Check your skin from head to toe regularly.  Monitor any moles. Be sure to tell your health care provider: ? About any new moles or changes in moles, especially if there is a change in a mole's shape or color. ? If you have a mole that is larger than the size of a pencil eraser.  If any of your family members has a history of skin cancer, especially at a young age, talk with your health care provider about genetic screening.  Always use sunscreen. Apply sunscreen liberally and repeatedly throughout the day.  Whenever you are outside, protect yourself by wearing long sleeves, pants, a wide-brimmed hat, and sunglasses.  What should I know about osteoporosis? Osteoporosis is a condition in which bone destruction happens more quickly than new bone creation. After menopause, you may be at an increased risk for osteoporosis. To help prevent osteoporosis or the bone fractures that can happen because of osteoporosis, the following is recommended:  If you are 46-71 years old, get at least 1,000 mg of calcium and at least 600 mg of vitamin D per day.  If you are older than age 55 but younger than age 65, get at least 1,200 mg of calcium and at least 600 mg of vitamin D per day.  If you are older than age 54, get at least 1,200 mg of calcium and at least 800 mg of vitamin D per day.  Smoking and excessive alcohol intake increase the risk of osteoporosis. Eat foods that are rich in calcium and vitamin D, and do weight-bearing exercises several times each week as directed by your health care provider. What should I know about how menopause affects my mental health? Depression may occur at any age, but it is more common as you become older. Common symptoms of depression  include:  Low or sad mood.  Changes in sleep patterns.  Changes in appetite or eating patterns.  Feeling an overall lack of motivation or enjoyment of activities that you previously enjoyed.  Frequent crying spells.  Talk with your health care provider if you think that you are experiencing depression. What should I know about immunizations? It is important that you get and maintain your immunizations. These include:  Tetanus, diphtheria, and pertussis (Tdap) booster vaccine.  Influenza every year before the flu season begins.  Pneumonia vaccine.  Shingles vaccine.  Your health care provider may also recommend other immunizations. This information is not intended to replace advice given to you by your health care provider. Make sure you discuss any questions you have with your health care provider. Document Released: 06/27/2005  Document Revised: 11/23/2015 Document Reviewed: 02/06/2015 Elsevier Interactive Patient Education  2018 [employer]  

## 2017-12-01 NOTE — Progress Notes (Signed)
Patient: Elizabeth [name], Female    DOB: 1945-09-24, 72 y.o.   MRN: [id number] Visit Date: 12/01/2017  Today's Provider: Mar Daring, PA-C   Chief Complaint  Patient presents with  . Medicare Wellness   Subjective:    Annual Physical Exam Elizabeth [name] is a 72 y.o. female. She feels fairly well. She is c/o some arthralgias, and would like to discuss if aspirin therapy is appropriate for her. She reports exercising some; walks (when the weather is not too hot) and stretches. She reports she is sleeping well.  Last AWV- 11/05/2016 Last Cologuard- 11/27/2016- Negative Last mammogram- 11/26/2016- BI-RADS 1 -----------------------------------------------------------   Review of Systems  Constitutional: Negative.   HENT: Positive for hearing loss, postnasal drip, sneezing and tinnitus. Negative for congestion, dental problem, drooling, ear discharge, ear pain, facial swelling, mouth sores, nosebleeds, rhinorrhea, sinus pressure, sinus pain, sore throat, trouble swallowing and voice change.   Eyes: Positive for photophobia. Negative for pain, discharge, redness, itching and visual disturbance.  Respiratory: Positive for wheezing. Negative for apnea, cough, choking, chest tightness, shortness of breath and stridor.   Cardiovascular: Negative.   Gastrointestinal: Negative.   Endocrine: Positive for polyuria. Negative for cold intolerance, heat intolerance, polydipsia and polyphagia.  Genitourinary: Negative.   Musculoskeletal: Positive for arthralgias, back pain and neck stiffness. Negative for gait problem, joint swelling, myalgias and neck pain.  Skin: Positive for rash. Negative for color change, pallor and wound.  Allergic/Immunologic: Positive for environmental allergies. Negative for food allergies and immunocompromised state.  Neurological: Negative.   Hematological: Negative for adenopathy. Bruises/bleeds easily.  Psychiatric/Behavioral: Negative.     Social History     Socioeconomic History  . Marital status: Married    Spouse name: Eulas Post  . Number of children: 2  . Years of education: Not on file  . Highest education level: Bachelor's degree (e.g., BA, AB, BS)  Occupational History  . Occupation: retired  Scientific laboratory technician  . Financial resource strain: Not hard at all  . Food insecurity:    Worry: Never true    Inability: Never true  . Transportation needs:    Medical: No    Non-medical: No  Tobacco Use  . Smoking status: Former Smoker    Packs/day: 0.50    Years: 35.00    Pack years: 17.50    Last attempt to quit: 05/20/2003    Years since quitting: 14.5  . Smokeless tobacco: Never Used  Substance and Sexual Activity  . Alcohol use: Yes    Alcohol/week: 4.2 oz    Types: 7 Glasses of wine per week    Comment: occasional  . Drug use: No  . Sexual activity: Not on file  Lifestyle  . Physical activity:    Days per week: Not on file    Minutes per session: Not on file  . Stress: Not at all  Relationships  . Social connections:    Talks on phone: Not on file    Gets together: Not on file    Attends religious service: Not on file    Active member of club or organization: Not on file    Attends meetings of clubs or organizations: Not on file    Relationship status: Not on file  . Intimate partner violence:    Fear of current or ex partner: Not on file    Emotionally abused: Not on file    Physically abused: Not on file    Forced sexual activity: Not  on file  Other Topics Concern  . Not on file  Social History Narrative  . Not on file    Past Medical History:  Diagnosis Date  . Allergy   . Cancer ([place]) 09/2014   Skin Cancer  . Hyperlipidemia   . Hypertension      Patient Active Problem List   Diagnosis Date Noted  . 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 Allergic rhinitis 09/20/2014  . 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 Colitis 09/20/2014  . Dermatitis, eczematoid 09/20/2014  . Elevated hemoglobin ([place]) 09/20/2014  .  Calcium blood increased 09/20/2014  . Blood glucose elevated 09/20/2014  . Hypertension 09/20/2014  . Hypertriglyceridemia 09/20/2014    Past Surgical History:  Procedure Laterality Date  . ABDOMINAL HYSTERECTOMY  1989   due to menorrhagia  . DILATION AND CURETTAGE OF UTERUS  1977  . SKIN CANCER EXCISION  10/2014   on chest; Dr. Evorn Gong  . TRIGGER FINGER RELEASE Right 2000  . WRIST ARTHROPLASTY Left 1991    Her family history includes Arthritis in her mother; Breast cancer (age of onset: 5) in her paternal aunt; COPD in her father; Cancer in her brother, brother, and father; Cerebrovascular Disease in her mother; Heart disease in her mother; Hypertension in her father; Vascular Disease in her mother.      Current Outpatient Medications:  .  cetirizine (ZYRTEC) 10 MG tablet, TAKE 1 TABLET BY MOUTH ONCE A DAY, Disp: 90 tablet, Rfl: 1 .  cholecalciferol (VITAMIN D) 1000 units tablet, Take 1,000 Units by mouth daily., Disp: , Rfl:  .  CINNAMON PO, Take by mouth., Disp: , Rfl:  .  clobetasol cream (TEMOVATE) 9.50 %, Apply 1 application topically as needed. , Disp: , Rfl:  .  DULoxetine (CYMBALTA) 30 MG capsule, TAKE 1 CAPSULE BY MOUTH DAILY AS NEEDED (Patient taking differently: Take 30 mg by mouth daily. ), Disp: 90 capsule, Rfl: 3 .  fluticasone (FLONASE) 50 MCG/ACT nasal spray, Place 2 sprays into both nostrils daily. (Patient taking differently: Place 2 sprays into both nostrils daily. ), Disp: 16 g, Rfl: 11 .  Fluticasone-Salmeterol (ADVAIR DISKUS) 250-50 MCG/DOSE AEPB, INHALE ONE PUFF INTO LUNGS EVERY 12 HOURS TWICE A DAY. RINSE MOUTH AFTER USE. (Patient taking differently: Inhale 1 puff into the lungs. INHALE ONE PUFF INTO LUNGS EVERY 12 HOURS TWICE A DAY. RINSE MOUTH AFTER USE.), Disp: 60 each, Rfl: 5 .  folic acid (FOLVITE) 1 MG tablet, Take 1 mg by mouth daily. , Disp: , Rfl:  .  Ginger, Zingiber officinalis, (GINGER PO), Take by mouth., Disp: , Rfl:  .  hydrochlorothiazide  (MICROZIDE) 12.5 MG capsule, TAKE 1 CAPSULE BY MOUTH ONCE DAILY, Disp: 90 capsule, Rfl: 3 .  losartan (COZAAR) 100 MG tablet, TAKE 1 TABLET BY MOUTH ONCE DAILY (Patient taking differently: TAKE 1 TABLET BY MOUTH ONE DAILY), Disp: 90 tablet, Rfl: 3 .  methotrexate 2.5 MG tablet, Take 5 tablets by mouth once a week., Disp: , Rfl:  .  metoprolol succinate (TOPROL-XL) 50 MG 24 hr tablet, TAKE 1 TABLET BY MOUTH TWICE A DAY WITH OR IMMEDIATELY FOLLOWING A MEAL., Disp: 180 tablet, Rfl: 3 .  MILK THISTLE PO, Take by mouth., Disp: , Rfl:  .  Misc Natural Products (HERBAL ENERGY COMPLEX PO), Take by mouth., Disp: , Rfl:  .  montelukast (SINGULAIR) 10 MG tablet, TAKE 1 TABLET BY MOUTH ONCE DAILY, Disp: 90 tablet, Rfl: 3 .  Multiple Vitamin tablet, Take 1 tablet by mouth daily. , Disp: ,  Rfl:  .  Multiple Vitamins-Minerals (PRESERVISION AREDS PO), Take by mouth daily. , Disp: , Rfl:  .  Multiple Vitamins-Minerals (ZINC PO), Take by mouth., Disp: , Rfl:  .  Probiotic Product (PROBIOTIC-10 PO), Take by mouth daily., Disp: , Rfl:  .  TURMERIC PO, Take by mouth., Disp: , Rfl:  .  vitamin E 1000 UNIT capsule, Take 1,000 Units by mouth daily., Disp: , Rfl:   Patient Care Team: Mar Daring, PA-C as PCP - General (Family Medicine) Dasher, Rayvon Char, MD as Consulting Physician (Dermatology) Yolonda Kida, MD as Consulting Physician (Cardiology) Dingeldein, Remo Lipps, MD as Consulting Physician (Ophthalmology) Kipp Laurence, MD as Referring Physician (Audiology) Grant Fontana, DC as Referring Physician (Chiropractic Medicine)     Objective:   Vitals: BP 124/62 (BP Location: Left Arm, Patient Position: Sitting, Cuff Size: Normal)   Pulse 80   Temp 98 F (36.7 C) (Oral)   Resp 16   Ht 5\' 7"  (1.702 m)   Wt 207 lb (93.9 kg)   SpO2 92%   BMI 32.42 kg/m   Physical Exam  Activities of Daily Living In your present state of health, do you have any difficulty performing the following activities:  11/09/2017  Hearing? N  Vision? N  Difficulty concentrating or making decisions? N  Walking or climbing stairs? Y  Comment Due to occasional knee pain.  Dressing or bathing? N  Doing errands, shopping? N  Preparing Food and eating ? N  Using the Toilet? N  In the past six months, have you accidently leaked urine? Y  Comment Occasionally with pressure, wears protection.  Do you have problems with loss of bowel control? N  Managing your Medications? N  Managing your Finances? N  Housekeeping or managing your Housekeeping? N  Some recent data might be hidden    Fall Risk Assessment Fall Risk  12/01/2017 11/09/2017 11/05/2016 11/01/2015 10/30/2014  Falls in the past year? No No Yes Yes No  Number falls in past yr: - - 1 1 -  Injury with Fall? - - Yes Yes -     Depression Screen PHQ 2/9 Scores 12/01/2017 11/09/2017 11/05/2016 11/01/2015  PHQ - 2 Score 0 0 0 0  PHQ- 9 Score - 0 0 -    6CIT Screen 11/09/2017  What Year? 0 points  What month? 0 points  What time? 0 points  Count back from 20 0 points  Months in reverse 0 points  Repeat phrase 0 points  Total Score 0      Assessment & Plan:     Annual Wellness Visit  Reviewed patient's Family Medical History Reviewed and updated list of patient's medical providers Assessment of cognitive impairment was done Assessed patient's functional ability Established a written schedule for health screening [place] Completed and Reviewed  Exercise Activities and Dietary recommendations Goals    . DIET - INCREASE WATER INTAKE     Recommend increasing water intake to 6-8 glasses a day.     . Exercise 150 minutes per week (moderate activity)       Immunization History  Administered Date(s) Administered  . Influenza Split 03/03/2012  . Influenza-Unspecified 01/18/2015  . Pneumococcal Conjugate-13 07/04/2013  . Pneumococcal Polysaccharide-23 09/30/2011  . Td 09/21/2008  . Tdap 09/21/2008  . Zoster Recombinat  (Shingrix) 09/02/2016, 12/17/2016    Health Maintenance  Topic Date Due  . Hepatitis C Screening  [birth year]  . INFLUENZA VACCINE  12/17/2017  . TETANUS/TDAP  09/22/2018  .  MAMMOGRAM  11/27/2018  . Fecal DNA (Cologuard)  11/25/2019  . DEXA SCAN  Completed  . PNA vac Low Risk Adult  Completed     Discussed health benefits of physical activity, and encouraged her to engage in regular exercise appropriate for her age and condition.    1. Annual physical exam Normal physical exam today. Up to date on all screenings and immunizations.   2. Breast cancer screening Breast exam today was normal. There is no family history of breast cancer. She does perform regular self breast exams. Mammogram was ordered as below. Information for [facility] was given to patient so she may schedule her mammogram at her convenience. - MM Digital Screening; Future  ------------------------------------------------------------------------------------------------------------    Mar Daring, PA-C  [facility]

## 2017-12-04 ENCOUNTER — Other Ambulatory Visit: Payer: Self-pay | Admitting: Physician Assistant

## 2017-12-04 DIAGNOSIS — J301 Allergic rhinitis due to pollen: Secondary | ICD-10-CM

## 2017-12-21 ENCOUNTER — Ambulatory Visit
Admission: RE | Admit: 2017-12-21 | Discharge: 2017-12-21 | Disposition: A | Discharge status: Home / Self Care | Payer: Medicare Other | Source: Ambulatory Visit | Attending: Physician Assistant | Admitting: Physician Assistant

## 2017-12-21 DIAGNOSIS — Z1239 Encounter for other screening for malignant neoplasm of breast: Secondary | ICD-10-CM

## 2017-12-21 DIAGNOSIS — Z1231 Encounter for screening mammogram for malignant neoplasm of breast: Secondary | ICD-10-CM | POA: Diagnosis present

## 2017-12-22 ENCOUNTER — Telehealth: Payer: Self-pay

## 2017-12-22 NOTE — Telephone Encounter (Signed)
-----   Message from Mar Daring, [place] sent at 12/22/2017  1:13 PM EDT ----- Normal mammogram. Repeat screening in one year.

## 2017-12-22 NOTE — Telephone Encounter (Signed)
LMTCB 12/22/2017   Thanks,   -Mickel Baas

## 2017-12-23 NOTE — Telephone Encounter (Signed)
Pt advised.   Thanks,   -Tayson Schnelle  

## 2018-01-02 ENCOUNTER — Other Ambulatory Visit: Payer: Self-pay | Admitting: Physician Assistant

## 2018-01-02 DIAGNOSIS — J301 Allergic rhinitis due to pollen: Secondary | ICD-10-CM

## 2018-05-17 ENCOUNTER — Other Ambulatory Visit: Payer: Self-pay | Admitting: Physician Assistant

## 2018-05-17 DIAGNOSIS — J301 Allergic rhinitis due to pollen: Secondary | ICD-10-CM

## 2018-06-11 ENCOUNTER — Other Ambulatory Visit: Payer: Self-pay | Admitting: Physician Assistant

## 2018-06-11 DIAGNOSIS — J301 Allergic rhinitis due to pollen: Secondary | ICD-10-CM

## 2018-06-22 ENCOUNTER — Other Ambulatory Visit: Payer: Self-pay | Admitting: Physician Assistant

## 2018-06-22 DIAGNOSIS — J301 Allergic rhinitis due to pollen: Secondary | ICD-10-CM

## 2018-07-12 ENCOUNTER — Ambulatory Visit: Payer: Medicare Other | Admitting: Physician Assistant

## 2018-07-12 ENCOUNTER — Encounter: Payer: Self-pay | Admitting: Physician Assistant

## 2018-07-12 VITALS — BP 141/83 | HR 73 | Temp 98.5°F | Wt 214.2 lb

## 2018-07-12 DIAGNOSIS — J111 Influenza due to unidentified influenza virus with other respiratory manifestations: Secondary | ICD-10-CM | POA: Diagnosis not present

## 2018-07-12 DIAGNOSIS — R05 Cough: Secondary | ICD-10-CM

## 2018-07-12 DIAGNOSIS — R059 Cough, unspecified: Secondary | ICD-10-CM

## 2018-07-12 NOTE — Patient Instructions (Signed)

## 2018-07-12 NOTE — Progress Notes (Signed)
Patient: Elizabeth [name] Female    DOB: 1946-03-29   73 y.o.   MRN: [id number] Visit Date: 07/12/2018  Today's Provider: Mar Daring, PA-C   Chief Complaint  Patient presents with  . Follow-up   Subjective:    HPI  Follow-up Patient presents today for follow-up due to having the flu on 06/30/2018. Patient was taking amoxicillin 875 mg, Tamiflu 75 and Promethazine- Codeine 6.25-10 mg/ 75ml.   Allergies  Allergen Reactions  . Furadantin  [Nitrofurantoin]   . Phenobarbital Rash     Current Outpatient Medications:  .  cetirizine (ZYRTEC) 10 MG tablet, TAKE 1 TABLET BY MOUTH ONCE A DAY, Disp: 90 tablet, Rfl: 1 .  cholecalciferol (VITAMIN D) 1000 units tablet, Take 1,000 Units by mouth daily., Disp: , Rfl:  .  CINNAMON PO, Take by mouth., Disp: , Rfl:  .  DULoxetine (CYMBALTA) 30 MG capsule, TAKE 1 CAPSULE BY MOUTH DAILY AS NEEDED (Patient taking differently: Take 30 mg by mouth daily. ), Disp: 90 capsule, Rfl: 3 .  fluticasone (FLONASE) 50 MCG/ACT nasal spray, PLACE 2 SPRAYS INTO BOTH NOSTRILS DAILY, Disp: 16 g, Rfl: 1 .  Fluticasone-Salmeterol (ADVAIR DISKUS) 250-50 MCG/DOSE AEPB, INHALE 1 PUFF INTO LUNGS EVERY 12 HOURS (TWICE DAILY) RINSE MOUTH AFTER USE., Disp: 60 each, Rfl: 5 .  folic acid (FOLVITE) 1 MG tablet, Take 1 mg by mouth daily. , Disp: , Rfl:  .  Ginger, Zingiber officinalis, (GINGER PO), Take by mouth., Disp: , Rfl:  .  hydrochlorothiazide (MICROZIDE) 12.5 MG capsule, TAKE 1 CAPSULE BY MOUTH ONCE DAILY, Disp: 90 capsule, Rfl: 3 .  losartan (COZAAR) 100 MG tablet, TAKE 1 TABLET BY MOUTH ONCE DAILY (Patient taking differently: TAKE 1 TABLET BY MOUTH ONE DAILY), Disp: 90 tablet, Rfl: 3 .  metoprolol succinate (TOPROL-XL) 50 MG 24 hr tablet, TAKE 1 TABLET BY MOUTH TWICE A DAY WITH OR IMMEDIATELY FOLLOWING A MEAL., Disp: 180 tablet, Rfl: 3 .  MILK THISTLE PO, Take by mouth., Disp: , Rfl:  .  Misc Natural Products (HERBAL ENERGY COMPLEX PO), Take by mouth., Disp:  , Rfl:  .  montelukast (SINGULAIR) 10 MG tablet, TAKE 1 TABLET BY MOUTH ONCE DAILY, Disp: 90 tablet, Rfl: 3 .  Multiple Vitamin tablet, Take 1 tablet by mouth daily. , Disp: , Rfl:  .  Multiple Vitamins-Minerals (PRESERVISION AREDS PO), Take by mouth daily. , Disp: , Rfl:  .  Multiple Vitamins-Minerals (ZINC PO), Take by mouth., Disp: , Rfl:  .  Probiotic Product (PROBIOTIC-10 PO), Take by mouth daily., Disp: , Rfl:  .  TURMERIC PO, Take by mouth., Disp: , Rfl:  .  vitamin E 1000 UNIT capsule, Take 1,000 Units by mouth daily., Disp: , Rfl:  .  clobetasol cream (TEMOVATE) 7.82 %, Apply 1 application topically as needed. , Disp: , Rfl:  .  methotrexate 2.5 MG tablet, Take 5 tablets by mouth once a week., Disp: , Rfl:   Review of Systems  Constitutional: Negative.   HENT: Negative.   Respiratory: Positive for cough. Negative for chest tightness, shortness of breath and wheezing.   Cardiovascular: Negative.   Gastrointestinal: Negative.   Neurological: Negative.     Social History   Tobacco Use  . Smoking status: Former Smoker    Packs/day: 0.50    Years: 35.00    Pack years: 17.50    Last attempt to quit: 05/20/2003    Years since quitting: 15.1  . Smokeless tobacco: Never  Used  Substance Use Topics  . Alcohol use: Yes    Alcohol/week: 7.0 standard drinks    Types: 7 Glasses of wine per week    Comment: occasional      Objective:   BP (!) 141/83 (BP Location: Left Arm, Patient Position: Sitting, Cuff Size: Large)   Pulse 73   Temp 98.5 F (36.9 C) (Oral)   Wt 214 lb 3.2 oz (97.2 kg)   BMI 33.55 kg/m  Vitals:   07/12/18 1440  BP: (!) 141/83  Pulse: 73  Temp: 98.5 F (36.9 C)  TempSrc: Oral  Weight: 214 lb 3.2 oz (97.2 kg)     Physical Exam Vitals signs reviewed.  Constitutional:      General: She is not in acute distress.    Appearance: She is well-developed. She is not diaphoretic.  HENT:     Head: Normocephalic and atraumatic.     Right Ear: Hearing,  tympanic membrane, ear canal and external ear normal.     Left Ear: Hearing, tympanic membrane, ear canal and external ear normal.     Nose: Nose normal.     Mouth/Throat:     Pharynx: Uvula midline. No oropharyngeal exudate.  Eyes:     General: No scleral icterus.       Right eye: No discharge.        Left eye: No discharge.     Conjunctiva/sclera: Conjunctivae normal.     Pupils: Pupils are equal, round, and reactive to light.  Neck:     Musculoskeletal: Normal range of motion and neck supple.     Thyroid: No thyromegaly.     Trachea: No tracheal deviation.  Cardiovascular:     Rate and Rhythm: Normal rate and regular rhythm.     Heart sounds: Normal heart sounds. No murmur. No friction rub. No gallop.   Pulmonary:     Effort: Pulmonary effort is normal. No respiratory distress.     Breath sounds: Normal breath sounds. No stridor. No wheezing or rales.  Lymphadenopathy:     Cervical: No cervical adenopathy.  Skin:    General: Skin is warm and dry.        Assessment & Plan    1. Influenza Was diagnosed on 06/30/18. Also diagnosed with bronchitis. Completed treatment of tamiflu and amoxil. Reports about 90% better but still some residual cough. Patient has been off her zyrtec and flonase during the illness course. Just restarted on Sunday, yesterday (07/11/18). Discussed post-infective cough as well as post nasal drainage. Advised to push fluids, keep mouth moist with using cough drops prn and hopefully with restarting flonase will decrease the post nasal drip that may be excerbating cough.  Call if symptoms do worsen.   2. Cough See above medical treatment plan.     Mar Daring, PA-C  [facility]

## 2018-07-21 ENCOUNTER — Telehealth: Payer: Self-pay | Admitting: Physician Assistant

## 2018-07-21 DIAGNOSIS — R059 Cough, unspecified: Secondary | ICD-10-CM

## 2018-07-21 DIAGNOSIS — R05 Cough: Secondary | ICD-10-CM

## 2018-07-21 MED ORDER — PROMETHAZINE-DM 6.25-15 MG/5ML PO SYRP: 5.0000 mL | ORAL_SOLUTION | Freq: Four times a day (QID) | ORAL | 0 refills | Status: DC | PRN

## 2018-07-21 NOTE — Telephone Encounter (Signed)
Patient advised as below.  

## 2018-07-21 NOTE — Telephone Encounter (Signed)
Patient called stating her cough has come back from when she had the flu.  She would like a refill on the cough med.  If she needs to come in, please let her know.[place]

## 2018-07-21 NOTE — Telephone Encounter (Signed)
Please advise 

## 2018-07-21 NOTE — Telephone Encounter (Signed)
Refilled cough syrup but will need to be seen if cough persist. Patient at high risk for pneumonia following flu.

## 2018-08-02 ENCOUNTER — Other Ambulatory Visit: Payer: Self-pay | Admitting: Physician Assistant

## 2018-08-02 DIAGNOSIS — J301 Allergic rhinitis due to pollen: Secondary | ICD-10-CM

## 2018-08-05 ENCOUNTER — Other Ambulatory Visit: Payer: Self-pay | Admitting: Physician Assistant

## 2018-08-05 DIAGNOSIS — I10 Essential (primary) hypertension: Secondary | ICD-10-CM

## 2018-08-23 ENCOUNTER — Other Ambulatory Visit: Payer: Self-pay | Admitting: Physician Assistant

## 2018-08-23 DIAGNOSIS — I10 Essential (primary) hypertension: Secondary | ICD-10-CM

## 2018-09-24 ENCOUNTER — Other Ambulatory Visit: Payer: Self-pay | Admitting: Physician Assistant

## 2018-09-29 ENCOUNTER — Other Ambulatory Visit: Payer: Self-pay | Admitting: Physician Assistant

## 2018-09-29 DIAGNOSIS — J301 Allergic rhinitis due to pollen: Secondary | ICD-10-CM

## 2018-09-30 ENCOUNTER — Other Ambulatory Visit: Payer: Self-pay

## 2018-09-30 ENCOUNTER — Ambulatory Visit: Payer: Medicare Other | Admitting: Physician Assistant

## 2018-09-30 ENCOUNTER — Encounter: Payer: Self-pay | Admitting: Physician Assistant

## 2018-09-30 VITALS — BP 150/82 | HR 64 | Temp 97.9°F | Resp 16 | Wt 208.0 lb

## 2018-09-30 DIAGNOSIS — M5431 Sciatica, right side: Secondary | ICD-10-CM | POA: Diagnosis not present

## 2018-09-30 MED ORDER — METHYLPREDNISOLONE 4 MG PO TBPK: ORAL_TABLET | ORAL | 0 refills | Status: DC

## 2018-09-30 MED ORDER — TIZANIDINE HCL 4 MG PO CAPS: 4.0000 mg | ORAL_CAPSULE | Freq: Three times a day (TID) | ORAL | 0 refills | Status: DC | PRN

## 2018-09-30 NOTE — Progress Notes (Signed)
Patient: Elizabeth [name] Female    DOB: [birth year]   73 y.o.   MRN: [id number] Visit Date: 09/30/2018  Today's Provider: Mar Daring, PA-C   Chief Complaint  Patient presents with  . Hip Pain   Subjective:     Hip Pain   The incident occurred more than 1 week ago (2 weeks sgo working in the yard). There was no injury mechanism. The pain is present in the right hip and right leg. The quality of the pain is described as aching. The pain is moderate. The pain has been constant (It does spasm. Reports during the day is 3/10 but by the end of the day it is 10/10) since onset. Pertinent negatives include no numbness or tingling. Associated symptoms comments: It hurts to stay still . She reports no foreign bodies present. The symptoms are aggravated by movement and weight bearing. She has tried ice, heat, acetaminophen and NSAIDs (chiropractor on Tuesday this week. Grant Fontana. 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 The treatment provided mild relief.    Allergies  Allergen Reactions  . Furadantin  [Nitrofurantoin]   . Phenobarbital Rash     Current Outpatient Medications:  .  cetirizine (ZYRTEC) 10 MG tablet, TAKE 1 TABLET BY MOUTH ONCE A DAY, Disp: 90 tablet, Rfl: 1 .  cholecalciferol (VITAMIN D) 1000 units tablet, Take 1,000 Units by mouth daily., Disp: , Rfl:  .  CINNAMON PO, Take by mouth., Disp: , Rfl:  .  DULoxetine (CYMBALTA) 30 MG capsule, TAKE 1 CAPSULE BY MOUTH DAILY AS NEEDED (Patient taking differently: Take 30 mg by mouth daily. ), Disp: 90 capsule, Rfl: 3 .  fluticasone (FLONASE) 50 MCG/ACT nasal spray, PLACE 2 SPRAYS INTO BOTH NOSTRILS DAILY, Disp: 16 g, Rfl: 1 .  Fluticasone-Salmeterol (ADVAIR DISKUS) 250-50 MCG/DOSE AEPB, INHALE 1 PUFF INTO LUNGS EVERY 12 HOURS (TWICE DAILY) RINSE MOUTH AFTER USE., Disp: 60 each, Rfl: 5 .  folic acid (FOLVITE) 1 MG tablet, Take 1 mg by mouth  daily. , Disp: , Rfl:  .  Ginger, Zingiber officinalis, (GINGER PO), Take by mouth., Disp: , Rfl:  .  hydrochlorothiazide (MICROZIDE) 12.5 MG capsule, TAKE 1 CAPSULE BY MOUTH ONCE DAILY, Disp: 90 capsule, Rfl: 3 .  losartan (COZAAR) 100 MG tablet, TAKE 1 TABLET BY MOUTH ONCE A DAY, Disp: 90 tablet, Rfl: 3 .  methotrexate 2.5 MG tablet, Take 5 tablets by mouth once a week., Disp: , Rfl:  .  metoprolol succinate (TOPROL-XL) 50 MG 24 hr tablet, TAKE 1 TABLET BY MOUTH TWICE A DAY WITH OR IMMEDIATELY FOLLOWING A MEAL, Disp: 180 tablet, Rfl: 3 .  Misc Natural Products (HERBAL ENERGY COMPLEX PO), Take by mouth., Disp: , Rfl:  .  montelukast (SINGULAIR) 10 MG tablet, TAKE 1 TABLET BY MOUTH ONCE DAILY, Disp: 90 tablet, Rfl: 3 .  Multiple Vitamin tablet, Take 1 tablet by mouth daily. , Disp: , Rfl:  .  Multiple Vitamins-Minerals (PRESERVISION AREDS PO), Take by mouth daily. , Disp: , Rfl:  .  Multiple Vitamins-Minerals (ZINC PO), Take by mouth., Disp: , Rfl:  .  Probiotic Product (PROBIOTIC-10 PO), Take by mouth daily., Disp: , Rfl:  .  TURMERIC PO, Take by mouth., Disp: , Rfl:  .  vitamin E 1000 UNIT capsule, Take 1,000 Units by mouth daily., Disp: , Rfl:  .  clobetasol cream (TEMOVATE) 1.77 %, Apply 1 application topically as  needed. , Disp: , Rfl:   Review of Systems  Constitutional: Negative.   Respiratory: Negative.   Cardiovascular: Negative.   Musculoskeletal: Positive for arthralgias and myalgias. Negative for back pain.  Neurological: Positive for weakness. Negative for tingling and numbness.    Social History   Tobacco Use  . Smoking status: Former Smoker    Packs/day: 0.50    Years: 35.00    Pack years: 17.50    Last attempt to quit: 05/20/2003    Years since quitting: 15.3  . Smokeless tobacco: Never Used  Substance Use Topics  . Alcohol use: Yes    Alcohol/week: 7.0 standard drinks    Types: 7 Glasses of wine per week    Comment: occasional      Objective:   BP (!) 150/82  (BP Location: Left Arm, Patient Position: Sitting, Cuff Size: Large)   Pulse 64   Temp 97.9 F (36.6 C) (Oral)   Resp 16   Wt 208 lb (94.3 kg)   BMI 32.58 kg/m  Vitals:   09/30/18 1004  BP: (!) 150/82  Pulse: 64  Resp: 16  Temp: 97.9 F (36.6 C)  TempSrc: Oral  Weight: 208 lb (94.3 kg)     Physical Exam Vitals signs reviewed.  Constitutional:      General: She is not in acute distress.    Appearance: Normal appearance. She is well-developed. She is obese. She is not diaphoretic.  Neck:     Musculoskeletal: Normal range of motion and neck supple.  Cardiovascular:     Rate and Rhythm: Normal rate and regular rhythm.     Heart sounds: Normal heart sounds. No murmur. No friction rub. No gallop.   Pulmonary:     Effort: Pulmonary effort is normal. No respiratory distress.     Breath sounds: Normal breath sounds. No wheezing or rales.  Musculoskeletal:     Comments: Point tender in mid gluteal region of right side. Pain with hip flexion from standing. Mild weakness (4+/5) with right hip flexion. All other motions WNL. Neg SLR bilaterally  Neurological:     Mental Status: She is alert.         Assessment & Plan    1. Sciatica of right side Worsening despite conservative measures at home. Will try medrol dose pak and zanaflex (muscle relaxer) as below. Continue stretching and exercises. Continue massage. Continue chiropractic care. Call if still not improving or worsens.  - methylPREDNISolone (MEDROL) 4 MG TBPK tablet; 6 day taper; take as directed on package instructions  Dispense: 21 tablet; Refill: 0 - tiZANidine (ZANAFLEX) 4 MG capsule; Take 1 capsule (4 mg total) by mouth 3 (three) times daily as needed for muscle spasms.  Dispense: 30 capsule; Refill: 0     Mar Daring, PA-C  [facility]

## 2018-09-30 NOTE — Patient Instructions (Signed)
Sciatica Rehab  Ask your health care provider which exercises are safe for you. Do exercises exactly as told by your health care provider and adjust them as directed. It is normal to feel mild stretching, pulling, tightness, or discomfort as you do these exercises, but you should stop right away if you feel sudden pain or your pain gets worse.Do not begin these exercises until told by your health care provider.  Stretching and range of motion exercises  These exercises warm up your muscles and joints and improve the movement and flexibility of your hips and your back. These exercises also help to relieve pain, numbness, and tingling.  Exercise A: Sciatic nerve glide  1. Sit in a chair with your head facing down toward your chest. Place your hands behind your back. Let your shoulders slump forward.  2. Slowly straighten one of your knees while you tilt your head back as if you are looking toward the ceiling. Only straighten your leg as far as you can without making your symptoms worse.  3. Hold for __________ seconds.  4. Slowly return to the starting position.  5. Repeat with your other leg.  Repeat __________ times. Complete this exercise __________ times a day.  Exercise B: Knee to chest with hip adduction and internal rotation    1. Lie on your back on a firm surface with both legs straight.  2. Bend one of your knees and move it up toward your chest until you feel a gentle stretch in your lower back and buttock. Then, move your knee toward the shoulder that is on the opposite side from your leg.  ? Hold your leg in this position by holding onto the front of your knee.  3. Hold for __________ seconds.  4. Slowly return to the starting position.  5. Repeat with your other leg.  Repeat __________ times. Complete this exercise __________ times a day.  Exercise C: Prone extension on elbows    1. Lie on your abdomen on a firm surface. A bed may be too soft for this exercise.  2. Prop yourself up on your  elbows.  3. Use your arms to help lift your chest up until you feel a gentle stretch in your abdomen and your lower back.  ? This will place some of your body weight on your elbows. If this is uncomfortable, try stacking pillows under your chest.  ? Your hips should stay down, against the surface that you are lying on. Keep your hip and back muscles relaxed.  4. Hold for __________ seconds.  5. Slowly relax your upper body and return to the starting position.  Repeat __________ times. Complete this exercise __________ times a day.  Strengthening exercises  These exercises build strength and endurance in your back. Endurance is the ability to use your muscles for a long time, even after they get tired.  Exercise D: Pelvic tilt  1. Lie on your back on a firm surface. Bend your knees and keep your feet flat.  2. Tense your abdominal muscles. Tip your pelvis up toward the ceiling and flatten your lower back into the floor.  ? To help with this exercise, you may place a small towel under your lower back and try to push your back into the towel.  3. Hold for __________ seconds.  4. Let your muscles relax completely before you repeat this exercise.  Repeat __________ times. Complete this exercise __________ times a day.  Exercise E: Alternating arm and leg raises      1. Get on your hands and knees on a firm surface. If you are on a hard floor, you may want to use padding to cushion your knees, such as an exercise mat.  2. Line up your arms and legs. Your hands should be below your shoulders, and your knees should be below your hips.  3. Lift your left leg behind you. At the same time, raise your right arm and straighten it in front of you.  ? Do not lift your leg higher than your hip.  ? Do not lift your arm higher than your shoulder.  ? Keep your abdominal and back muscles tight.  ? Keep your hips facing the ground.  ? Do not arch your back.  ? Keep your balance carefully, and do not hold your breath.  4. Hold for  __________ seconds.  5. Slowly return to the starting position and repeat with your right leg and your left arm.  Repeat __________ times. Complete this exercise __________ times a day.  Posture and body mechanics    Body mechanics refers to the movements and positions of your body while you do your daily activities. Posture is part of body mechanics. Good posture and healthy body mechanics can help to relieve stress in your body's tissues and joints. Good posture means that your spine is in its natural S-curve position (your spine is neutral), your shoulders are pulled back slightly, and your head is not tipped forward. The following are general guidelines for applying improved posture and body mechanics to your everyday activities.  Standing     When standing, keep your spine neutral and your feet about hip-width apart. Keep a slight bend in your knees. Your ears, shoulders, and hips should line up.   When you do a task in which you stand in one place for a long time, place one foot up on a stable object that is 2-4 inches (5-10 cm) high, such as a footstool. This helps keep your spine neutral.  Sitting     When sitting, keep your spine neutral and keep your feet flat on the floor. Use a footrest, if necessary, and keep your thighs parallel to the floor. Avoid rounding your shoulders, and avoid tilting your head forward.   When working at a desk or a computer, keep your desk at a height where your hands are slightly lower than your elbows. Slide your chair under your desk so you are close enough to maintain good posture.   When working at a computer, place your monitor at a height where you are looking straight ahead and you do not have to tilt your head forward or downward to look at the screen.  Resting     When lying down and resting, avoid positions that are most painful for you.   If you have pain with activities such as sitting, bending, stooping, or squatting (flexion-based activities), lie in a  position in which your body does not bend very much. For example, avoid curling up on your side with your arms and knees near your chest (fetal position).   If you have pain with activities such as standing for a long time or reaching with your arms (extension-based activities), lie with your spine in a neutral position and bend your knees slightly. Try the following positions:  ? Lying on your side with a pillow between your knees.  ? Lying on your back with a pillow under your knees.  Lifting     When lifting   objects, keep your feet at least shoulder-width apart and tighten your abdominal muscles.   Bend your knees and hips and keep your spine neutral. It is important to lift using the strength of your legs, not your back. Do not lock your knees straight out.   Always ask for help to lift heavy or awkward objects.  This information is not intended to replace advice given to you by your health care provider. Make sure you discuss any questions you have with your health care provider.  Document Released: 05/05/2005 Document Revised: 01/10/2016 Document Reviewed: 01/19/2015  [employer]vier Interactive Patient Education  2019 [employer]

## 2018-10-08 ENCOUNTER — Other Ambulatory Visit: Payer: Self-pay | Admitting: Physician Assistant

## 2018-10-08 DIAGNOSIS — F4329 Adjustment disorder with other symptoms: Secondary | ICD-10-CM

## 2018-10-12 ENCOUNTER — Other Ambulatory Visit: Payer: Self-pay | Admitting: *Deleted

## 2018-10-12 DIAGNOSIS — M5431 Sciatica, right side: Secondary | ICD-10-CM

## 2018-10-12 MED ORDER — TIZANIDINE HCL 4 MG PO CAPS: 4.0000 mg | ORAL_CAPSULE | Freq: Three times a day (TID) | ORAL | 0 refills | Status: DC | PRN

## 2018-10-12 NOTE — Telephone Encounter (Signed)
Please review

## 2018-10-14 ENCOUNTER — Ambulatory Visit: Payer: Medicare Other | Admitting: Physician Assistant

## 2018-10-14 ENCOUNTER — Ambulatory Visit
Admission: RE | Admit: 2018-10-14 | Discharge: 2018-10-14 | Disposition: A | Discharge status: Home / Self Care | Payer: Medicare Other | Source: Ambulatory Visit | Attending: Physician Assistant | Admitting: Physician Assistant

## 2018-10-14 ENCOUNTER — Other Ambulatory Visit: Payer: Self-pay

## 2018-10-14 VITALS — BP 122/84 | HR 78 | Temp 99.0°F | Resp 16 | Wt 207.2 lb

## 2018-10-14 DIAGNOSIS — S31819D Unspecified open wound of right buttock, subsequent encounter: Secondary | ICD-10-CM | POA: Diagnosis not present

## 2018-10-14 DIAGNOSIS — M25551 Pain in right hip: Secondary | ICD-10-CM | POA: Insufficient documentation

## 2018-10-14 MED ORDER — CELECOXIB 100 MG PO CAPS: 100.0000 mg | ORAL_CAPSULE | Freq: Every day | ORAL | 0 refills | Status: DC

## 2018-10-14 NOTE — Progress Notes (Signed)
Patient: Elizabeth [name] Female    DOB: Oct 08, 1945   73 y.o.   MRN: [id number] Visit Date: 10/14/2018  Today's Provider: Trinna Post, PA-C   Chief Complaint  Patient presents with  . Sciatica   Subjective:     HPI  Follow up for Sciatica  The patient was last seen for this 1 months ago. Changes made at last visit include, starting patient on Medrol and Zanaflex.  She reports good compliance with treatment. She feels that condition is Worse. Patient reports that over the past two weeks she has had a "clicking" sensation in her right hip and states that pain from sciatica is radiating down into her joints. She is not having side effects.  Patient has been seeing chiropractor.   Reports pain in her right buttock, hip and leg. Reports she gets up in the morning and she has no pain. This pain is helped by ibuprofen. Night time she will have constant pain. Mornings will bring about improvement in pain. Medrol and zanaflex did not help much.   Patient reports she used an ice pack on her right buttock and did not use any protective covering. The ice pack pulled some skin off and created a wound. She would like this checked today.  ------------------------------------------------------------------------------------  Allergies  Allergen Reactions  . Furadantin  [Nitrofurantoin]   . Phenobarbital Rash     Current Outpatient Medications:  .  cetirizine (ZYRTEC) 10 MG tablet, TAKE 1 TABLET BY MOUTH ONCE A DAY, Disp: 90 tablet, Rfl: 1 .  cholecalciferol (VITAMIN D) 1000 units tablet, Take 1,000 Units by mouth daily., Disp: , Rfl:  .  CINNAMON PO, Take by mouth., Disp: , Rfl:  .  clobetasol cream (TEMOVATE) 5.80 %, Apply 1 application topically as needed. , Disp: , Rfl:  .  DULoxetine (CYMBALTA) 30 MG capsule, TAKE ONE CAPSULE BY MOUTH DAILY AS NEEDED, Disp: 90 capsule, Rfl: 3 .  fluticasone (FLONASE) 50 MCG/ACT nasal spray, PLACE 2 SPRAYS INTO BOTH NOSTRILS DAILY, Disp: 16 g,  Rfl: 1 .  Fluticasone-Salmeterol (ADVAIR DISKUS) 250-50 MCG/DOSE AEPB, INHALE 1 PUFF INTO LUNGS EVERY 12 HOURS (TWICE DAILY) RINSE MOUTH AFTER USE., Disp: 60 each, Rfl: 5 .  folic acid (FOLVITE) 1 MG tablet, Take 1 mg by mouth daily. , Disp: , Rfl:  .  Ginger, Zingiber officinalis, (GINGER PO), Take by mouth., Disp: , Rfl:  .  hydrochlorothiazide (MICROZIDE) 12.5 MG capsule, TAKE 1 CAPSULE BY MOUTH ONCE DAILY, Disp: 90 capsule, Rfl: 3 .  losartan (COZAAR) 100 MG tablet, TAKE 1 TABLET BY MOUTH ONCE A DAY, Disp: 90 tablet, Rfl: 3 .  methotrexate 2.5 MG tablet, Take 5 tablets by mouth once a week., Disp: , Rfl:  .  methylPREDNISolone (MEDROL) 4 MG TBPK tablet, 6 day taper; take as directed on package instructions, Disp: 21 tablet, Rfl: 0 .  metoprolol succinate (TOPROL-XL) 50 MG 24 hr tablet, TAKE 1 TABLET BY MOUTH TWICE A DAY WITH OR IMMEDIATELY FOLLOWING A MEAL, Disp: 180 tablet, Rfl: 3 .  Misc Natural Products (HERBAL ENERGY COMPLEX PO), Take by mouth., Disp: , Rfl:  .  montelukast (SINGULAIR) 10 MG tablet, TAKE 1 TABLET BY MOUTH ONCE DAILY, Disp: 90 tablet, Rfl: 3 .  Multiple Vitamin tablet, Take 1 tablet by mouth daily. , Disp: , Rfl:  .  Multiple Vitamins-Minerals (PRESERVISION AREDS PO), Take by mouth daily. , Disp: , Rfl:  .  Multiple Vitamins-Minerals (ZINC PO), Take by mouth., Disp: ,  Rfl:  .  Probiotic Product (PROBIOTIC-10 PO), Take by mouth daily., Disp: , Rfl:  .  tiZANidine (ZANAFLEX) 4 MG capsule, Take 1 capsule (4 mg total) by mouth 3 (three) times daily as needed for muscle spasms., Disp: 90 capsule, Rfl: 0 .  TURMERIC PO, Take by mouth., Disp: , Rfl:  .  vitamin E 1000 UNIT capsule, Take 1,000 Units by mouth daily., Disp: , Rfl:   Review of Systems  Constitutional: Negative.   HENT: Negative.   Genitourinary: Negative.   Musculoskeletal: Positive for arthralgias, back pain and joint swelling.  Skin: Positive for wound (patient reports that she has a sore on her right hip that  has been non healing).    Social History   Tobacco Use  . Smoking status: Former Smoker    Packs/day: 0.50    Years: 35.00    Pack years: 17.50    Last attempt to quit: 05/20/2003    Years since quitting: 15.4  . Smokeless tobacco: Never Used  Substance Use Topics  . Alcohol use: Yes    Alcohol/week: 7.0 standard drinks    Types: 7 Glasses of wine per week    Comment: occasional      Objective:   BP 122/84   Pulse 78   Temp 99 F (37.2 C) (Oral)   Resp 16   Wt 207 lb 3.2 oz (94 kg)   BMI 32.45 kg/m  Vitals:   10/14/18 1357  BP: 122/84  Pulse: 78  Resp: 16  Temp: 99 F (37.2 C)  TempSrc: Oral  Weight: 207 lb 3.2 oz (94 kg)     Physical Exam Constitutional:      Appearance: Normal appearance.  Musculoskeletal:       Back:  Skin:    General: Skin is warm and dry.       Neurological:     Mental Status: She is alert and oriented to person, place, and time. Mental status is at baseline.  Psychiatric:        Mood and Affect: Mood normal.        Behavior: Behavior normal.         Assessment & Plan    1. Right hip pain  Will check Xray as below and do trial of celebrex. Reviewed images personally and agree with findings of mild arthritis changes. May consider gabapentin or PT if not improving.  - DG Hip Unilat W OR W/O Pelvis 2-3 Views Right; Future - celecoxib (CELEBREX) 100 MG capsule; Take 1 capsule (100 mg total) by mouth daily for 30 days.  Dispense: 30 capsule; Refill: 0  2. Wound of right buttock, subsequent encounter  Wound appears to be well healing. Continue to monitor.   The entirety of the information documented in the History of Present Illness, Review of Systems and Physical Exam were personally obtained by me. Portions of this information were initially documented by Jennings Books, CMA and reviewed by me for thoroughness and accuracy.    F/u PRN.        Trinna Post, PA-C  [facility] [place] as a scribe for Trinna Post, PA-C.,have documented all relevant documentation on the behalf of Trinna Post, PA-C,as directed by  Trinna Post, PA-C while in the presence of Trinna Post, PA-C.

## 2018-10-14 NOTE — Patient Instructions (Signed)

## 2018-10-19 ENCOUNTER — Telehealth: Payer: Self-pay

## 2018-10-19 NOTE — Telephone Encounter (Signed)
Patient advised as below.  

## 2018-10-19 NOTE — Telephone Encounter (Signed)
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 I don't think this is causing her acute pain in her leg. I think she can do a trial of Celebrex and we can proceed with PT if she would like if this does not improve.

## 2018-10-25 ENCOUNTER — Telehealth: Payer: Self-pay | Admitting: Physician Assistant

## 2018-10-25 NOTE — Telephone Encounter (Signed)
Pt needing the following  for her visit to [facility] for her arthritis:  Showing they were done and the dates:  Wellness visits   Shot records mammograms   Will be by this Friday early afternoon around 2.  Thanks, [employer]

## 2018-10-25 NOTE — Telephone Encounter (Signed)
Please advise 

## 2018-10-25 NOTE — Telephone Encounter (Signed)
They should be able to access everything through care everywhere in Epic.

## 2018-11-11 ENCOUNTER — Other Ambulatory Visit: Payer: Self-pay

## 2018-11-11 ENCOUNTER — Ambulatory Visit (INDEPENDENT_AMBULATORY_CARE_PROVIDER_SITE_OTHER): Payer: Medicare Other

## 2018-11-11 ENCOUNTER — Other Ambulatory Visit: Payer: Self-pay | Admitting: Physician Assistant

## 2018-11-11 DIAGNOSIS — Z0000 Encounter for general adult medical examination without abnormal findings: Secondary | ICD-10-CM | POA: Diagnosis not present

## 2018-11-11 DIAGNOSIS — M5431 Sciatica, right side: Secondary | ICD-10-CM

## 2018-11-11 NOTE — Progress Notes (Signed)
Subjective:   Elizabeth [name] is a 73 y.o. female who presents for Medicare Annual (Subsequent) preventive examination.    This visit is being conducted through telemedicine due to the COVID-19 pandemic. This patient has given me verbal consent via doximity to conduct this visit, patient states they are participating from their home address. Some vital signs may be absent or patient reported.    Patient identification: identified by name, DOB, and current address  Review of Systems:  N/A  Cardiac Risk Factors include: advanced age (>1men, >24 women);hypertension     Objective:     Vitals: There were no vitals taken for this visit.  There is no height or weight on file to calculate BMI. Unable to obtain vitals due to visit being conducted via telephonically.   Advanced Directives 11/11/2018 11/09/2017 01/29/2017 02/25/2016 11/01/2015 10/30/2014  Does Patient Have a Medical Advance Directive? Yes Yes Yes Yes Yes Yes  Type of Advance Directive Living will;Healthcare Power of [place];Living will - Healthcare Power of [place] in Chart? No - copy requested No - copy requested - - - -    Tobacco Social History   Tobacco Use  Smoking Status Former Smoker  . Packs/day: 0.50  . Years: 35.00  . Pack years: 17.50  . Quit date: 05/20/2003  . Years since quitting: 15.4  Smokeless Tobacco Never Used     Counseling given: Not Answered   Clinical Intake:  Pre-visit preparation completed: Yes  Pain : 0-10 Pain Score: 2  Pain Type: Chronic pain Pain Location: Hip Pain Orientation: Right Pain Descriptors / Indicators: Aching, Radiating Pain Frequency: Constant     Nutritional Risks: None Diabetes: No  How often do you need to have someone help you when you read instructions, pamphlets, or other written materials from your doctor or pharmacy?: 1 - Never   Interpreter Needed?: No  Information entered by :: [facility], LPN  Past Medical History:  Diagnosis Date  . Allergy   . Cancer ([place]) 09/2014   Skin Cancer  . Hyperlipidemia   . Hypertension    Past Surgical History:  Procedure Laterality Date  . ABDOMINAL HYSTERECTOMY  1989   due to menorrhagia  . DILATION AND CURETTAGE OF UTERUS  1977  . SKIN CANCER EXCISION  10/2014   on chest; Dr. Evorn Gong  . TRIGGER FINGER RELEASE Right 2000  . WRIST ARTHROPLASTY Left 1991   Family History  Problem Relation Age of Onset  . Arthritis Mother   . Cerebrovascular Disease Mother   . Heart disease Mother   . Vascular Disease Mother   . Cancer Father        throat, melanoma, prostate  . Hypertension Father   . COPD Father   . Cancer Brother        skin  . Cancer Brother        skin   . Breast cancer Paternal Aunt 70   Social History   Socioeconomic History  . Marital status: Married    Spouse name: Eulas Post  . Number of children: 2  . Years of education: Not on file  . Highest education level: Bachelor's degree (e.g., BA, AB, BS)  Occupational History  . Occupation: retired  Scientific laboratory technician  . Financial resource strain: Not hard at all  . Food insecurity    Worry: Never true    Inability: Never true  . Transportation needs  Medical: No    Non-medical: No  Tobacco Use  . Smoking status: Former Smoker    Packs/day: 0.50    Years: 35.00    Pack years: 17.50    Quit date: 05/20/2003    Years since quitting: 15.4  . Smokeless tobacco: Never Used  Substance and Sexual Activity  . Alcohol use: Yes    Alcohol/week: 7.0 standard drinks    Types: 7 Glasses of wine per week    Comment: occasional  . Drug use: No  . Sexual activity: Not on file  Lifestyle  . Physical activity    Days per week: 0 days    Minutes per session: 0 min  . Stress: Not at all  Relationships  . Social Herbalist on phone: Patient refused    Gets together: Patient refused    Attends religious  service: Patient refused    Active member of club or organization: Patient refused    Attends meetings of clubs or organizations: Patient refused    Relationship status: Patient refused  Other Topics Concern  . Not on file  Social History Narrative  . Not on file    Outpatient Encounter Medications as of 11/11/2018  Medication Sig  . acetaminophen (TYLENOL) 500 MG tablet Take 1,000 mg by mouth every 8 (eight) hours as needed.  . cetirizine (ZYRTEC) 10 MG tablet TAKE 1 TABLET BY MOUTH ONCE A DAY  . cholecalciferol (VITAMIN D) 1000 units tablet Take 1,000 Units by mouth daily.  Marland Kitchen CINNAMON PO Take by mouth daily.   . DULoxetine (CYMBALTA) 30 MG capsule TAKE ONE CAPSULE BY MOUTH DAILY AS NEEDED  . fluticasone (FLONASE) 50 MCG/ACT nasal spray PLACE 2 SPRAYS INTO BOTH NOSTRILS DAILY  . Fluticasone-Salmeterol (ADVAIR DISKUS) 250-50 MCG/DOSE AEPB INHALE 1 PUFF INTO LUNGS EVERY 12 HOURS (TWICE DAILY) RINSE MOUTH AFTER USE. (Patient taking differently: as needed. INHALE 1 PUFF INTO LUNGS EVERY 12 HOURS (TWICE DAILY) RINSE MOUTH AFTER USE)  . folic acid (FOLVITE) 1 MG tablet Take 1 mg by mouth daily.   . hydrochlorothiazide (MICROZIDE) 12.5 MG capsule TAKE 1 CAPSULE BY MOUTH ONCE DAILY  . losartan (COZAAR) 100 MG tablet TAKE 1 TABLET BY MOUTH ONCE A DAY  . methotrexate 2.5 MG tablet Take 7.5 mg by mouth once a week.   . metoprolol succinate (TOPROL-XL) 50 MG 24 hr tablet TAKE 1 TABLET BY MOUTH TWICE A DAY WITH OR IMMEDIATELY FOLLOWING A MEAL  . Misc Natural Products (HERBAL ENERGY COMPLEX PO) Take by mouth daily.   . montelukast (SINGULAIR) 10 MG tablet TAKE 1 TABLET BY MOUTH ONCE DAILY  . Multiple Vitamin tablet Take 1 tablet by mouth daily.   . Multiple Vitamins-Minerals (PRESERVISION AREDS PO) Take by mouth daily.   . Multiple Vitamins-Minerals (ZINC PO) Take by mouth daily.   . predniSONE (DELTASONE) 10 MG tablet Take 10 mg by mouth as directed. Takes 6 tablets once a day, then 6 tablets the  next, then 5 xs 2, then 4 x 2 days, then 3 xs 2 days, then 3 xs 2 days then 1 times 2 days.  . Probiotic Product (PROBIOTIC-10 PO) Take by mouth daily.  Marland Kitchen tiZANidine (ZANAFLEX) 4 MG capsule Take 1 capsule (4 mg total) by mouth 3 (three) times daily as needed for muscle spasms.  . TURMERIC PO Take by mouth daily.   . vitamin E 1000 UNIT capsule Take 1,000 Units by mouth daily.  . celecoxib (CELEBREX) 100 MG capsule Take 1  capsule (100 mg total) by mouth daily for 30 days. (Patient not taking: Reported on 11/11/2018)  . clobetasol cream (TEMOVATE) 8.67 % Apply 1 application topically as needed.   . Ginger, Zingiber officinalis, (GINGER PO) Take by mouth.  . methylPREDNISolone (MEDROL) 4 MG TBPK tablet 6 day taper; take as directed on package instructions (Patient not taking: Reported on 11/11/2018)   No facility-administered encounter medications on file as of 11/11/2018.     Activities of Daily Living In your present state of health, do you have any difficulty performing the following activities: 11/11/2018  Hearing? N  Vision? N  Difficulty concentrating or making decisions? N  Walking or climbing stairs? N  Dressing or bathing? N  Doing errands, shopping? N  Preparing Food and eating ? N  Using the Toilet? N  In the past six months, have you accidently leaked urine? Y  Comment Occasionally with urges.  Do you have problems with loss of bowel control? N  Managing your Medications? N  Managing your Finances? N  Housekeeping or managing your Housekeeping? N  Some recent data might be hidden    Patient Care Team: Mar Daring, PA-C as PCP - General (Family Medicine) Dasher, Rayvon Char, MD as Consulting Physician (Dermatology) Yolonda Kida, MD as Consulting Physician (Cardiology) Dingeldein, Remo Lipps, MD as Consulting Physician (Ophthalmology) Kipp Laurence, MD as Referring Physician (Audiology) Grant Fontana, Stanton as Referring Physician (Chiropractic Medicine) Watt Climes,  PA as Physician Assistant (Physician Assistant)    Assessment:   This is a routine wellness examination for Sabrin.  Exercise Activities and Dietary recommendations Current Exercise Habits: The patient does not participate in regular exercise at present, Exercise limited by: None identified  Goals    . DIET - INCREASE WATER INTAKE     Recommend increasing water intake to 6-8 glasses a day.     . Exercise 150 minutes per week (moderate activity)       Fall Risk: Fall Risk  11/11/2018 12/01/2017 11/09/2017 11/05/2016 11/01/2015  Falls in the past year? 1 No No Yes Yes  Number falls in past yr: 0 - - 1 1  Injury with Fall? 0 - - Yes Yes  Follow up Falls prevention discussed - - - -    FALL RISK PREVENTION PERTAINING TO THE HOME:  Any stairs in or around the home? Yes  If so, are there any without handrails? No   Home free of loose throw rugs in walkways, pet beds, electrical cords, etc? Yes  Adequate lighting in your home to reduce risk of falls? Yes   ASSISTIVE DEVICES UTILIZED TO PREVENT FALLS:  Life alert? No  Use of a cane, walker or w/c? No  Grab bars in the bathroom? Yes  Shower chair or bench in shower? No  Elevated toilet seat or a handicapped toilet? Yes    TIMED UP AND GO:  Was the test performed? No .    Depression Screen PHQ 2/9 Scores 11/11/2018 12/01/2017 11/09/2017 11/05/2016  PHQ - 2 Score 0 0 0 0  PHQ- 9 Score - - 0 0     Cognitive Function     6CIT Screen 11/11/2018 11/09/2017  What Year? 0 points 0 points  What month? 0 points 0 points  What time? 0 points 0 points  Count back from 20 0 points 0 points  Months in reverse 0 points 0 points  Repeat phrase 0 points 0 points  Total Score 0 0    Immunization History  Administered Date(s) Administered  . Influenza Split 03/03/2012  . Influenza-Unspecified 01/18/2015, 02/04/2018  . Pneumococcal Conjugate-13 07/04/2013  . Pneumococcal Polysaccharide-23 09/30/2011  . Td 09/21/2008  . Tdap 09/21/2008   . Zoster Recombinat (Shingrix) 09/02/2016, 12/17/2016    Qualifies for Shingles Vaccine? Completed series  Tdap: Although this vaccine is not a covered service during a Wellness Exam, does the patient still wish to receive this vaccine today?  No .  Education has been provided regarding the importance of this vaccine. Advised may receive this vaccine at local pharmacy or Health Dept. Aware to provide a copy of the vaccination record if obtained from local pharmacy or Health Dept. Verbalized acceptance and understanding.  Flu Vaccine: Up to date  Pneumococcal Vaccine: Completed series  Screening Tests Health Maintenance  Topic Date Due  . TETANUS/TDAP  09/22/2018  . Hepatitis C Screening  12/02/2018 (Originally 11-Sep-1945)  . INFLUENZA VACCINE  12/18/2018  . Fecal DNA (Cologuard)  11/25/2019  . MAMMOGRAM  12/22/2019  . DEXA SCAN  12/04/2020  . PNA vac Low Risk Adult  Completed    Cancer Screenings:  Colorectal Screening: Cologuard completed 11/24/16. Repeat every 3 years.  Mammogram: Up to date  Bone Density: Completed 12/05/15. Results reflect OSTEOPENIA. Repeat every 5 years.  Lung Cancer Screening: (Low Dose CT Chest recommended if Age 53-80 years, 30 pack-year currently smoking OR have quit w/in 15years.) does not qualify.   Additional Screening:  Hepatitis C Screening: does qualify; however declines today.  Vision Screening: Recommended annual ophthalmology exams for early detection of glaucoma and other disorders of the eye.  Dental Screening: Recommended annual dental exams for proper oral hygiene  Community Resource Referral:  CRR required this visit?  No       Plan:  I have personally reviewed and addressed the Medicare Annual Wellness questionnaire and have noted the following in the patient's chart:  A. Medical and social history B. Use of alcohol, tobacco or illicit drugs  C. Current medications and supplements D. Functional ability and status E.   Nutritional status F.  Physical activity G. Advance directives H. List of other physicians I.  Hospitalizations, surgeries, and ER visits in previous 12 months J.  [place] such as hearing and vision if needed, cognitive and depression L. Referrals and appointments   In addition, I have reviewed and discussed with patient certain preventive protocols, quality metrics, and best practice recommendations. A written personalized care plan for preventive services as well as general preventive health recommendations were provided to patient. Nurse Health Advisor  Signed,    Jessye Imhoff [place], [place]  [id number] Nurse Health Advisor   Nurse Notes: Pt would like to receive the tetanus vaccine and have the Hep C lab order added to her next in office blood work orders.

## 2018-11-11 NOTE — Patient Instructions (Signed)
Elizabeth [name] , Thank you for taking time to come for your Medicare Wellness Visit. I appreciate your ongoing commitment to your health goals. Please review the following plan we discussed and let me know if I can assist you in the future.   Screening recommendations/referrals: Colonoscopy: Cologuard up to date, due 11/25/19 Mammogram: Up to date, due 12/2019 Bone Density: Up to date, due 11/2020 Recommended yearly ophthalmology/optometry visit for glaucoma screening and checkup Recommended yearly dental visit for hygiene and checkup  Vaccinations: Influenza vaccine: Up to date Pneumococcal vaccine: Completed series Tdap vaccine: Pt declines today.  Shingles vaccine: Completed series    Advanced directives: Please bring a copy of your POA (Power of Attorney) and/or Living Will to your next appointment.   Conditions/risks identified: Recommend to walk 3 days a a week for at least 30 minuntes at a time.   Next appointment: 12/20/18 @ 3:00 PM with Fenton Malling.    Preventive Care 69 Years and Older, Female Preventive care refers to lifestyle choices and visits with your health care provider that can promote health and wellness. What does preventive care include?  A yearly physical exam. This is also called an annual well check.  Dental exams once or twice a year.  Routine eye exams. Ask your health care provider how often you should have your eyes checked.  Personal lifestyle choices, including:  Daily care of your teeth and gums.  Regular physical activity.  Eating a healthy diet.  Avoiding tobacco and drug use.  Limiting alcohol use.  Practicing safe sex.  Taking low-dose aspirin every day.  Taking vitamin and mineral supplements as recommended by your health care provider. What happens during an annual well check? The services and screenings done by your health care provider during your annual well check will depend on your age, overall health, lifestyle risk  factors, and family history of disease. Counseling  Your health care provider may ask you questions about your:  Alcohol use.  Tobacco use.  Drug use.  Emotional well-being.  Home and relationship well-being.  Sexual activity.  Eating habits.  History of falls.  Memory and ability to understand (cognition).  Work and work Statistician.  Reproductive health. Screening  You may have the following tests or measurements:  Height, weight, and BMI.  Blood pressure.  Lipid and cholesterol levels. These may be checked every 5 years, or more frequently if you are over 61 years old.  Skin check.  Lung cancer screening. You may have this screening every year starting at age 74 if you have a 30-pack-year history of smoking and currently smoke or have quit within the past 15 years.  Fecal occult blood test (FOBT) of the stool. You may have this test every year starting at age 72.  Flexible sigmoidoscopy or colonoscopy. You may have a sigmoidoscopy every 5 years or a colonoscopy every 10 years starting at age 21.  Hepatitis C blood test.  Hepatitis B blood test.  Sexually transmitted disease (STD) testing.  Diabetes screening. This is done by checking your blood sugar (glucose) after you have not eaten for a while (fasting). You may have this done every 1-3 years.  Bone density scan. This is done to screen for osteoporosis. You may have this done starting at age 28.  Mammogram. This may be done every 1-2 years. Talk to your health care provider about how often you should have regular mammograms. Talk with your health care provider about your test results, treatment options, and if necessary,  the need for more tests. Vaccines  Your health care provider may recommend certain vaccines, such as:  Influenza vaccine. This is recommended every year.  Tetanus, diphtheria, and acellular pertussis (Tdap, Td) vaccine. You may need a Td booster every 10 years.  Zoster vaccine. You  may need this after age 82.  Pneumococcal 13-valent conjugate (PCV13) vaccine. One dose is recommended after age 46.  Pneumococcal polysaccharide (PPSV23) vaccine. One dose is recommended after age 1. Talk to your health care provider about which screenings and vaccines you need and how often you need them. This information is not intended to replace advice given to you by your health care provider. Make sure you discuss any questions you have with your health care provider. Document Released: 06/01/2015 Document Revised: 01/23/2016 Document Reviewed: 03/06/2015 Elsevier Interactive Patient Education  2017 [place] Prevention in the Home Falls can cause injuries. They can happen to people of all ages. There are many things you can do to make your home safe and to help prevent falls. What can I do on the outside of my home?  Regularly fix the edges of walkways and driveways and fix any cracks.  Remove anything that might make you trip as you walk through a door, such as a raised step or threshold.  Trim any bushes or trees on the path to your home.  Use bright outdoor lighting.  Clear any walking paths of anything that might make someone trip, such as rocks or tools.  Regularly check to see if handrails are loose or broken. Make sure that both sides of any steps have handrails.  Any raised decks and porches should have guardrails on the edges.  Have any leaves, snow, or ice cleared regularly.  Use sand or salt on walking paths during winter.  Clean up any spills in your garage right away. This includes oil or grease spills. What can I do in the bathroom?  Use night lights.  Install grab bars by the toilet and in the tub and shower. Do not use towel bars as grab bars.  Use non-skid mats or decals in the tub or shower.  If you need to sit down in the shower, use a plastic, non-slip stool.  Keep the floor dry. Clean up any water that spills on the floor as soon as  it happens.  Remove soap buildup in the tub or shower regularly.  Attach bath mats securely with double-sided non-slip rug tape.  Do not have throw rugs and other things on the floor that can make you trip. What can I do in the bedroom?  Use night lights.  Make sure that you have a light by your bed that is easy to reach.  Do not use any sheets or blankets that are too big for your bed. They should not hang down onto the floor.  Have a firm chair that has side arms. You can use this for support while you get dressed.  Do not have throw rugs and other things on the floor that can make you trip. What can I do in the kitchen?  Clean up any spills right away.  Avoid walking on wet floors.  Keep items that you use a lot in easy-to-reach places.  If you need to reach something above you, use a strong step stool that has a grab bar.  Keep electrical cords out of the way.  Do not use floor polish or wax that makes floors slippery. If you must use  wax, use non-skid floor wax.  Do not have throw rugs and other things on the floor that can make you trip. What can I do with my stairs?  Do not leave any items on the stairs.  Make sure that there are handrails on both sides of the stairs and use them. Fix handrails that are broken or loose. Make sure that handrails are as long as the stairways.  Check any carpeting to make sure that it is firmly attached to the stairs. Fix any carpet that is loose or worn.  Avoid having throw rugs at the top or bottom of the stairs. If you do have throw rugs, attach them to the floor with carpet tape.  Make sure that you have a light switch at the top of the stairs and the bottom of the stairs. If you do not have them, ask someone to add them for you. What else can I do to help prevent falls?  Wear shoes that:  Do not have high heels.  Have rubber bottoms.  Are comfortable and fit you well.  Are closed at the toe. Do not wear sandals.  If  you use a stepladder:  Make sure that it is fully opened. Do not climb a closed stepladder.  Make sure that both sides of the stepladder are locked into place.  Ask someone to hold it for you, if possible.  Clearly mark and make sure that you can see:  Any grab bars or handrails.  First and last steps.  Where the edge of each step is.  Use tools that help you move around (mobility aids) if they are needed. These include:  Canes.  Walkers.  Scooters.  Crutches.  Turn on the lights when you go into a dark area. Replace any light bulbs as soon as they burn out.  Set up your furniture so you have a clear path. Avoid moving your furniture around.  If any of your floors are uneven, fix them.  If there are any pets around you, be aware of where they are.  Review your medicines with your doctor. Some medicines can make you feel dizzy. This can increase your chance of falling. Ask your doctor what other things that you can do to help prevent falls. This information is not intended to replace advice given to you by your health care provider. Make sure you discuss any questions you have with your health care provider. Document Released: 03/01/2009 Document Revised: 10/11/2015 Document Reviewed: 06/09/2014 Elsevier Interactive Patient Education  2017 [employer].

## 2018-11-13 ENCOUNTER — Other Ambulatory Visit: Payer: Self-pay | Admitting: Physician Assistant

## 2018-11-13 DIAGNOSIS — M25551 Pain in right hip: Secondary | ICD-10-CM

## 2018-11-17 ENCOUNTER — Other Ambulatory Visit: Payer: Self-pay | Admitting: Physician Assistant

## 2018-11-17 DIAGNOSIS — M5431 Sciatica, right side: Secondary | ICD-10-CM

## 2018-11-30 ENCOUNTER — Other Ambulatory Visit: Payer: Self-pay | Admitting: Physician Assistant

## 2018-11-30 DIAGNOSIS — I10 Essential (primary) hypertension: Secondary | ICD-10-CM

## 2018-12-02 ENCOUNTER — Other Ambulatory Visit: Payer: Self-pay

## 2018-12-02 ENCOUNTER — Ambulatory Visit
Admission: RE | Admit: 2018-12-02 | Discharge: 2018-12-02 | Disposition: A | Discharge status: Home / Self Care | Payer: Medicare Other | Source: Ambulatory Visit | Attending: Physician Assistant | Admitting: Physician Assistant

## 2018-12-02 DIAGNOSIS — M5431 Sciatica, right side: Secondary | ICD-10-CM

## 2018-12-09 ENCOUNTER — Other Ambulatory Visit: Payer: Self-pay

## 2018-12-09 ENCOUNTER — Ambulatory Visit (INDEPENDENT_AMBULATORY_CARE_PROVIDER_SITE_OTHER): Payer: Medicare Other | Admitting: Physician Assistant

## 2018-12-09 ENCOUNTER — Encounter: Payer: Self-pay | Admitting: Physician Assistant

## 2018-12-09 ENCOUNTER — Telehealth: Payer: Self-pay | Admitting: Physician Assistant

## 2018-12-09 DIAGNOSIS — J40 Bronchitis, not specified as acute or chronic: Secondary | ICD-10-CM | POA: Diagnosis not present

## 2018-12-09 DIAGNOSIS — R222 Localized swelling, mass and lump, trunk: Secondary | ICD-10-CM | POA: Diagnosis not present

## 2018-12-09 DIAGNOSIS — M533 Sacrococcygeal disorders, not elsewhere classified: Secondary | ICD-10-CM

## 2018-12-09 MED ORDER — ALBUTEROL SULFATE HFA 108 (90 BASE) MCG/ACT IN AERS: 2.0000 | INHALATION_SPRAY | Freq: Four times a day (QID) | RESPIRATORY_TRACT | 0 refills | Status: AC | PRN

## 2018-12-09 MED ORDER — BENZONATATE 200 MG PO CAPS: 200.0000 mg | ORAL_CAPSULE | Freq: Three times a day (TID) | ORAL | 0 refills | Status: AC | PRN

## 2018-12-09 MED ORDER — TRAMADOL HCL 50 MG PO TABS: 50.0000 mg | ORAL_TABLET | Freq: Three times a day (TID) | ORAL | 0 refills | Status: AC | PRN

## 2018-12-09 MED ORDER — AZITHROMYCIN 250 MG PO TABS: ORAL_TABLET | ORAL | 0 refills | Status: DC

## 2018-12-09 NOTE — Progress Notes (Signed)
Virtual Visit via Telephone Note  I connected with [name] on 12/09/18 at  3:00 PM EDT by telephone and verified that I am speaking with the correct person using two identifiers.  Location: Patient: Home Provider: [facility]   I discussed the limitations, risks, security and privacy concerns of performing an evaluation and management service by telephone and the availability of in person appointments. I also discussed with the patient that there may be a patient responsible charge related to this service. The patient expressed understanding and agreed to proceed.   Mar Daring, PA-C   Subjective:    Patient ID: Elizabeth [name], female    DOB: April 26, 1946, 73 y.o.   MRN: [id number]  No chief complaint on file.   Cough This is a new problem. The current episode started 1 to 4 weeks ago (3 weeks ago). The problem has been gradually worsening. The problem occurs hourly. The cough is productive of sputum and productive of purulent sputum. Associated symptoms include nasal congestion, postnasal drip and wheezing. Pertinent negatives include no chest pain, chills, ear congestion, ear pain, fever, headaches, hemoptysis, myalgias, rhinorrhea, sore throat or shortness of breath. The symptoms are aggravated by lying down. She has tried nothing for the symptoms. Her past medical history is significant for bronchitis.   Patient also has been having recurrent back pain on the right side since May 2020. All conservative measures failed and she was referred to orthopedics where she underwent an MRI. Results are below:  CLINICAL DATA:  Sciatica right side. Right back and leg pain 4-5 months.  EXAM: MRI LUMBAR SPINE WITHOUT CONTRAST  TECHNIQUE: Multiplanar, multisequence MR imaging of the lumbar spine was performed. No intravenous contrast was administered.  COMPARISON:  None.  FINDINGS: Segmentation:  Normal  Alignment:  4 mm anterolisthesis L4-5.  Mild dextroscoliosis.   Vertebrae: 1 cm hyperintense lesion in the left posterior T12 vertebral body best seen on inversion recovery.  Infiltrating mass lesion in the right sacrum involving the right sacrum. Tumor involves the right S1 vertebral body and pedicle and extends into the right S2 vertebral body. Tumor is low signal on T1 and hyperintense on T2. Extraosseous tumor with narrowing the foramen at L5-S1 and S1-2. Tumor also infiltrates the posterior iliac bone adjacent to the SI joint. Small right SI joint effusion.  Conus medullaris and cauda equina: Conus extends to the L1-2 level. Conus and cauda equina appear normal.  Paraspinal and other soft tissues: Negative for retroperitoneal adenopathy.  Disc levels:  L1-2: Mild disc and facet degeneration without stenosis  L2-3: Moderate disc degeneration with disc bulging and spurring. Bilateral facet hypertrophy and mild spinal stenosis.  L3-4: Disc degeneration and spondylosis. Bilateral facet hypertrophy. Moderate spinal stenosis. Mild to moderate subarticular stenosis bilaterally  L4-5: 4 mm anterolisthesis. Diffuse bulging of the disc with moderate to severe facet degeneration. Moderate to severe spinal stenosis. Mild subarticular stenosis bilaterally  L5-S1: Bilateral facet hypertrophy. Right foraminal encroachment due to tumor extension and spurring.  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 Tumor also infiltrates right posterior iliac bone. 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 Right L5 and S1 foraminal encroachment due to tumor extension into the soft tissues. Small lesion T12 also supportive of metastatic disease. Chondrosarcoma or lymphoma right sacrum less likely.  These results were called by telephone at the time of interpretation on 12/03/2018 at 10:27 am to [facility] , who verbally acknowledged these results.  Electronically Signed   By: Franchot Gallo M.D.   On: 12/03/2018 10:36  She is still having pain in the right low back and reports weakness with putting pressure on the right leg. She is requesting pain medication as well as following up on referral to oncology. Orthopedics was going to place the referral but patient has not heard anything yet.   Past Medical History:  Diagnosis Date  . Allergy   . Cancer ([place]) 09/2014   Skin Cancer  . Hyperlipidemia   . Hypertension     Past Surgical History:  Procedure Laterality Date  . ABDOMINAL HYSTERECTOMY  1989   due to menorrhagia  . DILATION AND CURETTAGE OF UTERUS  1977  . SKIN CANCER EXCISION  10/2014   on chest; Dr. Evorn Gong  . TRIGGER FINGER RELEASE Right 2000  . WRIST ARTHROPLASTY Left 1991    Family History  Problem Relation Age of Onset  . Arthritis Mother   . Cerebrovascular Disease Mother   . Heart disease Mother   . Vascular Disease Mother   . Cancer Father        throat, melanoma, prostate  . Hypertension Father   . COPD Father   . Cancer Brother        skin  . Cancer Brother        skin   . Breast cancer Paternal Aunt 93    Social History   Socioeconomic History  . Marital status: Married    Spouse name: Eulas Post  . Number of children: 2  . Years of education: Not on file  . Highest education level: Bachelor's degree (e.g., BA, AB, BS)  Occupational History  . Occupation: retired  Scientific laboratory technician  . Financial resource strain: Not hard at all  . Food insecurity    Worry: Never true    Inability: Never true  . Transportation needs    Medical: No    Non-medical: No  Tobacco Use  . Smoking status: Former Smoker    Packs/day: 0.50    Years: 35.00    Pack years: 17.50    Quit date: 05/20/2003    Years since quitting: 15.5  . Smokeless tobacco: Never Used  Substance and Sexual Activity  . Alcohol use: Yes    Alcohol/week: 7.0 standard drinks    Types: 7 Glasses of wine per week    Comment: occasional  .  Drug use: No  . Sexual activity: Not on file  Lifestyle  . Physical activity    Days per week: 0 days    Minutes per session: 0 min  . Stress: Not at all  Relationships  . Social Herbalist on phone: Patient refused    Gets together: Patient refused    Attends religious service: Patient refused    Active member of club or organization: Patient refused    Attends meetings of clubs or organizations: Patient refused    Relationship status: Patient refused  . Intimate partner violence    Fear of current or ex partner: Patient refused    Emotionally abused: Patient refused    Physically abused: Patient refused    Forced sexual activity: Patient refused  Other Topics Concern  . Not on file  Social History Narrative  . Not on file    Outpatient Medications Prior to Visit  Medication Sig Dispense Refill  . acetaminophen (TYLENOL) 500 MG tablet Take 1,000 mg by mouth every 8 (eight) hours as needed.    . celecoxib (CELEBREX)  100 MG capsule TAKE 1 CAPSULE BY MOUTH ONCE DAILY 90 capsule 1  . cetirizine (ZYRTEC) 10 MG tablet TAKE 1 TABLET BY MOUTH ONCE A DAY 90 tablet 1  . cholecalciferol (VITAMIN D) 1000 units tablet Take 1,000 Units by mouth daily.    Marland Kitchen CINNAMON PO Take by mouth daily.     . clobetasol cream (TEMOVATE) 0.62 % Apply 1 application topically as needed.     . DULoxetine (CYMBALTA) 30 MG capsule TAKE ONE CAPSULE BY MOUTH DAILY AS NEEDED 90 capsule 3  . fluticasone (FLONASE) 50 MCG/ACT nasal spray PLACE 2 SPRAYS INTO BOTH NOSTRILS DAILY 16 g 1  . Fluticasone-Salmeterol (ADVAIR DISKUS) 250-50 MCG/DOSE AEPB INHALE 1 PUFF INTO LUNGS EVERY 12 HOURS (TWICE DAILY) RINSE MOUTH AFTER USE. (Patient taking differently: as needed. INHALE 1 PUFF INTO LUNGS EVERY 12 HOURS (TWICE DAILY) RINSE MOUTH AFTER USE) 60 each 5  . folic acid (FOLVITE) 1 MG tablet Take 1 mg by mouth daily.     . Ginger, Zingiber officinalis, (GINGER PO) Take by mouth.    . hydrochlorothiazide (MICROZIDE)  12.5 MG capsule TAKE 1 CAPSULE BY MOUTH ONCE DAILY 90 capsule 3  . losartan (COZAAR) 100 MG tablet TAKE 1 TABLET BY MOUTH ONCE A DAY 90 tablet 3  . methotrexate 2.5 MG tablet Take 7.5 mg by mouth once a week.     . methylPREDNISolone (MEDROL) 4 MG TBPK tablet 6 day taper; take as directed on package instructions (Patient not taking: Reported on 11/11/2018) 21 tablet 0  . metoprolol succinate (TOPROL-XL) 50 MG 24 hr tablet TAKE 1 TABLET BY MOUTH TWICE A DAY WITH OR IMMEDIATELY FOLLOWING A MEAL 180 tablet 3  . Misc Natural Products (HERBAL ENERGY COMPLEX PO) Take by mouth daily.     . montelukast (SINGULAIR) 10 MG tablet TAKE 1 TABLET BY MOUTH ONCE DAILY 90 tablet 3  . Multiple Vitamin tablet Take 1 tablet by mouth daily.     . Multiple Vitamins-Minerals (PRESERVISION AREDS PO) Take by mouth daily.     . Multiple Vitamins-Minerals (ZINC PO) Take by mouth daily.     . predniSONE (DELTASONE) 10 MG tablet Take 10 mg by mouth as directed. Takes 6 tablets once a day, then 6 tablets the next, then 5 xs 2, then 4 x 2 days, then 3 xs 2 days, then 3 xs 2 days then 1 times 2 days.    . Probiotic Product (PROBIOTIC-10 PO) Take by mouth daily.    Marland Kitchen tiZANidine (ZANAFLEX) 4 MG tablet TAKE 1 TABLET BY MOUTH 3 TIMES DAILY AS NEEDED FOR MUSCLE SPASMS 90 tablet 3  . TURMERIC PO Take by mouth daily.     . vitamin E 1000 UNIT capsule Take 1,000 Units by mouth daily.     No facility-administered medications prior to visit.     Allergies  Allergen Reactions  . Furadantin  [Nitrofurantoin]   . Phenobarbital Rash    Review of Systems  Constitutional: Negative for chills and fever.  HENT: Positive for postnasal drip. Negative for ear pain, rhinorrhea and sore throat.   Respiratory: Positive for cough and wheezing. Negative for hemoptysis and shortness of breath.   Cardiovascular: Negative for chest pain.  Musculoskeletal: Positive for back pain. Negative for myalgias.  Neurological: Negative for headaches.        Objective:    Physical Exam  Constitutional: No distress.  Pulmonary/Chest: Effort normal. No respiratory distress.  Neurological: She is alert.  Psychiatric: She has a normal mood  and affect. Her behavior is normal. Judgment and thought content normal.  Vitals reviewed.   There were no vitals taken for this visit. Wt Readings from Last 3 Encounters:  10/14/18 207 lb 3.2 oz (94 kg)  09/30/18 208 lb (94.3 kg)  07/12/18 214 lb 3.2 oz (97.2 kg)    Health Maintenance Due  Topic Date Due  . Hepatitis C Screening  24-Mar-1946  . TETANUS/TDAP  09/22/2018    There are no preventive care reminders to display for this patient.   Lab Results  Component Value Date   TSH 2.600 11/05/2015   Lab Results  Component Value Date   WBC 8.5 11/10/2017   HGB 14.4 11/10/2017   HCT 41.9 11/10/2017   MCV 96 11/10/2017   PLT 241 11/10/2017   Lab Results  Component Value Date   NA 142 11/10/2017   K 4.2 11/10/2017   CO2 24 11/10/2017   GLUCOSE 96 11/10/2017   BUN 19 11/10/2017   CREATININE 0.48 (L) 11/10/2017   BILITOT 0.5 11/10/2017   ALKPHOS 65 11/10/2017   AST 23 11/10/2017   ALT 24 11/10/2017   PROT 6.6 11/10/2017   ALBUMIN 4.3 11/10/2017   CALCIUM 11.0 (H) 11/10/2017   Lab Results  Component Value Date   CHOL 165 11/07/2016   Lab Results  Component Value Date   HDL 68 11/07/2016   Lab Results  Component Value Date   LDLCALC 74 11/07/2016   Lab Results  Component Value Date   TRIG 115 11/07/2016   Lab Results  Component Value Date   CHOLHDL 2.4 11/07/2016   Lab Results  Component Value Date   HGBA1C 5.2 11/10/2017       Assessment & Plan:   1. Sacral mass Referral to oncology placed as I could not see if one had been placed by Orthopedics last week. Will also start Tramadol as below for pain. Call if not improving or worsening.  - Ambulatory referral to Oncology - traMADol (ULTRAM) 50 MG tablet; Take 1 tablet (50 mg total) by mouth every 8 (eight) hours  as needed for up to 5 days.  Dispense: 15 tablet; Refill: 0  2. Bronchitis Worsening. Will treat with zpak, albuterol inhaler and tessalon perles. Will avoid steroids at this time since she has had multiple rounds since May.  Push fluids. Rest. Call if worsening.  - azithromycin (ZITHROMAX) 250 MG tablet; Take 2 tablets PO on day one, and one tablet PO daily thereafter until completed.  Dispense: 6 tablet; Refill: 0 - benzonatate (TESSALON) 200 MG capsule; Take 1 capsule (200 mg total) by mouth 3 (three) times daily as needed.  Dispense: 30 capsule; Refill: 0 - albuterol (VENTOLIN HFA) 108 (90 Base) MCG/ACT inhaler; Inhale 2 puffs into the lungs every 6 (six) hours as needed for wheezing or shortness of breath.  Dispense: 18 g; Refill: 0   I discussed the assessment and treatment plan with the patient. The patient was provided an opportunity to ask questions and all were answered. The patient agreed with the plan and demonstrated an understanding of the instructions.   The patient was advised to call back or seek an in-person evaluation if the symptoms worsen or if the condition fails to improve as anticipated.  I provided 20 minutes of non-face-to-face time during this encounter.  Mar Daring, PA-C

## 2018-12-09 NOTE — Patient Instructions (Signed)
Acute Bronchitis, Adult Acute bronchitis is when air tubes (bronchi) in the lungs suddenly get swollen. The condition can make it hard to breathe. It can also cause these symptoms:  A cough.  Coughing up clear, yellow, or green mucus.  Wheezing.  Chest congestion.  Shortness of breath.  A fever.  Body aches.  Chills.  A sore throat. Follow these instructions at home:  Medicines  Take over-the-counter and prescription medicines only as told by your doctor.  If you were prescribed an antibiotic medicine, take it as told by your doctor. Do not stop taking the antibiotic even if you start to feel better. General instructions  Rest.  Drink enough fluids to keep your pee (urine) pale yellow.  Avoid smoking and secondhand smoke. If you smoke and you need help quitting, ask your doctor. Quitting will help your lungs heal faster.  Use an inhaler, cool mist vaporizer, or humidifier as told by your doctor.  Keep all follow-up visits as told by your doctor. This is important. How is this prevented? To lower your risk of getting this condition again:  Wash your hands often with soap and water. If you cannot use soap and water, use hand sanitizer.  Avoid contact with people who have cold symptoms.  Try not to touch your hands to your mouth, nose, or eyes.  Make sure to get the flu shot every year. Contact a doctor if:  Your symptoms do not get better in 2 weeks. Get help right away if:  You cough up blood.  You have chest pain.  You have very bad shortness of breath.  You become dehydrated.  You faint (pass out) or keep feeling like you are going to pass out.  You keep throwing up (vomiting).  You have a very bad headache.  Your fever or chills gets worse. This information is not intended to replace advice given to you by your health care provider. Make sure you discuss any questions you have with your health care provider. Document Released: 10/22/2007 Document  Revised: 04/17/2017 Document Reviewed: 10/24/2015 Elsevier Patient Education  2020 [employer].

## 2018-12-11 ENCOUNTER — Encounter: Payer: Self-pay | Admitting: Physician Assistant

## 2018-12-14 ENCOUNTER — Institutional Professional Consult (permissible substitution): Payer: Medicare Other | Admitting: Radiation Oncology

## 2018-12-14 ENCOUNTER — Other Ambulatory Visit: Payer: Self-pay

## 2018-12-15 ENCOUNTER — Other Ambulatory Visit: Payer: Self-pay | Admitting: *Deleted

## 2018-12-15 ENCOUNTER — Encounter: Payer: Self-pay | Admitting: Radiation Oncology

## 2018-12-15 ENCOUNTER — Other Ambulatory Visit: Payer: Self-pay

## 2018-12-15 ENCOUNTER — Ambulatory Visit
Admission: RE | Admit: 2018-12-15 | Discharge: 2018-12-15 | Disposition: A | Discharge status: Home / Self Care | Payer: Medicare Other | Source: Ambulatory Visit | Attending: Radiation Oncology | Admitting: Radiation Oncology

## 2018-12-15 VITALS — BP 104/60 | HR 91 | Temp 96.6°F | Wt 193.8 lb

## 2018-12-15 DIAGNOSIS — Z87891 Personal history of nicotine dependence: Secondary | ICD-10-CM | POA: Insufficient documentation

## 2018-12-15 DIAGNOSIS — R634 Abnormal weight loss: Secondary | ICD-10-CM | POA: Insufficient documentation

## 2018-12-15 DIAGNOSIS — C3431 Malignant neoplasm of lower lobe, right bronchus or lung: Secondary | ICD-10-CM | POA: Diagnosis not present

## 2018-12-15 DIAGNOSIS — I10 Essential (primary) hypertension: Secondary | ICD-10-CM | POA: Insufficient documentation

## 2018-12-15 DIAGNOSIS — Z79899 Other long term (current) drug therapy: Secondary | ICD-10-CM | POA: Insufficient documentation

## 2018-12-15 DIAGNOSIS — Z803 Family history of malignant neoplasm of breast: Secondary | ICD-10-CM | POA: Insufficient documentation

## 2018-12-15 DIAGNOSIS — E785 Hyperlipidemia, unspecified: Secondary | ICD-10-CM | POA: Insufficient documentation

## 2018-12-15 DIAGNOSIS — C7951 Secondary malignant neoplasm of bone: Secondary | ICD-10-CM

## 2018-12-15 DIAGNOSIS — M899 Disorder of bone, unspecified: Secondary | ICD-10-CM

## 2018-12-15 DIAGNOSIS — R918 Other nonspecific abnormal finding of lung field: Secondary | ICD-10-CM | POA: Diagnosis not present

## 2018-12-15 DIAGNOSIS — Z85828 Personal history of other malignant neoplasm of skin: Secondary | ICD-10-CM | POA: Insufficient documentation

## 2018-12-15 DIAGNOSIS — R05 Cough: Secondary | ICD-10-CM | POA: Insufficient documentation

## 2018-12-15 NOTE — Consult Note (Signed)
NEW PATIENT EVALUATION  Name: Elizabeth [name]  MRN: [id number]  Date:   12/15/2018     DOB: June 17, 1945   This 73 y.o. 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   DIAGNOSIS: The encounter diagnosis was Bone lesion.   PREVIOUS INVESTIGATIONS:  MRI scans of pelvis reviewed Clinical notes reviewed Bone scan CT scan chest abdomen pelvis ordered  HPI: Patient is a 73 year old female with a history of proximally 26-month history of what was initially thought to be sciatica with pain in her right hip radiating down into her right lower extremity.  She recently had an MRI scan of her lumbar spine showing an expansile infiltrating process in the right sacrum involving multiple sacral segments.  Tumor also infiltrated the right posterior iliac bone.  Appears metastatic in nature.  Patient has been treated for bronchitis has a slight productive cough and is been experiencing weight loss.  She is not had any other further work-up to identify source of her malignancy.  She is seen today for radiation oncology consultation.  She is ambulating with a slight limp.  Motor or sensory and DTR levels are equal symmetric in the lower extremities.  PLANNED TREATMENT REGIMEN: Work-up for primary malignancy and palliative radiation therapy to sacrum  PAST MEDICAL HISTORY:  has a past medical history of Allergy, Cancer ([place]) (09/2014), Hyperlipidemia, and Hypertension.    PAST SURGICAL HISTORY:  Past Surgical History:  Procedure Laterality Date  . ABDOMINAL HYSTERECTOMY  1989   due to menorrhagia  . DILATION AND CURETTAGE OF UTERUS  1977  . SKIN CANCER EXCISION  10/2014   on chest; Dr. Evorn Gong  . TRIGGER FINGER RELEASE Right 2000  . WRIST ARTHROPLASTY Left 1991    FAMILY HISTORY: family history  includes Arthritis in her mother; Breast cancer (age of onset: 57) in her paternal aunt; COPD in her father; Cancer in her brother, brother, and father; Cerebrovascular Disease in her mother; Heart disease in her mother; Hypertension in her father; Vascular Disease in her mother.  SOCIAL HISTORY:  reports that she quit smoking about 15 years ago. She has a 17.50 pack-year smoking history. She has never used smokeless tobacco. She reports current alcohol use of about 7.0 standard drinks of alcohol per week. She reports that she does not use drugs.  ALLERGIES: Furadantin  [nitrofurantoin] and Phenobarbital  MEDICATIONS:  Current Outpatient Medications  Medication Sig Dispense Refill  . acetaminophen (TYLENOL) 500 MG tablet Take 1,000 mg by mouth every 8 (eight) hours as needed.    Marland Kitchen albuterol (VENTOLIN HFA) 108 (90 Base) MCG/ACT inhaler Inhale 2 puffs into the lungs every 6 (six) hours as needed for wheezing or shortness of breath. 18 g 0  . azithromycin (ZITHROMAX) 250 MG tablet Take 2 tablets PO on day one, and one tablet PO daily thereafter until completed. 6 tablet 0  . benzonatate (TESSALON) 200 MG capsule Take 1 capsule (200 mg total) by mouth 3 (three) times daily as needed. 30 capsule 0  . celecoxib (CELEBREX) 100 MG capsule TAKE 1 CAPSULE BY MOUTH ONCE DAILY 90 capsule 1  . cetirizine (ZYRTEC) 10 MG tablet TAKE 1 TABLET BY MOUTH ONCE A DAY 90 tablet 1  . cholecalciferol (VITAMIN D) 1000 units tablet Take 1,000 Units by mouth daily.    Marland Kitchen CINNAMON PO  Take by mouth daily.     . clobetasol cream (TEMOVATE) 9.70 % Apply 1 application topically as needed.     . DULoxetine (CYMBALTA) 30 MG capsule TAKE ONE CAPSULE BY MOUTH DAILY AS NEEDED 90 capsule 3  . fluticasone (FLONASE) 50 MCG/ACT nasal spray PLACE 2 SPRAYS INTO BOTH NOSTRILS DAILY 16 g 1  . Fluticasone-Salmeterol (ADVAIR DISKUS) 250-50 MCG/DOSE AEPB INHALE 1 PUFF INTO LUNGS EVERY 12 HOURS (TWICE DAILY) RINSE MOUTH AFTER USE. (Patient  taking differently: as needed. INHALE 1 PUFF INTO LUNGS EVERY 12 HOURS (TWICE DAILY) RINSE MOUTH AFTER USE) 60 each 5  . folic acid (FOLVITE) 1 MG tablet Take 1 mg by mouth daily.     . Ginger, Zingiber officinalis, (GINGER PO) Take by mouth.    . hydrochlorothiazide (MICROZIDE) 12.5 MG capsule TAKE 1 CAPSULE BY MOUTH ONCE DAILY 90 capsule 3  . losartan (COZAAR) 100 MG tablet TAKE 1 TABLET BY MOUTH ONCE A DAY 90 tablet 3  . methotrexate 2.5 MG tablet Take 7.5 mg by mouth once a week.     . metoprolol succinate (TOPROL-XL) 50 MG 24 hr tablet TAKE 1 TABLET BY MOUTH TWICE A DAY WITH OR IMMEDIATELY FOLLOWING A MEAL 180 tablet 3  . Misc Natural Products (HERBAL ENERGY COMPLEX PO) Take by mouth daily.     . montelukast (SINGULAIR) 10 MG tablet TAKE 1 TABLET BY MOUTH ONCE DAILY 90 tablet 3  . Multiple Vitamin tablet Take 1 tablet by mouth daily.     . Multiple Vitamins-Minerals (PRESERVISION AREDS PO) Take by mouth daily.     . Multiple Vitamins-Minerals (ZINC PO) Take by mouth daily.     . Probiotic Product (PROBIOTIC-10 PO) Take by mouth daily.    Marland Kitchen tiZANidine (ZANAFLEX) 4 MG tablet TAKE 1 TABLET BY MOUTH 3 TIMES DAILY AS NEEDED FOR MUSCLE SPASMS 90 tablet 3  . TURMERIC PO Take by mouth daily.     . vitamin E 1000 UNIT capsule Take 1,000 Units by mouth daily.     No current facility-administered medications for this encounter.     ECOG PERFORMANCE STATUS:  1 - Symptomatic but completely ambulatory  REVIEW OF SYSTEMS: Patient denies any weight loss, fatigue, weakness, fever, chills or night sweats. Patient denies any loss of vision, blurred vision. Patient denies any ringing  of the ears or hearing loss. No irregular heartbeat. Patient denies heart murmur or history of fainting. Patient denies any chest pain or pain radiating to her upper extremities. Patient denies any shortness of breath, difficulty breathing at night, cough or hemoptysis. Patient denies any swelling in the lower legs. Patient  denies any nausea vomiting, vomiting of blood, or coffee ground material in the vomitus. Patient denies any stomach pain. Patient states has had normal bowel movements no significant constipation or diarrhea. Patient denies any dysuria, hematuria or significant nocturia. Patient denies any problems walking, swelling in the joints or loss of balance. Patient denies any skin changes, loss of hair or loss of weight. Patient denies any excessive worrying or anxiety or significant depression. Patient denies any problems with insomnia. Patient denies excessive thirst, polyuria, polydipsia. Patient denies any swollen glands, patient denies easy bruising or easy bleeding. Patient denies any recent infections, allergies or URI. Patient "s visual fields have not changed significantly in recent time.   PHYSICAL EXAM: BP 104/60   Pulse 91   Temp (!) 96.6 F (35.9 C)   Wt 193 lb 12.8 oz (87.9 kg)   BMI 30.35 kg/m  Range of motion of her right lower extremity does not elicit pain.  Motor sensory and DTR levels are equal and symmetric in lower extremities.  Well-developed well-nourished patient in NAD. HEENT reveals PERLA, EOMI, discs not visualized.  Oral cavity is clear. No oral mucosal lesions are identified. Neck is clear without evidence of cervical or supraclavicular adenopathy. Lungs are clear to A&P. Cardiac examination is essentially unremarkable with regular rate and rhythm without murmur rub or thrill. Abdomen is benign with no organomegaly or masses noted. Motor sensory and DTR levels are equal and symmetric in the upper and lower extremities. Cranial nerves II through XII are grossly intact. Proprioception is intact. No peripheral adenopathy or edema is identified. No motor or sensory levels are noted. Crude visual fields are within normal range.  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  PLAN: This time I referred the patient to medical oncology.  I have ordered a bone scan and CT scan of chest abdomen pelvis.  I have also tentatively ordered CT simulation to her sacrum for next week.  Based on her CT findings may find a viable area for tissue biopsy to identify source of her malignancy.  Scans and her case were reviewed with the patient she comprehends my treatment plan well.  I would like to take this opportunity to thank you for allowing me to participate in the care of your patient.Noreene Filbert, MD

## 2018-12-16 ENCOUNTER — Encounter: Payer: Self-pay | Admitting: Oncology

## 2018-12-16 ENCOUNTER — Ambulatory Visit
Admission: RE | Admit: 2018-12-16 | Discharge: 2018-12-16 | Disposition: A | Discharge status: Home / Self Care | Payer: Medicare Other | Source: Ambulatory Visit | Attending: Oncology | Admitting: Oncology

## 2018-12-16 ENCOUNTER — Other Ambulatory Visit: Payer: Self-pay

## 2018-12-16 ENCOUNTER — Inpatient Hospital Stay: Payer: Medicare Other | Attending: Oncology | Admitting: Oncology

## 2018-12-16 ENCOUNTER — Inpatient Hospital Stay: Payer: Medicare Other

## 2018-12-16 VITALS — BP 131/82 | HR 102 | Temp 96.2°F | Ht 66.5 in | Wt 190.4 lb

## 2018-12-16 DIAGNOSIS — M533 Sacrococcygeal disorders, not elsewhere classified: Secondary | ICD-10-CM

## 2018-12-16 DIAGNOSIS — R590 Localized enlarged lymph nodes: Secondary | ICD-10-CM

## 2018-12-16 DIAGNOSIS — I10 Essential (primary) hypertension: Secondary | ICD-10-CM

## 2018-12-16 DIAGNOSIS — R918 Other nonspecific abnormal finding of lung field: Secondary | ICD-10-CM

## 2018-12-16 DIAGNOSIS — Z87891 Personal history of nicotine dependence: Secondary | ICD-10-CM

## 2018-12-16 DIAGNOSIS — R0602 Shortness of breath: Secondary | ICD-10-CM | POA: Diagnosis not present

## 2018-12-16 DIAGNOSIS — E790 Hyperuricemia without signs of inflammatory arthritis and tophaceous disease: Secondary | ICD-10-CM

## 2018-12-16 DIAGNOSIS — Z79899 Other long term (current) drug therapy: Secondary | ICD-10-CM

## 2018-12-16 DIAGNOSIS — R222 Localized swelling, mass and lump, trunk: Secondary | ICD-10-CM

## 2018-12-16 LAB — COMPREHENSIVE METABOLIC PANEL
ALT: 114 U/L — ABNORMAL HIGH (ref 0–44)
AST: 88 U/L — ABNORMAL HIGH (ref 15–41)
Albumin: 3.4 g/dL — ABNORMAL LOW (ref 3.5–5.0)
Alkaline Phosphatase: 166 U/L — ABNORMAL HIGH (ref 38–126)
Anion gap: 12 (ref 5–15)
BUN: 28 mg/dL — ABNORMAL HIGH (ref 8–23)
CO2: 25 mmol/L (ref 22–32)
Calcium: 12.6 mg/dL — ABNORMAL HIGH (ref 8.9–10.3)
Chloride: 99 mmol/L (ref 98–111)
Creatinine, Ser: 1.02 mg/dL — ABNORMAL HIGH (ref 0.44–1.00)
GFR calc Af Amer: >60 mL/min (ref >60)
GFR calc non Af Amer: 54 mL/min — ABNORMAL LOW (ref >60)
Glucose, Bld: 121 mg/dL — ABNORMAL HIGH (ref 70–99)
Potassium: 3.2 mmol/L — ABNORMAL LOW (ref 3.5–5.1)
Sodium: 136 mmol/L (ref 135–145)
Total Bilirubin: 0.8 mg/dL (ref 0.3–1.2)
Total Protein: 7.2 g/dL (ref 6.5–8.1)

## 2018-12-16 LAB — CBC WITH DIFFERENTIAL/PLATELET
Abs Immature Granulocytes: 0.13 10*3/uL — ABNORMAL HIGH (ref 0.00–0.07)
Basophils Absolute: 0.1 10*3/uL (ref 0.0–0.1)
Basophils Relative: 1 %
Eosinophils Absolute: 0.2 10*3/uL (ref 0.0–0.5)
Eosinophils Relative: 2 %
HCT: 42.2 % (ref 36.0–46.0)
Hemoglobin: 14.4 g/dL (ref 12.0–15.0)
Immature Granulocytes: 1 %
Lymphocytes Relative: 12 %
Lymphs Abs: 1.8 10*3/uL (ref 0.7–4.0)
MCH: 31.4 pg (ref 26.0–34.0)
MCHC: 34.1 g/dL (ref 30.0–36.0)
MCV: 92.1 fL (ref 80.0–100.0)
Monocytes Absolute: 1.2 10*3/uL — ABNORMAL HIGH (ref 0.1–1.0)
Monocytes Relative: 8 %
Neutro Abs: 11.2 10*3/uL — ABNORMAL HIGH (ref 1.7–7.7)
Neutrophils Relative %: 76 %
Platelets: 258 10*3/uL (ref 150–400)
RBC: 4.58 MIL/uL (ref 3.87–5.11)
RDW: 13.2 % (ref 11.5–15.5)
WBC: 14.7 10*3/uL — ABNORMAL HIGH (ref 4.0–10.5)
nRBC: 0 % (ref 0.0–0.2)

## 2018-12-16 LAB — LACTATE DEHYDROGENASE: LDH: 199 U/L — ABNORMAL HIGH (ref 98–192)

## 2018-12-16 LAB — URIC ACID: Uric Acid, Serum: 9.6 mg/dL — ABNORMAL HIGH (ref 2.5–7.1)

## 2018-12-16 MED ORDER — HYDROCODONE-ACETAMINOPHEN 5-325 MG PO TABS: 1.0000 | ORAL_TABLET | Freq: Four times a day (QID) | ORAL | 0 refills | Status: DC | PRN

## 2018-12-16 MED ORDER — GABAPENTIN 300 MG PO CAPS: 300.0000 mg | ORAL_CAPSULE | Freq: Three times a day (TID) | ORAL | 0 refills | Status: AC

## 2018-12-16 NOTE — Progress Notes (Signed)
Patient here today for new patient consult for sacral mass & bone metastasis

## 2018-12-17 ENCOUNTER — Encounter: Payer: Self-pay | Admitting: Emergency Medicine

## 2018-12-17 ENCOUNTER — Ambulatory Visit
Admission: RE | Admit: 2018-12-17 | Discharge: 2018-12-17 | Disposition: A | Discharge status: Home / Self Care | Payer: Medicare Other | Source: Ambulatory Visit | Attending: Radiation Oncology | Admitting: Radiation Oncology

## 2018-12-17 ENCOUNTER — Inpatient Hospital Stay
Admission: EM | Admit: 2018-12-17 | Discharge: 2018-12-26 | DRG: 987 | Disposition: A | Discharge status: Home / Self Care | Payer: Medicare Other | Attending: Specialist | Admitting: Specialist

## 2018-12-17 ENCOUNTER — Other Ambulatory Visit: Payer: Self-pay

## 2018-12-17 DIAGNOSIS — J189 Pneumonia, unspecified organism: Secondary | ICD-10-CM | POA: Diagnosis present

## 2018-12-17 DIAGNOSIS — C7951 Secondary malignant neoplasm of bone: Secondary | ICD-10-CM | POA: Diagnosis present

## 2018-12-17 DIAGNOSIS — E876 Hypokalemia: Secondary | ICD-10-CM | POA: Diagnosis present

## 2018-12-17 DIAGNOSIS — G629 Polyneuropathy, unspecified: Secondary | ICD-10-CM | POA: Diagnosis present

## 2018-12-17 DIAGNOSIS — M5441 Lumbago with sciatica, right side: Secondary | ICD-10-CM

## 2018-12-17 DIAGNOSIS — Z86711 Personal history of pulmonary embolism: Secondary | ICD-10-CM

## 2018-12-17 DIAGNOSIS — I2699 Other pulmonary embolism without acute cor pulmonale: Secondary | ICD-10-CM

## 2018-12-17 DIAGNOSIS — Z825 Family history of asthma and other chronic lower respiratory diseases: Secondary | ICD-10-CM

## 2018-12-17 DIAGNOSIS — J9600 Acute respiratory failure, unspecified whether with hypoxia or hypercapnia: Secondary | ICD-10-CM

## 2018-12-17 DIAGNOSIS — Z87891 Personal history of nicotine dependence: Secondary | ICD-10-CM

## 2018-12-17 DIAGNOSIS — C7931 Secondary malignant neoplasm of brain: Secondary | ICD-10-CM | POA: Diagnosis present

## 2018-12-17 DIAGNOSIS — C771 Secondary and unspecified malignant neoplasm of intrathoracic lymph nodes: Secondary | ICD-10-CM | POA: Diagnosis present

## 2018-12-17 DIAGNOSIS — I82442 Acute embolism and thrombosis of left tibial vein: Secondary | ICD-10-CM | POA: Diagnosis present

## 2018-12-17 DIAGNOSIS — Z85828 Personal history of other malignant neoplasm of skin: Secondary | ICD-10-CM

## 2018-12-17 DIAGNOSIS — M899 Disorder of bone, unspecified: Secondary | ICD-10-CM

## 2018-12-17 DIAGNOSIS — C3431 Malignant neoplasm of lower lobe, right bronchus or lung: Principal | ICD-10-CM | POA: Diagnosis present

## 2018-12-17 DIAGNOSIS — Z8042 Family history of malignant neoplasm of prostate: Secondary | ICD-10-CM

## 2018-12-17 DIAGNOSIS — I10 Essential (primary) hypertension: Secondary | ICD-10-CM | POA: Diagnosis present

## 2018-12-17 DIAGNOSIS — Z20828 Contact with and (suspected) exposure to other viral communicable diseases: Secondary | ICD-10-CM | POA: Diagnosis present

## 2018-12-17 DIAGNOSIS — E785 Hyperlipidemia, unspecified: Secondary | ICD-10-CM | POA: Diagnosis present

## 2018-12-17 DIAGNOSIS — Z808 Family history of malignant neoplasm of other organs or systems: Secondary | ICD-10-CM

## 2018-12-17 DIAGNOSIS — J9601 Acute respiratory failure with hypoxia: Secondary | ICD-10-CM | POA: Diagnosis present

## 2018-12-17 DIAGNOSIS — Z515 Encounter for palliative care: Secondary | ICD-10-CM

## 2018-12-17 DIAGNOSIS — E790 Hyperuricemia without signs of inflammatory arthritis and tophaceous disease: Secondary | ICD-10-CM

## 2018-12-17 DIAGNOSIS — Z803 Family history of malignant neoplasm of breast: Secondary | ICD-10-CM

## 2018-12-17 DIAGNOSIS — Z7951 Long term (current) use of inhaled steroids: Secondary | ICD-10-CM

## 2018-12-17 DIAGNOSIS — E871 Hypo-osmolality and hyponatremia: Secondary | ICD-10-CM | POA: Diagnosis present

## 2018-12-17 DIAGNOSIS — J90 Pleural effusion, not elsewhere classified: Secondary | ICD-10-CM | POA: Diagnosis present

## 2018-12-17 DIAGNOSIS — R0902 Hypoxemia: Secondary | ICD-10-CM

## 2018-12-17 DIAGNOSIS — C787 Secondary malignant neoplasm of liver and intrahepatic bile duct: Secondary | ICD-10-CM | POA: Diagnosis present

## 2018-12-17 DIAGNOSIS — M5442 Lumbago with sciatica, left side: Secondary | ICD-10-CM

## 2018-12-17 DIAGNOSIS — C799 Secondary malignant neoplasm of unspecified site: Secondary | ICD-10-CM

## 2018-12-17 DIAGNOSIS — I82812 Embolism and thrombosis of superficial veins of left lower extremities: Secondary | ICD-10-CM | POA: Diagnosis present

## 2018-12-17 DIAGNOSIS — C3491 Malignant neoplasm of unspecified part of right bronchus or lung: Secondary | ICD-10-CM

## 2018-12-17 DIAGNOSIS — Z8261 Family history of arthritis: Secondary | ICD-10-CM

## 2018-12-17 DIAGNOSIS — F419 Anxiety disorder, unspecified: Secondary | ICD-10-CM | POA: Diagnosis present

## 2018-12-17 DIAGNOSIS — E860 Dehydration: Secondary | ICD-10-CM | POA: Diagnosis present

## 2018-12-17 DIAGNOSIS — Z8249 Family history of ischemic heart disease and other diseases of the circulatory system: Secondary | ICD-10-CM

## 2018-12-17 DIAGNOSIS — R918 Other nonspecific abnormal finding of lung field: Secondary | ICD-10-CM

## 2018-12-17 LAB — COMPREHENSIVE METABOLIC PANEL
ALT: 95 U/L — ABNORMAL HIGH (ref 0–44)
AST: 74 U/L — ABNORMAL HIGH (ref 15–41)
Albumin: 2.9 g/dL — ABNORMAL LOW (ref 3.5–5.0)
Alkaline Phosphatase: 153 U/L — ABNORMAL HIGH (ref 38–126)
Anion gap: 11 (ref 5–15)
BUN: 26 mg/dL — ABNORMAL HIGH (ref 8–23)
CO2: 24 mmol/L (ref 22–32)
Calcium: 12.2 mg/dL — ABNORMAL HIGH (ref 8.9–10.3)
Chloride: 102 mmol/L (ref 98–111)
Creatinine, Ser: 0.83 mg/dL (ref 0.44–1.00)
GFR calc Af Amer: >60 mL/min (ref >60)
GFR calc non Af Amer: >60 mL/min (ref >60)
Glucose, Bld: 134 mg/dL — ABNORMAL HIGH (ref 70–99)
Potassium: 2.7 mmol/L — CL (ref 3.5–5.1)
Sodium: 137 mmol/L (ref 135–145)
Total Bilirubin: 0.8 mg/dL (ref 0.3–1.2)
Total Protein: 6.4 g/dL — ABNORMAL LOW (ref 6.5–8.1)

## 2018-12-17 LAB — BETA 2 MICROGLOBULIN, SERUM: Beta-2 Microglobulin: 3 mg/L — ABNORMAL HIGH (ref 0.6–2.4)

## 2018-12-17 LAB — CBC
HCT: 40.7 % (ref 36.0–46.0)
Hemoglobin: 13.8 g/dL (ref 12.0–15.0)
MCH: 31.7 pg (ref 26.0–34.0)
MCHC: 33.9 g/dL (ref 30.0–36.0)
MCV: 93.3 fL (ref 80.0–100.0)
Platelets: 246 10*3/uL (ref 150–400)
RBC: 4.36 MIL/uL (ref 3.87–5.11)
RDW: 13.2 % (ref 11.5–15.5)
WBC: 14 10*3/uL — ABNORMAL HIGH (ref 4.0–10.5)
nRBC: 0 % (ref 0.0–0.2)

## 2018-12-17 LAB — SARS CORONAVIRUS 2 BY RT PCR (HOSPITAL ORDER, PERFORMED IN ~~LOC~~ HOSPITAL LAB): SARS Coronavirus 2: NEGATIVE

## 2018-12-17 LAB — KAPPA/LAMBDA LIGHT CHAINS
Kappa free light chain: 32.2 mg/L — ABNORMAL HIGH (ref 3.3–19.4)
Kappa, lambda light chain ratio: 1.09 (ref 0.26–1.65)
Lambda free light chains: 29.5 mg/L — ABNORMAL HIGH (ref 5.7–26.3)

## 2018-12-17 MED ORDER — POTASSIUM CHLORIDE 10 MEQ/100ML IV SOLN
10.0000 meq | INTRAVENOUS | Status: AC
  Filled 2018-12-17 (×2): qty 100

## 2018-12-17 MED ORDER — IOHEXOL 300 MG/ML  SOLN
100.0000 mL | Freq: Once | INTRAMUSCULAR | Status: AC | PRN
  Administered 2018-12-17: 100 mL via INTRAVENOUS

## 2018-12-17 MED ORDER — SODIUM CHLORIDE 0.9 % IV SOLN
Freq: Once | INTRAVENOUS | Status: AC
  Administered 2018-12-17: 999 mL via INTRAVENOUS

## 2018-12-17 MED ORDER — POTASSIUM CHLORIDE 20 MEQ PO PACK
20.0000 meq | PACK | Freq: Two times a day (BID) | ORAL | Status: DC
  Administered 2018-12-18 – 2018-12-25 (×17): 20 meq via ORAL
  Filled 2018-12-17 (×18): qty 1

## 2018-12-17 NOTE — ED Notes (Signed)
Pt lying in bed speaking with this RN in NAD, A&Ox4. Pt talking in complete sentences with no difficulty

## 2018-12-17 NOTE — ED Provider Notes (Signed)
[facility] Emergency Department Provider Note  Time seen: 8:50 PM  I have reviewed the triage vital signs and the nursing notes.   HISTORY  Chief Complaint Shortness of Breath, Hip Pain, and Fatigue   HPI Elizabeth [name] is a 73 y.o. female with a past medical history of hypertension, hyperlipidemia, presents to the emergency department for abnormal lab and CT.  According to the patient over the past 1 week she has had progressive shortness of breath coughing and feeling somewhat weak.  Patient had lab work done that appeared to be quite abnormal and was referred for CT scans of her chest abdomen and pelvis which were performed today.  CT is resulted abnormal and the patient was referred to the emergency department for likely admission..  The patient appears well, she is satting in the upper 80s on room air with no baseline O2 requirement.  CT scans performed today unfortunately appear to show lung cancer with severe diffuse metastatic disease.  Patient denies any known fever.  Patient is very pleasant and well-appearing at this time.   Past Medical History:  Diagnosis Date  . Allergy   . Cancer ([place]) 09/2014   Skin Cancer  . Hyperlipidemia   . Hypertension     Patient Active Problem List   Diagnosis Date Noted  . 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 Allergic rhinitis 09/20/2014  . 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 Colitis 09/20/2014  . Dermatitis, eczematoid 09/20/2014  . Elevated hemoglobin ([place]) 09/20/2014  . Calcium blood increased 09/20/2014  . Blood glucose elevated 09/20/2014  . Hypertension 09/20/2014  . Hypertriglyceridemia 09/20/2014    Past Surgical History:  Procedure Laterality Date  . ABDOMINAL HYSTERECTOMY  1989   due to menorrhagia  . COLONOSCOPY    . DILATION AND CURETTAGE OF UTERUS  1977  . SKIN CANCER EXCISION  10/2014   on chest; Dr. Evorn Gong  . TRIGGER FINGER RELEASE Right 2000  . WRIST ARTHROPLASTY Left 1991    Prior to  Admission medications   Medication Sig Start Date End Date Taking? Authorizing Provider  albuterol (VENTOLIN HFA) 108 (90 Base) MCG/ACT inhaler Inhale 2 puffs into the lungs every 6 (six) hours as needed for wheezing or shortness of breath. 12/09/18   Burnette, Clearnce Sorrel, PA-C  benzonatate (TESSALON) 200 MG capsule Take 1 capsule (200 mg total) by mouth 3 (three) times daily as needed. 12/09/18   Mar Daring, PA-C  cetirizine (ZYRTEC) 10 MG tablet TAKE 1 TABLET BY MOUTH ONCE A DAY 12/04/17   Mar Daring, PA-C  cholecalciferol (VITAMIN D) 1000 units tablet Take 1,000 Units by mouth daily.    [provider]  CINNAMON PO Take by mouth daily.     [provider]  fluticasone (FLONASE) 50 MCG/ACT nasal spray PLACE 2 SPRAYS INTO BOTH NOSTRILS DAILY 09/29/18   Mar Daring, PA-C  Fluticasone-Salmeterol (ADVAIR DISKUS) 250-50 MCG/DOSE AEPB INHALE 1 PUFF INTO LUNGS EVERY 12 HOURS (TWICE DAILY) RINSE MOUTH AFTER USE. Patient taking differently: as needed. INHALE 1 PUFF INTO LUNGS EVERY 12 HOURS (TWICE DAILY) RINSE MOUTH AFTER USE 06/11/18   Mar Daring, PA-C  folic acid (FOLVITE) 1 MG tablet Take 1 mg by mouth daily.     [provider]  gabapentin (NEURONTIN) 300 MG capsule Take 1 capsule (300 mg total) by mouth 3 (three) times daily. 12/16/18   Earlie Server, MD  Ginger, Zingiber officinalis, (GINGER PO) Take by mouth.    [provider]  hydrochlorothiazide (MICROZIDE) 12.5 MG capsule TAKE 1 CAPSULE BY MOUTH ONCE DAILY 11/30/18   Mar Daring, PA-C  HYDROcodone-acetaminophen (NORCO) 5-325 MG tablet Take 1 tablet by mouth every 6 (six) hours as needed for moderate pain. 12/16/18   Earlie Server, MD  losartan (COZAAR) 100 MG tablet TAKE 1 TABLET BY MOUTH ONCE A DAY 08/05/18   Mar Daring, PA-C  methotrexate 2.5 MG tablet Take 7.5 mg by mouth once a week.     [provider]  metoprolol succinate (TOPROL-XL) 50 MG 24 hr tablet  TAKE 1 TABLET BY MOUTH TWICE A DAY WITH OR IMMEDIATELY FOLLOWING A MEAL 08/23/18   Burnette, Clearnce Sorrel, PA-C  Misc Natural Products (HERBAL ENERGY COMPLEX PO) Take by mouth daily.     [provider]  montelukast (SINGULAIR) 10 MG tablet TAKE 1 TABLET BY MOUTH ONCE DAILY 09/24/18   Mar Daring, PA-C  Multiple Vitamin tablet Take 1 tablet by mouth daily.  02/27/11   [provider]  Multiple Vitamins-Minerals (PRESERVISION AREDS PO) Take by mouth daily.     [provider]  Multiple Vitamins-Minerals (ZINC PO) Take by mouth daily.     [provider]  Probiotic Product (PROBIOTIC-10 PO) Take by mouth daily.    [provider]  tiZANidine (ZANAFLEX) 4 MG tablet TAKE 1 TABLET BY MOUTH 3 TIMES DAILY AS NEEDED FOR MUSCLE SPASMS 11/11/18   Mar Daring, PA-C  traMADol (ULTRAM) 50 MG tablet Take 50 mg by mouth every 8 (eight) hours as needed.    [provider]  TURMERIC PO Take by mouth daily.     [provider]  vitamin E 1000 UNIT capsule Take 1,000 Units by mouth daily.    [provider]    Allergies  Allergen Reactions  . Furadantin  [Nitrofurantoin]   . Phenobarbital Rash    Family History  Problem Relation Age of Onset  . Arthritis Mother   . Cerebrovascular Disease Mother   . Heart disease Mother   . Vascular Disease Mother   . Hypertension Father   . COPD Father   . Throat cancer Father   . Melanoma Father   . Prostate cancer Father   . Skin cancer Brother   . Skin cancer Brother   . Breast cancer Paternal Aunt 79    Social History Social History   Tobacco Use  . Smoking status: Former Smoker    Packs/day: 0.50    Years: 35.00    Pack years: 17.50    Quit date: 05/20/2003    Years since quitting: 15.5  . Smokeless tobacco: Never Used  Substance Use Topics  . Alcohol use: Yes    Alcohol/week: 14.0 standard drinks    Types: 14 Glasses of wine per week    Comment: occasional; 12/16/18  - states no alcohol since 12/02/18   . Drug use: No    Review of Systems Constitutional: Negative for fever Cardiovascular: Negative for chest pain. Respiratory: Positive for shortness of breath, worse with exertion.  Positive for cough. Gastrointestinal: Negative for abdominal pain Musculoskeletal: Negative for musculoskeletal complaints Skin: Negative for skin complaints  Neurological: Negative for headache All other ROS negative  ____________________________________________   PHYSICAL EXAM:  VITAL SIGNS: ED Triage Vitals  Enc Vitals Group     BP 12/17/18 2009 (!) 91/44     Pulse Rate 12/17/18 2009 (!) 112     Resp 12/17/18 2009 20     Temp 12/17/18 2009 97.8 F (  36.6 C)     Temp Source 12/17/18 2009 Oral     SpO2 12/17/18 2009 (!) 88 %     Weight 12/17/18 2010 190 lb 3.2 oz (86.3 kg)     Height 12/17/18 2010 5\' 6"  (1.676 m)     Head Circumference --      Peak Flow --      Pain Score --      Pain Loc --      Pain Edu? --      Excl. in [place]? --    Constitutional: Alert and oriented. Well appearing and in no distress. Eyes: Normal exam ENT      Head: Normocephalic and atraumatic.      Mouth/Throat: Mucous membranes are moist. Cardiovascular: Normal rate, regular rhythm. Respiratory: Normal respiratory effort without tachypnea nor retractions. Breath sounds are clear Gastrointestinal: Soft and nontender. No distention.   Musculoskeletal: Nontender with normal range of motion in all extremities.  Neurologic:  Normal speech and language. No gross focal neurologic deficits Skin:  Skin is warm, dry and intact.  Psychiatric: Mood and affect are normal.   ____________________________________________    EKG  EKG viewed and interpreted by myself shows a sinus tachycardia 108 bpm with a narrow QRS, normal axis, normal intervals, nonspecific ST changes.  ____________________________________________  INITIAL IMPRESSION / ASSESSMENT AND PLAN / ED COURSE  Pertinent labs &  imaging results that were available during my care of the patient were reviewed by me and considered in my medical decision making (see chart for details).   Patient presents to the emergency department for abnormal lab work and CT.  Patient CT scan today shows a large necrotic mass in the lungs most consistent with primary lung cancer with severe metastatic spread throughout the lungs liver and possibly abdomen, with likely lytic lesions as well.  Patient is hypoxic 88% on room air.  Mildly hypotensive currently 91/44.  We will dose IV fluids, place the patient on oxygen.  We will check basic lab work, patient will require admission to the hospital service for her hypoxia and expedited oncologic work-up.  Patient agreeable to plan of care.  Elizabeth [name] was evaluated in Emergency Department on 12/17/2018 for the symptoms described in the history of present illness. She was evaluated in the context of the global COVID-19 pandemic, which necessitated consideration that the patient might be at risk for infection with the SARS-CoV-2 virus that causes COVID-19. Institutional protocols and algorithms that pertain to the evaluation of patients at risk for COVID-19 are in a state of rapid change based on information released by regulatory bodies including the CDC and federal and state organizations. These policies and algorithms were followed during the patient's care in the ED.  ____________________________________________   FINAL CLINICAL IMPRESSION(S) / ED DIAGNOSES  Hypoxia Lung mass   Harvest Dark, MD 12/17/18 2054

## 2018-12-17 NOTE — Progress Notes (Signed)
Hematology/Oncology Consult note [facility] Telephone:([phone] [phone]   Patient Care Team: Rubye Beach as PCP - General (Family Medicine) Dasher, Rayvon Char, MD as Consulting Physician (Dermatology) Yolonda Kida, MD as Consulting Physician (Cardiology) Dingeldein, Remo Lipps, MD as Consulting Physician (Ophthalmology) Kipp Laurence, MD as Referring Physician (Audiology) Grant Fontana, Roslyn as Referring Physician (Chiropractic Medicine) Watt Climes, PA as Physician Assistant (Physician Assistant)  REFERRING PROVIDER: Noreene Filbert, MD  CHIEF COMPLAINTS/REASON FOR VISIT:  Evaluation of sacrum mass  HISTORY OF PRESENTING ILLNESS:   Elizabeth [name] is a  73 y.o.  female with PMH listed below was seen in consultation at the request of  Noreene Filbert, MD  for evaluation of sacral mass. Patient reports progressively worsened right hip lower back pain for the past few months.  Aching in nature, usually triggered when she changes position from sitting to standing.  Pain worsened despite conservative measures at home.  Patient was evaluated by primary care provider on 09/30/2018, advised to try Medrol Dosepak and Zanaflex, stretching and exercise.  Symptoms did not improve.  Patient was seen by primary care provider on 10/14/2018 again, celecoxib was added. Hip unilateral x-ray was obtained and showed mild degenerative changes of right hip. 12/02/2018 MRI lumbar spine without contrast showed expansile infiltrating process in the right sacrum involving multiple right sacrum segments.  Tumor also infiltrates right posterior iliac bone.  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  Right L5 and S1 foraminal encroachment due to tumor extension into soft tissue.  Small lesion in T12 also supportive of metastatic disease.  Patient was referred to radiation oncology Dr. Baruch Gouty for evaluation.  Patient was seen by Dr. Baruch Gouty on  12/15/2018.  Bone scan, CT chest abdomen pelvis have been ordered and is scheduled.  Patient also referred to medical oncology for further evaluation. Patient reports ongoing right hip/lower back pain, usually triggered when she changes her position. She has tried celecoxib, tramadol without good symptom control.  Requests a stronger medication to relieve her pain. She has also had decreased appetite lately. Feels shortness of breath.  Denies any chest pain or abdominal pain. Some sweating at night. She is a former smoker, quit in 2005.  History of 17.5-pack-year smoking history.  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  Reports that she has had symptoms for many years-rash on palms, was previously thought to be contact dermatitis, eventually diagnosed by dermatology in 2016. Monthly symptom is well controlled.  Review of Systems  Constitutional: Positive for appetite change and fatigue. Negative for chills and fever.  HENT:   Negative for hearing loss and voice change.   Eyes: Negative for eye problems.  Respiratory: Positive for shortness of breath. Negative for chest tightness and cough.   Cardiovascular: Negative for chest pain.  Gastrointestinal: Negative for abdominal distention, abdominal pain and blood in stool.  Endocrine: Negative for hot flashes.  Genitourinary: Negative for difficulty urinating and frequency.   Musculoskeletal: Positive for back pain. Negative for arthralgias.  Skin: Negative for itching and rash.  Neurological: Negative for extremity weakness.  Hematological: Negative for adenopathy.  Psychiatric/Behavioral: Negative for confusion.    MEDICAL HISTORY:  Past Medical History:  Diagnosis Date   Allergy    Cancer ([place]) 09/2014   Skin Cancer   Hyperlipidemia    Hypertension     SURGICAL HISTORY: Past Surgical History:  Procedure Laterality Date   ABDOMINAL HYSTERECTOMY  1989  due to menorrhagia    COLONOSCOPY     [place]   SKIN CANCER EXCISION  10/2014   on chest; Dr. Evorn Gong   TRIGGER FINGER RELEASE Right 2000   WRIST ARTHROPLASTY Left 1991    SOCIAL HISTORY: Social History   Socioeconomic History   Marital status: Married    Spouse name: Eulas Post   Number of children: 2   Years of education: Not on file   Highest education level: Bachelor's degree (e.g., BA, AB, BS)  Occupational History   Occupation: retired  Scientist, product/process development strain: Not hard at [employer]    Worry: Never true    Inability: Never true   Transportation needs    Medical: No    Non-medical: No  Tobacco Use   Smoking status: Former Smoker    Packs/day: 0.50    Years: 35.00    Pack years: 17.50    Quit date: 05/20/2003    Years since quitting: 15.5   Smokeless tobacco: Never Used  Substance and Sexual Activity   Alcohol use: Yes    Alcohol/week: 14.0 standard drinks    Types: 14 Glasses of wine per week    Comment: occasional; 12/16/18 - states no alcohol since 12/02/18    Drug use: No   Sexual activity: Not on file  Lifestyle   Physical activity    Days per week: 0 days    Minutes per session: 0 min   Stress: Not at all  Relationships   Social connections    Talks on phone: Patient refused    Gets together: Patient refused    Attends religious service: Patient refused    Active member of club or organization: Patient refused    Attends meetings of clubs or organizations: Patient refused    Relationship status: Patient refused   Intimate partner violence    Fear of current or ex partner: Patient refused    Emotionally abused: Patient refused    Physically abused: Patient refused    Forced sexual activity: Patient refused  Other Topics Concern   Not on file  Social History Narrative   Not on file    FAMILY HISTORY: Family History  Problem Relation Age of Onset   Arthritis Mother    Cerebrovascular  Disease Mother    Heart disease Mother    Vascular Disease Mother    Hypertension Father    COPD Father    Throat cancer Father    Melanoma Father    Prostate cancer Father    Skin cancer Brother    Skin cancer Brother    Breast cancer Paternal Aunt 26    ALLERGIES:  is allergic to furadantin  [nitrofurantoin] and phenobarbital.  MEDICATIONS:  Current Outpatient Medications  Medication Sig Dispense Refill   albuterol (VENTOLIN HFA) 108 (90 Base) MCG/ACT inhaler Inhale 2 puffs into the lungs every 6 (six) hours as needed for wheezing or shortness of breath. 18 g 0   benzonatate (TESSALON) 200 MG capsule Take 1 capsule (200 mg total) by mouth 3 (three) times daily as needed. 30 capsule 0   cetirizine (ZYRTEC) 10 MG tablet TAKE 1 TABLET BY MOUTH ONCE A DAY 90 tablet 1   cholecalciferol (VITAMIN D) 1000 units tablet Take 1,000 Units by mouth daily.     CINNAMON PO Take by mouth daily.      fluticasone (FLONASE) 50 MCG/ACT nasal spray PLACE 2 SPRAYS INTO BOTH NOSTRILS DAILY  16 g 1   Fluticasone-Salmeterol (ADVAIR DISKUS) 250-50 MCG/DOSE AEPB INHALE 1 PUFF INTO LUNGS EVERY 12 HOURS (TWICE DAILY) RINSE MOUTH AFTER USE. (Patient taking differently: as needed. INHALE 1 PUFF INTO LUNGS EVERY 12 HOURS (TWICE DAILY) RINSE MOUTH AFTER USE) 60 each 5   folic acid (FOLVITE) 1 MG tablet Take 1 mg by mouth daily.      Ginger, Zingiber officinalis, (GINGER PO) Take by mouth.     hydrochlorothiazide (MICROZIDE) 12.5 MG capsule TAKE 1 CAPSULE BY MOUTH ONCE DAILY 90 capsule 3   losartan (COZAAR) 100 MG tablet TAKE 1 TABLET BY MOUTH ONCE A DAY 90 tablet 3   methotrexate 2.5 MG tablet Take 7.5 mg by mouth once a week.      metoprolol succinate (TOPROL-XL) 50 MG 24 hr tablet TAKE 1 TABLET BY MOUTH TWICE A DAY WITH OR IMMEDIATELY FOLLOWING A MEAL 180 tablet 3   Misc Natural Products (HERBAL ENERGY COMPLEX PO) Take by mouth daily.      montelukast (SINGULAIR) 10 MG tablet TAKE 1 TABLET  BY MOUTH ONCE DAILY 90 tablet 3   Multiple Vitamin tablet Take 1 tablet by mouth daily.      Multiple Vitamins-Minerals (PRESERVISION AREDS PO) Take by mouth daily.      Multiple Vitamins-Minerals (ZINC PO) Take by mouth daily.      Probiotic Product (PROBIOTIC-10 PO) Take by mouth daily.     tiZANidine (ZANAFLEX) 4 MG tablet TAKE 1 TABLET BY MOUTH 3 TIMES DAILY AS NEEDED FOR MUSCLE SPASMS 90 tablet 3   traMADol (ULTRAM) 50 MG tablet Take 50 mg by mouth every 8 (eight) hours as needed.     TURMERIC PO Take by mouth daily.      vitamin E 1000 UNIT capsule Take 1,000 Units by mouth daily.     gabapentin (NEURONTIN) 300 MG capsule Take 1 capsule (300 mg total) by mouth 3 (three) times daily. 90 capsule 0   HYDROcodone-acetaminophen (NORCO) 5-325 MG tablet Take 1 tablet by mouth every 6 (six) hours as needed for moderate pain. 45 tablet 0   No current facility-administered medications for this visit.      PHYSICAL EXAMINATION: ECOG PERFORMANCE STATUS: 1 - Symptomatic but completely ambulatory Vitals:   12/16/18 1433  BP: 131/82  Pulse: (!) 102  Temp: (!) 96.2 F (35.7 C)   Filed Weights   12/16/18 1433  Weight: 190 lb 6 oz (86.4 kg)    Physical Exam Constitutional:      General: She is not in acute distress.    Comments: Walk in  HENT:     Head: Normocephalic and atraumatic.  Eyes:     General: No scleral icterus.    Pupils: Pupils are equal, round, and reactive to light.  Neck:     Musculoskeletal: Normal range of motion and neck supple.  Cardiovascular:     Rate and Rhythm: Normal rate and regular rhythm.     Heart sounds: Normal heart sounds.  Pulmonary:     Effort: Pulmonary effort is normal. No respiratory distress.     Breath sounds: No wheezing.     Comments: Bilateral decreased breath sounds Abdominal:     General: Bowel sounds are normal. There is no distension.     Palpations: Abdomen is soft. There is no mass.     Tenderness: There is no abdominal  tenderness.  Musculoskeletal: Normal range of motion.        General: No deformity.  Skin:    General:  Skin is warm and dry.     Findings: No erythema or rash.  Neurological:     Mental Status: She is alert and oriented to person, place, and time.     Cranial Nerves: No cranial nerve deficit.     Coordination: Coordination normal.  Psychiatric:        Behavior: Behavior normal.        Thought Content: Thought content normal.     LABORATORY DATA:  I have reviewed the data as listed Lab Results  Component Value Date   WBC 14.7 (H) 12/16/2018   HGB 14.4 12/16/2018   HCT 42.2 12/16/2018   MCV 92.1 12/16/2018   PLT 258 12/16/2018   Recent Labs    12/16/18 1505  NA 136  K 3.2*  CL 99  CO2 25  GLUCOSE 121*  BUN 28*  CREATININE 1.02*  CALCIUM 12.6*  GFRNONAA 54*  GFRAA >60  PROT 7.2  ALBUMIN 3.4*  AST 88*  ALT 114*  ALKPHOS 166*  BILITOT 0.8   Iron/TIBC/Ferritin/ %Sat No results found for: IRON, TIBC, FERRITIN, IRONPCTSAT    RADIOGRAPHIC STUDIES: I have personally reviewed the radiological images as listed and agreed with the findings in the report.  Dg Chest 2 View  Result Date: 12/17/2018 CLINICAL DATA:  History of cancer. Former smoker. Shortness of breath. EXAM: CHEST - 2 VIEW COMPARISON:  03/20/2016.  06/28/2012. FINDINGS: Right hilar fullness. Diffuse bilateral interstitial prominence. Multiple bilateral pulmonary nodular densities. These findings suggest malignancy with possible right hilar adenopathy, interstitial tumor spread, and metastatic disease. An inflammatory or infectious process including active granulomatous disease could also present in this fashion. Contrast-enhanced chest CT should be considered for further evaluation. No pleural effusion or pneumothorax. Heart size normal. Thoracic spine scoliosis. IMPRESSION: Right hilar fullness, diffuse bilateral interstitial prominence, and multiple bilateral pulmonary nodular densities. These findings suggest  malignancy with possible right hilar adenopathy, interstitial tumor spread, and metastatic disease. An inflammatory or infectious process including active granulomatous disease could also present this fashion. Contrast-enhanced chest CT should be considered for further evaluation. Electronically Signed   By: Marcello Moores  Register   On: 12/17/2018 08:14   Mr Lumbar Spine Wo Contrast  Result Date: 12/03/2018 CLINICAL DATA:  Sciatica right side. Right back and leg pain 4-5 months. EXAM: MRI LUMBAR SPINE WITHOUT CONTRAST TECHNIQUE: Multiplanar, multisequence MR imaging of the lumbar spine was performed. No intravenous contrast was administered. COMPARISON:  None. FINDINGS: Segmentation:  Normal Alignment:  4 mm anterolisthesis L4-5.  Mild dextroscoliosis. Vertebrae: 1 cm hyperintense lesion in the left posterior T12 vertebral body best seen on inversion recovery. Infiltrating mass lesion in the right sacrum involving the right sacrum. Tumor involves the right S1 vertebral body and pedicle and extends into the right S2 vertebral body. Tumor is low signal on T1 and hyperintense on T2. Extraosseous tumor with narrowing the foramen at L5-S1 and S1-2. Tumor also infiltrates the posterior iliac bone adjacent to the SI joint. Small right SI joint effusion. Conus medullaris and cauda equina: Conus extends to the L1-2 level. Conus and cauda equina appear normal. Paraspinal and other soft tissues: Negative for retroperitoneal adenopathy. Disc levels: L1-2: Mild disc and facet degeneration without stenosis L2-3: Moderate disc degeneration with disc bulging and spurring. Bilateral facet hypertrophy and mild spinal stenosis. L3-4: Disc degeneration and spondylosis. Bilateral facet hypertrophy. Moderate spinal stenosis. Mild to moderate subarticular stenosis bilaterally L4-5: 4 mm anterolisthesis. Diffuse bulging of the disc with moderate to severe facet degeneration. Moderate to severe  spinal stenosis. Mild subarticular stenosis  bilaterally L5-S1: Bilateral facet hypertrophy. Right foraminal encroachment due to tumor extension and spurring. 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 Tumor also infiltrates right posterior iliac bone. 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 Right L5 and S1 foraminal encroachment due to tumor extension into the soft tissues. Small lesion T12 also supportive of metastatic disease. Chondrosarcoma or lymphoma right sacrum less likely. These results were called by telephone at the time of interpretation on 12/03/2018 at 10:27 am to [facility] , who verbally acknowledged these results. Electronically Signed   By: Franchot Gallo M.D.   On: 12/03/2018 10:36      ASSESSMENT & PLAN:  1. Mass of sacrum   2. SOB (shortness of breath)   3. Lung nodules   4. Hilar adenopathy   5. Hyperuricemia    MRI images were independently reviewed by me and discussed with patient. Concerning for infiltrative sacral mass. Agree with CT chest abdomen pelvis and bone scan scheduled. Need biopsy to establish tissue diagnosis. I am waiting for above image results to determine best site of biopsy.  She reports shortness of breath, I will obtain a chest x-ray today. Chest x-ray was independent reviewed by me She has right hilar fullness, diffuse bilateral interstitial prominence, and multiple bilateral pulmonary nodular densities.  Suggest malignancy with possible right hilar adenopathy.  Questionable interstitial tumor spread in the metastatic disease.  Differential includes inflammatory or infectious process presenting similar fraction. CT will provide additional information.   #12/17/2018, labs reviewed and I called patient and communicated with her. I am very concerned that she has developed hypercalcemia with a calcium level of 12.5, hyper uric acidemia, elevated LDH,  Her kidney function has decreased comparing to her  baseline, also transaminitis. Concerning for aggressive metastatic cancer or lymphoma. I recommend patient to have stat CT chest abdomen pelvis today. Also discussed with on-call hospitalist Dr. Posey Pronto.  Due to COVID pandemic, direct admission is not feasible due to lack of COVID testing.  Advised patient to go to emergency room and be evaluated and admitted for management of hypercalcemia and hyperuricemia.  Possible biopsy during admission, depending on CT scan results.  #Hypercalcemia, recommend IV fluid, bisphosphonate. #Hyperuricemia, recommend patient to be started on allopurinol. Called ER and discussed with triage nurse.   Orders Placed This Encounter  Procedures   DG Chest 2 View    Standing Status:   Future    Number of Occurrences:   1    Standing Expiration Date:   12/16/2019    Order Specific Question:   Reason for Exam (SYMPTOM  OR DIAGNOSIS REQUIRED)    Answer:   SOB    Order Specific Question:   Preferred imaging location?    Answer:   [facility]    Order Specific Question:   Radiology Contrast Protocol - do NOT remove file path    Answer:   [email or web address]   CBC with Differential/Platelet    Standing Status:   Future    Number of Occurrences:   1    Standing Expiration Date:   12/16/2019   Comprehensive metabolic panel    Standing Status:   Future    Number of Occurrences:   1    Standing Expiration Date:   12/16/2019   Multiple Myeloma Panel (SPEP&IFE w/QIG)    Standing Status:   Future    Number of Occurrences:   1    Standing Expiration  Date:   12/16/2019   Kappa/lambda light chains    Standing Status:   Future    Number of Occurrences:   1    Standing Expiration Date:   12/16/2019   Beta 2 microglobulin, serum    Standing Status:   Future    Number of Occurrences:   1    Standing Expiration Date:   12/16/2019   Lactate dehydrogenase    Standing Status:   Future    Number of Occurrences:   1    Standing  Expiration Date:   12/16/2019   Flow cytometry panel-leukemia/lymphoma work-up    Standing Status:   Future    Number of Occurrences:   1    Standing Expiration Date:   12/16/2019   Uric acid    All questions were answered. The patient knows to call the clinic with any problems questions or concerns.  cc Noreene Filbert, MD    Return of visit: to be determined.  Thank you for this kind referral and the opportunity to participate in the care of this patient. A copy of today's note is routed to referring provider  Total face to face encounter time for this patient visit was 70 min. >50% of the time was  spent in counseling and coordination of care.    Earlie Server, MD, PhD Hematology Oncology [facility] Pager- [phone] 12/17/2018

## 2018-12-17 NOTE — ED Notes (Signed)
Dr. Paduchowski at bedside.  

## 2018-12-17 NOTE — Progress Notes (Signed)
Advanced care plan.  Purpose of the Encounter: CODE STATUS  Parties in Attendance: Patient herself  Patient's Decision Capacity: Intact  Subjective/Patient's story:  Patient is a 73 year old being admitted with metastatic cancer type unknown who is being admitted with severe dehydration hypokalemia hypercalcemia and elevated uric acid level.  Objective/Medical story I discussed with the patient regarding her desires for cardiac and pulmonary resuscitation.   Goals of care determination:  Patient states that for now she wants to be a full code but states that once diagnosis established she will think about her CODE STATUS.   CODE STATUS: Full code for now   Time spent discussing advanced care planning: 16 minutes

## 2018-12-17 NOTE — ED Triage Notes (Signed)
Pt presents to ER after receiving a call from PCP for abnormal lab work, elevated calcium and uric acid, pt also reports also today had a CT and found a mass in left lung, pt reports feeling tired more than usual, some shortness of breath with activity,  left hip pain and dry cough. Pt talks in complete sentences no distress noted

## 2018-12-17 NOTE — ED Notes (Signed)
Pt states she cannot provide a urine sample at this time, advised to call when she needs to go

## 2018-12-17 NOTE — ED Notes (Signed)
Dr. Kerman Passey informed of critical K

## 2018-12-17 NOTE — ED Notes (Signed)
Report given to Noel, RN 

## 2018-12-17 NOTE — Progress Notes (Signed)
Patient seen and examined agree with assessment and plan formulated by Gardiner Barefoot nurse practitioner  Patient 73 year old who started having pain in her sacral region and hip pain for the past few months.  She was seen by orthopedics few weeks ago and then subsequently had an MRI of the lumbar spine without contrast showed infiltrating process in the right sacrum involving multiple right sacral ligaments.  Patient subsequently had a CT scan of the chest abdomen pelvis shows lung mass with likely metastatic disease.  Patient is noticed to have significant hypokalemia hypocalcemia elevated uric acid level consistent with possible tumor lysis syndrome.  She is recommended to be admitted.  Oncology consult will be obtained.  Patient will be treated with aggressive IV fluids.  Patient will also need IV bisphosphonate.   Total time spent 15 minutes

## 2018-12-18 ENCOUNTER — Encounter: Payer: Self-pay | Admitting: *Deleted

## 2018-12-18 DIAGNOSIS — Z86711 Personal history of pulmonary embolism: Secondary | ICD-10-CM | POA: Diagnosis not present

## 2018-12-18 DIAGNOSIS — C7931 Secondary malignant neoplasm of brain: Secondary | ICD-10-CM | POA: Diagnosis present

## 2018-12-18 DIAGNOSIS — Z87891 Personal history of nicotine dependence: Secondary | ICD-10-CM | POA: Diagnosis not present

## 2018-12-18 DIAGNOSIS — C771 Secondary and unspecified malignant neoplasm of intrathoracic lymph nodes: Secondary | ICD-10-CM | POA: Diagnosis present

## 2018-12-18 DIAGNOSIS — R918 Other nonspecific abnormal finding of lung field: Secondary | ICD-10-CM | POA: Diagnosis present

## 2018-12-18 DIAGNOSIS — J9600 Acute respiratory failure, unspecified whether with hypoxia or hypercapnia: Secondary | ICD-10-CM | POA: Diagnosis present

## 2018-12-18 DIAGNOSIS — M5442 Lumbago with sciatica, left side: Secondary | ICD-10-CM | POA: Diagnosis present

## 2018-12-18 DIAGNOSIS — C7951 Secondary malignant neoplasm of bone: Secondary | ICD-10-CM | POA: Diagnosis present

## 2018-12-18 DIAGNOSIS — E871 Hypo-osmolality and hyponatremia: Secondary | ICD-10-CM | POA: Diagnosis present

## 2018-12-18 DIAGNOSIS — C799 Secondary malignant neoplasm of unspecified site: Secondary | ICD-10-CM | POA: Diagnosis not present

## 2018-12-18 DIAGNOSIS — G629 Polyneuropathy, unspecified: Secondary | ICD-10-CM | POA: Diagnosis present

## 2018-12-18 DIAGNOSIS — E790 Hyperuricemia without signs of inflammatory arthritis and tophaceous disease: Secondary | ICD-10-CM | POA: Diagnosis present

## 2018-12-18 DIAGNOSIS — M5441 Lumbago with sciatica, right side: Secondary | ICD-10-CM | POA: Diagnosis present

## 2018-12-18 DIAGNOSIS — E785 Hyperlipidemia, unspecified: Secondary | ICD-10-CM | POA: Diagnosis present

## 2018-12-18 DIAGNOSIS — C3491 Malignant neoplasm of unspecified part of right bronchus or lung: Secondary | ICD-10-CM | POA: Diagnosis not present

## 2018-12-18 DIAGNOSIS — J9601 Acute respiratory failure with hypoxia: Secondary | ICD-10-CM | POA: Diagnosis present

## 2018-12-18 DIAGNOSIS — I82812 Embolism and thrombosis of superficial veins of left lower extremities: Secondary | ICD-10-CM | POA: Diagnosis present

## 2018-12-18 DIAGNOSIS — Z20828 Contact with and (suspected) exposure to other viral communicable diseases: Secondary | ICD-10-CM | POA: Diagnosis present

## 2018-12-18 DIAGNOSIS — E860 Dehydration: Secondary | ICD-10-CM | POA: Diagnosis present

## 2018-12-18 DIAGNOSIS — I82442 Acute embolism and thrombosis of left tibial vein: Secondary | ICD-10-CM | POA: Diagnosis present

## 2018-12-18 DIAGNOSIS — I10 Essential (primary) hypertension: Secondary | ICD-10-CM | POA: Diagnosis present

## 2018-12-18 DIAGNOSIS — J189 Pneumonia, unspecified organism: Secondary | ICD-10-CM | POA: Diagnosis present

## 2018-12-18 DIAGNOSIS — J90 Pleural effusion, not elsewhere classified: Secondary | ICD-10-CM | POA: Diagnosis present

## 2018-12-18 DIAGNOSIS — C3431 Malignant neoplasm of lower lobe, right bronchus or lung: Secondary | ICD-10-CM | POA: Diagnosis present

## 2018-12-18 DIAGNOSIS — I2699 Other pulmonary embolism without acute cor pulmonale: Secondary | ICD-10-CM | POA: Diagnosis present

## 2018-12-18 DIAGNOSIS — Z515 Encounter for palliative care: Secondary | ICD-10-CM | POA: Diagnosis not present

## 2018-12-18 DIAGNOSIS — R0902 Hypoxemia: Secondary | ICD-10-CM | POA: Diagnosis not present

## 2018-12-18 DIAGNOSIS — C787 Secondary malignant neoplasm of liver and intrahepatic bile duct: Secondary | ICD-10-CM | POA: Diagnosis present

## 2018-12-18 DIAGNOSIS — E876 Hypokalemia: Secondary | ICD-10-CM | POA: Diagnosis present

## 2018-12-18 DIAGNOSIS — F419 Anxiety disorder, unspecified: Secondary | ICD-10-CM | POA: Diagnosis present

## 2018-12-18 LAB — CBC
HCT: 39.9 % (ref 36.0–46.0)
Hemoglobin: 13.4 g/dL (ref 12.0–15.0)
MCH: 30.8 pg (ref 26.0–34.0)
MCHC: 33.6 g/dL (ref 30.0–36.0)
MCV: 91.7 fL (ref 80.0–100.0)
Platelets: 227 10*3/uL (ref 150–400)
RBC: 4.35 MIL/uL (ref 3.87–5.11)
RDW: 13.1 % (ref 11.5–15.5)
WBC: 14.2 10*3/uL — ABNORMAL HIGH (ref 4.0–10.5)
nRBC: 0 % (ref 0.0–0.2)

## 2018-12-18 LAB — URINALYSIS, COMPLETE (UACMP) WITH MICROSCOPIC
Bacteria, UA: NONE SEEN
Bilirubin Urine: NEGATIVE
Glucose, UA: NEGATIVE mg/dL
Hgb urine dipstick: NEGATIVE
Ketones, ur: 5 mg/dL — AB
Leukocytes,Ua: NEGATIVE
Nitrite: NEGATIVE
Protein, ur: NEGATIVE mg/dL
Specific Gravity, Urine: >1.046 — ABNORMAL HIGH (ref 1.005–1.030)
pH: 5 (ref 5.0–8.0)

## 2018-12-18 LAB — BASIC METABOLIC PANEL
Anion gap: 11 (ref 5–15)
BUN: 22 mg/dL (ref 8–23)
CO2: 24 mmol/L (ref 22–32)
Calcium: 12.1 mg/dL — ABNORMAL HIGH (ref 8.9–10.3)
Chloride: 103 mmol/L (ref 98–111)
Creatinine, Ser: 0.6 mg/dL (ref 0.44–1.00)
GFR calc Af Amer: >60 mL/min (ref >60)
GFR calc non Af Amer: >60 mL/min (ref >60)
Glucose, Bld: 106 mg/dL — ABNORMAL HIGH (ref 70–99)
Potassium: 3.1 mmol/L — ABNORMAL LOW (ref 3.5–5.1)
Sodium: 138 mmol/L (ref 135–145)

## 2018-12-18 LAB — PROTIME-INR
INR: 1 (ref 0.8–1.2)
Prothrombin Time: 13.3 seconds (ref 11.4–15.2)

## 2018-12-18 MED ORDER — ACETAMINOPHEN 650 MG RE SUPP: 650.0000 mg | Freq: Four times a day (QID) | RECTAL | Status: DC | PRN

## 2018-12-18 MED ORDER — FUROSEMIDE 10 MG/ML IJ SOLN
40.0000 mg | Freq: Every day | INTRAMUSCULAR | Status: AC
  Administered 2018-12-18 – 2018-12-20 (×3): 40 mg via INTRAVENOUS
  Filled 2018-12-18 (×3): qty 4

## 2018-12-18 MED ORDER — GUAIFENESIN-DM 100-10 MG/5ML PO SYRP
5.0000 mL | ORAL_SOLUTION | ORAL | Status: DC | PRN
  Administered 2018-12-18 – 2018-12-25 (×5): 5 mL via ORAL
  Filled 2018-12-18 (×5): qty 5

## 2018-12-18 MED ORDER — GABAPENTIN 300 MG PO CAPS
300.0000 mg | ORAL_CAPSULE | Freq: Three times a day (TID) | ORAL | Status: DC
  Administered 2018-12-18 (×2): 300 mg via ORAL
  Filled 2018-12-18 (×2): qty 1

## 2018-12-18 MED ORDER — GABAPENTIN 300 MG PO CAPS
300.0000 mg | ORAL_CAPSULE | Freq: Two times a day (BID) | ORAL | Status: DC
  Administered 2018-12-18 – 2018-12-21 (×6): 300 mg via ORAL
  Filled 2018-12-18 (×3): qty 1
  Filled 2018-12-18: qty 3
  Filled 2018-12-18 (×2): qty 1

## 2018-12-18 MED ORDER — LORATADINE 10 MG PO TABS
10.0000 mg | ORAL_TABLET | Freq: Every day | ORAL | Status: DC
  Administered 2018-12-18 – 2018-12-26 (×9): 10 mg via ORAL
  Filled 2018-12-18 (×9): qty 1

## 2018-12-18 MED ORDER — ALLOPURINOL 100 MG PO TABS
300.0000 mg | ORAL_TABLET | Freq: Every day | ORAL | Status: DC
  Administered 2018-12-18 – 2018-12-26 (×9): 300 mg via ORAL
  Filled 2018-12-18 (×9): qty 3

## 2018-12-18 MED ORDER — ALBUTEROL SULFATE (2.5 MG/3ML) 0.083% IN NEBU: 2.5000 mg | INHALATION_SOLUTION | Freq: Four times a day (QID) | RESPIRATORY_TRACT | Status: DC | PRN

## 2018-12-18 MED ORDER — SODIUM CHLORIDE 0.9 % IV SOLN
250.0000 mL | INTRAVENOUS | Status: DC | PRN
  Administered 2018-12-18 – 2018-12-24 (×3): 250 mL via INTRAVENOUS

## 2018-12-18 MED ORDER — METOPROLOL SUCCINATE ER 25 MG PO TB24
25.0000 mg | ORAL_TABLET | Freq: Every day | ORAL | Status: DC
  Administered 2018-12-18 – 2018-12-22 (×5): 25 mg via ORAL
  Filled 2018-12-18 (×5): qty 1

## 2018-12-18 MED ORDER — ENOXAPARIN SODIUM 40 MG/0.4ML ~~LOC~~ SOLN
40.0000 mg | SUBCUTANEOUS | Status: DC
  Administered 2018-12-18 – 2018-12-19 (×2): 40 mg via SUBCUTANEOUS
  Filled 2018-12-18 (×2): qty 0.4

## 2018-12-18 MED ORDER — ZOLEDRONIC ACID 4 MG/5ML IV CONC
4.0000 mg | Freq: Once | INTRAVENOUS | Status: AC
  Administered 2018-12-18: 18:00:00 4 mg via INTRAVENOUS
  Filled 2018-12-18: qty 5

## 2018-12-18 MED ORDER — POLYETHYLENE GLYCOL 3350 17 G PO PACK: 17.0000 g | PACK | Freq: Every day | ORAL | Status: DC | PRN

## 2018-12-18 MED ORDER — SODIUM CHLORIDE 0.9% FLUSH
3.0000 mL | INTRAVENOUS | Status: DC | PRN
  Administered 2018-12-23: 3 mL via INTRAVENOUS
  Filled 2018-12-18: qty 3

## 2018-12-18 MED ORDER — ONDANSETRON HCL 4 MG PO TABS: 4.0000 mg | ORAL_TABLET | Freq: Four times a day (QID) | ORAL | Status: DC | PRN

## 2018-12-18 MED ORDER — FOLIC ACID 1 MG PO TABS
1.0000 mg | ORAL_TABLET | Freq: Every day | ORAL | Status: DC
  Administered 2018-12-18 – 2018-12-26 (×9): 1 mg via ORAL
  Filled 2018-12-18 (×9): qty 1

## 2018-12-18 MED ORDER — HYDROCHLOROTHIAZIDE 12.5 MG PO CAPS: 12.5000 mg | ORAL_CAPSULE | Freq: Every day | ORAL | Status: DC

## 2018-12-18 MED ORDER — SODIUM CHLORIDE 0.9 % IV SOLN
6.0000 mg | Freq: Once | INTRAVENOUS | Status: AC
  Administered 2018-12-18: 6 mg via INTRAVENOUS
  Filled 2018-12-18: qty 4

## 2018-12-18 MED ORDER — ALBUMIN HUMAN 25 % IV SOLN
12.5000 g | Freq: Once | INTRAVENOUS | Status: AC
  Administered 2018-12-18: 12.5 g via INTRAVENOUS
  Filled 2018-12-18: qty 50

## 2018-12-18 MED ORDER — OXYCODONE HCL 5 MG PO TABS
5.0000 mg | ORAL_TABLET | Freq: Three times a day (TID) | ORAL | Status: DC | PRN
  Administered 2018-12-19 – 2018-12-21 (×4): 5 mg via ORAL
  Filled 2018-12-18 (×4): qty 1

## 2018-12-18 MED ORDER — ONDANSETRON HCL 4 MG/2ML IJ SOLN: 4.0000 mg | Freq: Four times a day (QID) | INTRAMUSCULAR | Status: DC | PRN

## 2018-12-18 MED ORDER — POTASSIUM CHLORIDE 10 MEQ/100ML IV SOLN
10.0000 meq | INTRAVENOUS | Status: AC
  Administered 2018-12-18 (×2): 10 meq via INTRAVENOUS
  Filled 2018-12-18 (×2): qty 100

## 2018-12-18 MED ORDER — ORAL CARE MOUTH RINSE
15.0000 mL | Freq: Two times a day (BID) | OROMUCOSAL | Status: DC
  Administered 2018-12-18 – 2018-12-24 (×13): 15 mL via OROMUCOSAL

## 2018-12-18 MED ORDER — FLUTICASONE PROPIONATE 50 MCG/ACT NA SUSP
2.0000 | Freq: Every day | NASAL | Status: DC
  Administered 2018-12-18 – 2018-12-25 (×7): 2 via NASAL
  Filled 2018-12-18 (×2): qty 16

## 2018-12-18 MED ORDER — ALBUTEROL SULFATE (2.5 MG/3ML) 0.083% IN NEBU
2.5000 mg | INHALATION_SOLUTION | Freq: Four times a day (QID) | RESPIRATORY_TRACT | Status: DC
  Administered 2018-12-18 (×2): 2.5 mg via RESPIRATORY_TRACT
  Filled 2018-12-18 (×2): qty 3

## 2018-12-18 MED ORDER — SODIUM CHLORIDE 0.9% FLUSH
3.0000 mL | Freq: Two times a day (BID) | INTRAVENOUS | Status: DC
  Administered 2018-12-19 – 2018-12-26 (×11): 3 mL via INTRAVENOUS

## 2018-12-18 MED ORDER — MONTELUKAST SODIUM 10 MG PO TABS
10.0000 mg | ORAL_TABLET | Freq: Every day | ORAL | Status: DC
  Administered 2018-12-18 – 2018-12-26 (×9): 10 mg via ORAL
  Filled 2018-12-18 (×10): qty 1

## 2018-12-18 MED ORDER — ACETAMINOPHEN 325 MG PO TABS
650.0000 mg | ORAL_TABLET | Freq: Four times a day (QID) | ORAL | Status: DC | PRN
  Administered 2018-12-20: 04:00:00 650 mg via ORAL
  Filled 2018-12-18: qty 2

## 2018-12-18 MED ORDER — IPRATROPIUM-ALBUTEROL 0.5-2.5 (3) MG/3ML IN SOLN: 3.0000 mL | Freq: Four times a day (QID) | RESPIRATORY_TRACT | Status: DC | PRN

## 2018-12-18 NOTE — Consult Note (Signed)
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  12/02/2018 MRI lumbar spine without contrast showed expansile infiltrating process in the right sacrum involving multiple right sacrum segments.  Tumor also infiltrates right posterior iliac bone.  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  Right L5 and S1 foraminal encroachment due to tumor extension into soft tissue.  Small lesion in T12 also supportive of metastatic disease.  Patient was noted to have abnormal serology significant for hypercalcemia hyperuricemia and elevated LDH levels with a decrement in her GFR and mild transaminitis.  CT chest independently reviewed by me shows a right lung mass along with well-circumscribed randomly placed solid nodules suggestive of metastatic disease and interlobular septal thickening suggestive of lymphangitic spread of malignancy, there is also a mild to moderate right pleural effusion as well as mediastinal/hilar lymphadenopathy all pointing towards active malignancy.  I did bedside ultrasound assessment of pleural cavity with right side showing small pleural effusion, she had lovenox this am and I think risk is higher then benefit at this time to perform thoracentesis.   PAST MEDICAL HISTORY   Past Medical History:  Diagnosis Date   Allergy    Cancer ([place]) 09/2014   Skin Cancer   Hyperlipidemia    Hypertension      SURGICAL HISTORY    Past Surgical History:  Procedure Laterality Date   ABDOMINAL HYSTERECTOMY  1989   due to menorrhagia   COLONOSCOPY     DILATION AND CURETTAGE OF UTERUS  1977   SKIN CANCER EXCISION  10/2014   on chest; Dr. Evorn Gong   TRIGGER FINGER RELEASE Right 2000   WRIST ARTHROPLASTY Left 1991     FAMILY HISTORY   Family History  Problem Relation Age of Onset   Arthritis Mother    Cerebrovascular Disease Mother    Heart disease Mother    Vascular Disease Mother    Hypertension Father    COPD Father    Throat cancer Father    Melanoma Father    Prostate cancer Father    Skin cancer Brother    Skin cancer Brother    Breast cancer Paternal Aunt 27     SOCIAL HISTORY   Social History   Tobacco Use   Smoking status: Former Smoker    Packs/day: 0.50    Years: 35.00    Pack years: 17.50    Quit date: 05/20/2003    Years since quitting: 15.5   Smokeless tobacco: Never Used  Substance Use Topics   Alcohol use: Yes    Alcohol/week: 14.0 standard drinks    Types: 14 Glasses of wine per week    Comment: occasional; 12/16/18 - states no alcohol since 12/02/18    Drug use: No     MEDICATIONS    Home Medication:    Current Medication:  Current Facility-Administered Medications:  0.9 %  sodium chloride infusion, 250 mL, Intravenous, PRN, Seals, Theo Dills, NP, Stopped at 12/18/18 5093   acetaminophen (TYLENOL) tablet 650 mg, 650 mg, Oral, Q6H PRN **OR** acetaminophen (TYLENOL) suppository 650 mg, 650 mg, Rectal, Q6H PRN, Seals, Angela H, NP   albuterol (PROVENTIL) (2.5 MG/3ML) 0.083% nebulizer solution 2.5 mg, 2.5 mg, Inhalation, Q6H PRN, Seals, Angela H, NP   enoxaparin (LOVENOX) injection 40 mg, 40 mg, Subcutaneous, Q24H, Seals, Angela H, NP, 40 mg at 12/18/18 0603   fluticasone (FLONASE) 50 MCG/ACT nasal spray 2 spray, 2 spray, Each Nare, Daily, Seals, Theo Dills, NP   folic acid (FOLVITE) tablet 1 mg, 1 mg, Oral, Daily, Seals, Angela H, NP    gabapentin (NEURONTIN) capsule 300 mg, 300 mg, Oral, TID, Seals, Angela H, NP   ipratropium-albuterol (DUONEB) 0.5-2.5 (3) MG/3ML nebulizer solution 3 mL, 3 mL, Nebulization, Q6H PRN, Seals, Angela H, NP   loratadine (CLARITIN) tablet 10 mg, 10 mg, Oral, Daily, Seals, Angela H, NP   MEDLINE mouth rinse, 15 mL, Mouth Rinse, BID, Seals, Theo Dills, NP   metoprolol succinate (TOPROL-XL) 24 hr tablet 25 mg, 25 mg, Oral, Daily, Seals, Angela H, NP   montelukast (SINGULAIR) tablet 10 mg, 10 mg, Oral, Daily, Seals, Angela H, NP   ondansetron (ZOFRAN) tablet 4 mg, 4 mg, Oral, Q6H PRN **OR** ondansetron (ZOFRAN) injection 4 mg, 4 mg, Intravenous, Q6H PRN, Seals, Angela H, NP   polyethylene glycol (MIRALAX / GLYCOLAX) packet 17 g, 17 g, Oral, Daily PRN, Seals, Angela H, NP   potassium chloride (KLOR-CON) packet 20 mEq, 20 mEq, Oral, BID, Seals, Angela H, NP, 20 mEq at 12/18/18 0215   potassium chloride 10 mEq in 100 mL IVPB, 10 mEq, Intravenous, Q1 Hr x 2, Seals, Angela H, NP, Last Rate: 100 mL/hr at 12/18/18 0709, 10 mEq at 12/18/18 0709   sodium chloride flush (NS) 0.9 % injection 3 mL, 3 mL, Intravenous, Q12H, Seals, Angela H, NP   sodium chloride flush (NS) 0.9 % injection 3 mL, 3 mL, Intravenous, PRN, Seals, Theo Dills, NP    ALLERGIES   Furadantin  [nitrofurantoin] and Phenobarbital     REVIEW OF SYSTEMS    Review of Systems:  Gen:  Denies  fever, sweats, chills weigh loss  HEENT: Denies blurred vision, double vision, ear pain, eye pain, hearing loss, nose bleeds, sore throat Cardiac:  No dizziness, chest pain or heaviness, chest tightness,edema Resp:   Denies cough or sputum porduction, shortness of breath,wheezing, hemoptysis,  Gi: Denies swallowing difficulty, stomach pain, nausea or vomiting, diarrhea, constipation, bowel incontinence Gu:  Denies bladder incontinence, burning urine Ext:   Denies Joint pain, stiffness or swelling Skin: Denies  skin rash, easy bruising or  bleeding or hives Endoc:  Denies polyuria, polydipsia , polyphagia or weight change Psych:   Denies depression, insomnia or hallucinations   Other:  All other systems negative   VS: BP (!) 113/96 (BP Location: Right Arm)    Pulse (!) 103    Temp 98.6 F (37 C) (Oral)    Resp 16    Ht 5\' 6"  (1.676 m)    Wt 86.3 kg    SpO2 94%    BMI 30.70 kg/m      PHYSICAL EXAM    GENERAL:NAD, no fevers, chills, no weakness no fatigue HEAD: Normocephalic, atraumatic.  EYES: Pupils equal, round, reactive to light. Extraocular muscles intact. No scleral icterus.  MOUTH: Moist mucosal membrane. Dentition intact. No abscess noted.  EAR, NOSE,  THROAT: Clear without exudates. No external lesions.  NECK: Supple. No thyromegaly. No nodules. No JVD.  PULMONARY: decreased breath sounds on right.  CARDIOVASCULAR: S1 and S2. Regular rate and rhythm. No murmurs, rubs, or gallops. No edema. Pedal pulses 2+ bilaterally.  GASTROINTESTINAL: Soft, nontender, nondistended. No masses. Positive bowel sounds. No hepatosplenomegaly.  MUSCULOSKELETAL: No swelling, clubbing, or edema. Range of motion full in all extremities.  NEUROLOGIC: Cranial nerves II through XII are intact. No gross focal neurological deficits. Sensation intact. Reflexes intact.  SKIN: No ulceration, lesions, rashes, or cyanosis. Skin warm and dry. Turgor intact.  PSYCHIATRIC: Mood, affect within normal limits. The patient is awake, alert and oriented x 3. Insight, judgment intact.       IMAGING    Dg Chest 2 View  Result Date: 12/17/2018 CLINICAL DATA:  History of cancer. Former smoker. Shortness of breath. EXAM: CHEST - 2 VIEW COMPARISON:  03/20/2016.  06/28/2012. FINDINGS: Right hilar fullness. Diffuse bilateral interstitial prominence. Multiple bilateral pulmonary nodular densities. These findings suggest malignancy with possible right hilar adenopathy, interstitial tumor spread, and metastatic disease. An inflammatory or infectious process  including active granulomatous disease could also present in this fashion. Contrast-enhanced chest CT should be considered for further evaluation. No pleural effusion or pneumothorax. Heart size normal. Thoracic spine scoliosis. IMPRESSION: Right hilar fullness, diffuse bilateral interstitial prominence, and multiple bilateral pulmonary nodular densities. These findings suggest malignancy with possible right hilar adenopathy, interstitial tumor spread, and metastatic disease. An inflammatory or infectious process including active granulomatous disease could also present this fashion. Contrast-enhanced chest CT should be considered for further evaluation. Electronically Signed   By: Marcello Moores  Register   On: 12/17/2018 08:14   Ct Chest W Contrast  Result Date: 12/17/2018 CLINICAL DATA:  Follow-up exam. History of metastasis and lung nodules. EXAM: CT CHEST, ABDOMEN, AND PELVIS WITH CONTRAST TECHNIQUE: Multidetector CT imaging of the chest, abdomen and pelvis was performed following the standard protocol during bolus administration of intravenous contrast. CONTRAST:  159mL OMNIPAQUE IOHEXOL 300 MG/ML  SOLN COMPARISON:  Chest radiograph 12/16/2018; MRI lumbar spine 12/02/2018 FINDINGS: CT CHEST FINDINGS Cardiovascular: Normal heart size. Trace fluid superior pericardial recess. Thoracic aortic vascular calcifications. Mediastinum/Nodes: No axillary lymphadenopathy. There is a 1.9 cm centrally necrotic precarinal node (image 22; series 2). There is a 1.7 cm right hilar node (image 27; series 2). There is a 1.9 cm left hilar node (image 30; series 2). There is a 1.2 cm centrally necrotic nodule along the posterior left pericardium (image 38; series 2). Mild wall thickening of the esophagus. Lungs/Pleura: Central airways are patent. There is a dominant centrally necrotic 6.2 x 5.9 cm mass within the right lower lobe (image 68; series 4). There is a 2.9 cm subpleural left lower lobe nodule (image 138; series 4). There are  multiple bilateral pulmonary nodules. Reference 1.7 cm left lower lobe nodule (image 115; series 4). Reference 0.8 cm left upper lobe nodule (image 42; series 4). Reference 1.5 cm right middle lobe nodule (image 66; series 4). 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 There is a moderate right pleural effusion. Musculoskeletal: No aggressive or acute appearing osseous lesions. CT ABDOMEN PELVIS FINDINGS Hepatobiliary: There are innumerable low-attenuation lesions throughout the left and right hepatic lobes, the majority are subcentimeter in size. Gallbladder is unremarkable. No intrahepatic or extrahepatic biliary ductal  dilatation. Pancreas: Unremarkable Spleen: Unremarkable Adrenals/Urinary Tract: Normal adrenal glands. Kidneys enhance symmetrically with contrast. Subcentimeter too small to characterize low-attenuation lesion inferior pole left kidney (image 77; series 2). Urinary bladder is decompressed. Stomach/Bowel: Stool throughout the colon. No abnormal bowel wall thickening or evidence for bowel obstruction. No free fluid or free intraperitoneal air. Normal morphology of the stomach. Vascular/Lymphatic: Normal caliber abdominal aorta. Peripheral calcified atherosclerotic plaque. 1.3 cm left periaortic lymph node (image 71; series 2). 1.1 cm aortocaval node (image 80; series 2). There is a 1.0 cm porta hepatic node (image 65; series 2). Reproductive: Prior hysterectomy. Adnexal structures are unremarkable. Other: None. Musculoskeletal: Lumbar spine degenerative changes. There is a 6.7 x 5.7 cm lytic soft tissue mass involving the right hemi sacrum (image 93; series 2) with soft tissue involving the sacral spinal canal. There is a 2.4 cm soft tissue lytic lesion within the posterior right ilium (image 92; series 2).  Additional lytic lesions within the right ilium. IMPRESSION: 1. Large centrally necrotic right lower lobe mass concerning the possibility of primary pulmonary malignancy. 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 2. Multiple enlarged and necrotic mediastinal and hilar nodes most compatible with nodal metastasis. 3. Enlarged retroperitoneal nodes compatible with metastasis. 4. Innumerable low-attenuation lesions within liver compatible metastasis. 5. Large lytic mass involving the right hemi sacrum as well as smaller lytic lesions involving the right ilium consistent with osseous metastasis. Electronically Signed   By: Lovey Newcomer M.D.   On: 12/17/2018 16:47   Mr Lumbar Spine Wo Contrast  Result Date: 12/03/2018 CLINICAL DATA:  Sciatica right side. Right back and leg pain 4-5 months. EXAM: MRI LUMBAR SPINE WITHOUT CONTRAST TECHNIQUE: Multiplanar, multisequence MR imaging of the lumbar spine was performed. No intravenous contrast was administered. COMPARISON:  None. FINDINGS: Segmentation:  Normal Alignment:  4 mm anterolisthesis L4-5.  Mild dextroscoliosis. Vertebrae: 1 cm hyperintense lesion in the left posterior T12 vertebral body best seen on inversion recovery. Infiltrating mass lesion in the right sacrum involving the right sacrum. Tumor involves the right S1 vertebral body and pedicle and extends into the right S2 vertebral body. Tumor is low signal on T1 and hyperintense on T2. Extraosseous tumor with narrowing the foramen at L5-S1 and S1-2. Tumor also infiltrates the posterior iliac bone adjacent to the SI joint. Small right SI joint effusion. Conus medullaris and cauda equina: Conus extends to the L1-2 level. Conus and cauda equina appear normal. Paraspinal and other soft tissues: Negative for retroperitoneal adenopathy. Disc levels: L1-2: Mild disc and  facet degeneration without stenosis L2-3: Moderate disc degeneration with disc bulging and spurring. Bilateral facet hypertrophy and mild spinal stenosis. L3-4: Disc degeneration and spondylosis. Bilateral facet hypertrophy. Moderate spinal stenosis. Mild to moderate subarticular stenosis bilaterally L4-5: 4 mm anterolisthesis. Diffuse bulging of the disc with moderate to severe facet degeneration. Moderate to severe spinal stenosis. Mild subarticular stenosis bilaterally L5-S1: Bilateral facet hypertrophy. Right foraminal encroachment due to tumor extension and spurring. 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 Tumor also infiltrates right posterior iliac bone. 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 Right L5 and S1 foraminal encroachment due to tumor extension into the soft tissues. Small lesion T12 also supportive of metastatic disease. Chondrosarcoma or lymphoma right sacrum less likely. These results were called by telephone at the time of interpretation on 12/03/2018 at 10:27 am to  Cameron Proud PAa , who verbally acknowledged these results. Electronically Signed   By: Franchot Gallo M.D.   On: 12/03/2018 10:36   Ct Abdomen Pelvis W Contrast  Result Date: 12/17/2018 CLINICAL DATA:  Follow-up exam. History of metastasis and lung nodules. EXAM: CT CHEST, ABDOMEN, AND PELVIS WITH CONTRAST TECHNIQUE: Multidetector CT imaging of the chest, abdomen and pelvis was performed following the standard protocol during bolus administration of intravenous contrast. CONTRAST:  148mL OMNIPAQUE IOHEXOL 300 MG/ML  SOLN COMPARISON:  Chest radiograph 12/16/2018; MRI lumbar spine 12/02/2018 FINDINGS: CT CHEST FINDINGS Cardiovascular: Normal heart size. Trace fluid superior pericardial recess. Thoracic aortic vascular calcifications. Mediastinum/Nodes: No axillary lymphadenopathy. There is a 1.9 cm centrally necrotic precarinal node (image 22;  series 2). There is a 1.7 cm right hilar node (image 27; series 2). There is a 1.9 cm left hilar node (image 30; series 2). There is a 1.2 cm centrally necrotic nodule along the posterior left pericardium (image 38; series 2). Mild wall thickening of the esophagus. Lungs/Pleura: Central airways are patent. There is a dominant centrally necrotic 6.2 x 5.9 cm mass within the right lower lobe (image 68; series 4). There is a 2.9 cm subpleural left lower lobe nodule (image 138; series 4). There are multiple bilateral pulmonary nodules. Reference 1.7 cm left lower lobe nodule (image 115; series 4). Reference 0.8 cm left upper lobe nodule (image 42; series 4). Reference 1.5 cm right middle lobe nodule (image 66; series 4). 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 There is a moderate right pleural effusion. Musculoskeletal: No aggressive or acute appearing osseous lesions. CT ABDOMEN PELVIS FINDINGS Hepatobiliary: There are innumerable low-attenuation lesions throughout the left and right hepatic lobes, the majority are subcentimeter in size. Gallbladder is unremarkable. No intrahepatic or extrahepatic biliary ductal dilatation. Pancreas: Unremarkable Spleen: Unremarkable Adrenals/Urinary Tract: Normal adrenal glands. Kidneys enhance symmetrically with contrast. Subcentimeter too small to characterize low-attenuation lesion inferior pole left kidney (image 77; series 2). Urinary bladder is decompressed. Stomach/Bowel: Stool throughout the colon. No abnormal bowel wall thickening or evidence for bowel obstruction. No free fluid or free intraperitoneal air. Normal morphology of the stomach. Vascular/Lymphatic: Normal caliber abdominal aorta. Peripheral calcified atherosclerotic plaque. 1.3 cm left periaortic lymph node (image 71;  series 2). 1.1 cm aortocaval node (image 80; series 2). There is a 1.0 cm porta hepatic node (image 65; series 2). Reproductive: Prior hysterectomy. Adnexal structures are unremarkable. Other: None. Musculoskeletal: Lumbar spine degenerative changes. There is a 6.7 x 5.7 cm lytic soft tissue mass involving the right hemi sacrum (image 93; series 2) with soft tissue involving the sacral spinal canal. There is a 2.4 cm soft tissue lytic lesion within the posterior right ilium (image 92; series 2). Additional lytic lesions within the right ilium. IMPRESSION: 1. Large centrally necrotic right lower lobe mass concerning the possibility of primary pulmonary malignancy. 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 2. Multiple enlarged and necrotic mediastinal and hilar nodes most compatible with nodal metastasis. 3. Enlarged retroperitoneal nodes compatible with metastasis. 4. Innumerable low-attenuation lesions within liver compatible metastasis. 5. Large lytic mass involving the right hemi sacrum as well as smaller lytic lesions involving the right ilium consistent with osseous metastasis. Electronically Signed  By: Lovey Newcomer M.D.   On: 12/17/2018 16:47      ASSESSMENT/PLAN   Acute hypoxemic respiratory failure  - likely due to right lung mass with multiple nodules and lymphangitic spread complicated by right pleural effusion and mild compressive atelectasis  - Ultrasound asessment - small effusion unlikely to help with SOB and hypoxemia and may not be enough to give diagnosis for malignancy  - will diurese with lasix once daily to reduce effusion, will repeat uric acid as lasix tends to increase urate levels and in the interim will order Rasburicase X1 , no hx of G6PD deficiency  - will initiate aggressive bronchopulmonary hygiene to recruit atelectatic segments  in attempt to wean off of oxygen  - starting IS at bedside and MetaNeb q6h with saline   Right lower lobe 6cm lung mass with central necrosis  - 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   Patient/Family are satisfied with care plan and all questions have been answered.  This document was prepared using Dragon voice recognition software and may include unintentional dictation errors.     Ottie Glazier, M.D.  Division of [facility]

## 2018-12-18 NOTE — Consult Note (Signed)
Hematology/Oncology Consult note [facility] Telephone:([phone] [phone]  Patient Care Team: Rubye Beach as PCP - General (Family Medicine) Dasher, Rayvon Char, MD as Consulting Physician (Dermatology) Yolonda Kida, MD as Consulting Physician (Cardiology) Dingeldein, Remo Lipps, MD as Consulting Physician (Ophthalmology) Kipp Laurence, MD as Referring Physician (Audiology) Grant Fontana, Dawn as Referring Physician (Chiropractic Medicine) Watt Climes, PA as Physician Assistant (Physician Assistant)   Name of the patient: Elizabeth [name]  [id number]  [birth year]   Date of visit: 12/18/18 REASON FOR COSULTATION:  Lung mass  History of presenting illness-  73 y.o. 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 She complained shortness of breath and I obtained chest x-ray on 12/16/2018. Chest x-ray showed right hilar fullness, diffuse bilateral interstitial prominence and multiple bilateral pulmonary nodular densities.  Concerning for malignancy. Her outpatient labs was reviewed by me.  She has elevated LDH, transaminitis increased calcium level to 12.5, high uric acid level, and combination with chest x-ray finding, I ordered stat CT chest abdomen pelvis, which unfortunately showed a large centrally necrotic right lower lobe mass concerning for possibility of primary pulmonary malignancy.  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 She has multiple enlarged and necrotic mediastinal and hilar nodes most compatible with nodal metastasis. Enlarged retroperitoneal nodes  compatible with metastasis. Innumerable low attenuation liver lesions Large lytic mass involving right hemi-sacrum as well as small lytic lesion involving right ilium consistent with osseous metastasis. Patient was admitted. She was also evaluated by pulmonology Dr. Lanney Gins.  For hyper uric acidemia, rasburicase was ordered. Patient reports feeling drowsy and sleepy today, she went to emergency room per my instruction last evening, was not able to sleep well. Breathing comfortably via nasal cannula oxygen.  No new complaints.  Currently she does not have any lower back /hip pain. Her pain usually is triggered with position changes denies any headache or double vision.    Review of Systems  Constitutional: Positive for appetite change, fatigue and unexpected weight change. Negative for chills and fever.  HENT:   Negative for hearing loss and voice change.   Eyes: Negative for eye problems.  Respiratory: Positive for shortness of breath. Negative for chest tightness and cough.   Cardiovascular: Negative for chest pain.  Gastrointestinal: Negative for abdominal distention, abdominal pain and blood in stool.  Endocrine: Negative for hot flashes.  Genitourinary: Negative for difficulty urinating and frequency.   Musculoskeletal: Positive for back pain. Negative for arthralgias.  Skin: Negative for itching and rash.  Neurological: Negative for extremity weakness and headaches.  Hematological: Negative for adenopathy.  Psychiatric/Behavioral: Negative for confusion.       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 Evorn Gong   TRIGGER FINGER RELEASE Right 2000   WRIST ARTHROPLASTY Left 1991    Social History   Socioeconomic History   Marital status: Married    Spouse name: Eulas Post   Number of children: 2   Years of education: Not on file   Highest education level: Bachelor's degree (e.g., BA, AB, BS)  Occupational History   Occupation: retired  Scientist, product/process development strain: Not hard at [employer]    Worry: Never true    Inability: Never true   Transportation needs    Medical: No    Non-medical: No  Tobacco Use   Smoking status: Former Smoker    Packs/day: 0.50    Years: 35.00    Pack years: 17.50    Quit date: 05/20/2003    Years since quitting: 15.5   Smokeless tobacco: Never Used  Substance and Sexual Activity   Alcohol use: Yes    Alcohol/week: 14.0 standard drinks    Types: 14 Glasses of wine per week    Comment: occasional; 12/16/18 - states no alcohol since 12/02/18    Drug use: No   Sexual activity: Not on file  Lifestyle   Physical activity    Days per week: 0 days    Minutes per session: 0 min   Stress: Not at all  Relationships   Social connections    Talks on phone: Patient refused    Gets together: Patient refused    Attends religious service: Patient refused    Active member of club or organization: Patient refused    Attends meetings of clubs or organizations: Patient refused    Relationship status: Patient refused   Intimate partner violence    Fear of  current or ex partner: Patient refused    Emotionally abused: Patient refused    Physically abused: Patient refused    Forced sexual activity: Patient refused  Other Topics Concern   Not on file  Social History Narrative   Not on file     Family History  Problem Relation Age of Onset   Arthritis Mother    Cerebrovascular Disease Mother    Heart disease Mother    Vascular Disease Mother    Hypertension Father    COPD Father    Throat cancer Father    Melanoma Father    Prostate cancer Father    Skin cancer Brother    Skin cancer Brother    Breast cancer Paternal Aunt 70     Current Facility-Administered Medications:    0.9 %  sodium chloride infusion, 250 mL, Intravenous, PRN, Seals, Theo Dills, NP, Stopped at 12/18/18 2458   acetaminophen (TYLENOL) tablet 650 mg, 650 mg, Oral, Q6H PRN **OR** acetaminophen (TYLENOL) suppository 650 mg, 650 mg, Rectal, Q6H PRN, Seals, Angela H, NP   albuterol (PROVENTIL) (2.5 MG/3ML) 0.083% nebulizer solution 2.5 mg, 2.5 mg, Inhalation, Q6H PRN, Seals, Angela H, NP   albuterol (PROVENTIL) (2.5 MG/3ML) 0.083% nebulizer solution 2.5 mg, 2.5 mg, Nebulization, Q6H, Aleskerov, Fuad, MD, 2.5 mg at 12/18/18  1358   allopurinol (ZYLOPRIM) tablet 300 mg, 300 mg, Oral, Daily, Earlie Server, MD, 300 mg at 12/18/18 1550   enoxaparin (LOVENOX) injection 40 mg, 40 mg, Subcutaneous, Q24H, Seals, Angela H, NP, 40 mg at 12/18/18 0603   fluticasone (FLONASE) 50 MCG/ACT nasal spray 2 spray, 2 spray, Each Nare, Daily, Seals, Theo Dills, NP, 2 spray at 41/32/44 0102   folic acid (FOLVITE) tablet 1 mg, 1 mg, Oral, Daily, Seals, Angela H, NP, 1 mg at 12/18/18 7253   furosemide (LASIX) injection 40 mg, 40 mg, Intravenous, Daily, Ottie Glazier, MD, 40 mg at 12/18/18 1100   gabapentin (NEURONTIN) capsule 300 mg, 300 mg, Oral, TID, Seals, Angela H, NP, 300 mg at 12/18/18 1545   loratadine (CLARITIN) tablet 10 mg, 10 mg, Oral, Daily, Seals, Angela H, NP, 10  mg at 12/18/18 6644   MEDLINE mouth rinse, 15 mL, Mouth Rinse, BID, Seals, Theo Dills, NP, 15 mL at 12/18/18 0951   metoprolol succinate (TOPROL-XL) 24 hr tablet 25 mg, 25 mg, Oral, Daily, Seals, Angela H, NP, 25 mg at 12/18/18 0951   montelukast (SINGULAIR) tablet 10 mg, 10 mg, Oral, Daily, Seals, Angela H, NP, 10 mg at 12/18/18 0951   ondansetron (ZOFRAN) tablet 4 mg, 4 mg, Oral, Q6H PRN **OR** ondansetron (ZOFRAN) injection 4 mg, 4 mg, Intravenous, Q6H PRN, Seals, Angela H, NP   polyethylene glycol (MIRALAX / GLYCOLAX) packet 17 g, 17 g, Oral, Daily PRN, Seals, Angela H, NP   potassium chloride (KLOR-CON) packet 20 mEq, 20 mEq, Oral, BID, Seals, Angela H, NP, 20 mEq at 12/18/18 0951   sodium chloride flush (NS) 0.9 % injection 3 mL, 3 mL, Intravenous, Q12H, Seals, Angela H, NP   sodium chloride flush (NS) 0.9 % injection 3 mL, 3 mL, Intravenous, PRN, Mayer Camel, NP   Physical exam:  Vitals:   12/18/18 0238 12/18/18 0444 12/18/18 0504 12/18/18 1557  BP: (!) 145/87 (!) 141/75 (!) 113/96 108/71  Pulse: 100 (!) 102 (!) 103 (!) 114  Resp: 18 16    Temp:  98 F (36.7 C) 98.6 F (37 C) (!) 97.3 F (36.3 C)  TempSrc:  Oral Oral Oral  SpO2: 94% 94% 94% 94%  Weight:      Height:       Physical Exam  Constitutional: She is oriented to person, place, and time. No distress.  HENT:  Head: Normocephalic and atraumatic.  Eyes: Pupils are equal, round, and reactive to light.  Neck: Neck supple.  Cardiovascular: Normal rate and regular rhythm.  No murmur heard. Pulmonary/Chest: Effort normal.  Breathing comfortably via nasal cannula oxygen.   Abdominal: Soft. Bowel sounds are normal.  Musculoskeletal: Normal range of motion.        General: No edema.  Neurological: She is alert and oriented to person, place, and time.  Skin: Skin is warm and dry.  Psychiatric: Affect normal.        CMP Latest Ref Rng & Units 12/18/2018  Glucose 70 - 99 mg/dL 106(H)  BUN 8 - 23 mg/dL 22    Creatinine 0.44 - 1.00 mg/dL 0.60  Sodium 135 - 145 mmol/L 138  Potassium 3.5 - 5.1 mmol/L 3.1(L)  Chloride 98 - 111 mmol/L 103  CO2 22 - 32 mmol/L 24  Calcium 8.9 - 10.3 mg/dL 12.1(H)  Total Protein 6.5 - 8.1 g/dL -  Total Bilirubin 0.3 - 1.2 mg/dL -  Alkaline Phos 38 - 126 U/L -  AST 15 - 41 U/L -  ALT 0 - 44 U/L -   CBC Latest Ref Rng & Units 12/18/2018  WBC 4.0 - 10.5 K/uL 14.2(H)  Hemoglobin 12.0 - 15.0 g/dL 13.4  Hematocrit 36.0 - 46.0 % 39.9  Platelets 150 - 400 K/uL 227   RADIOGRAPHIC STUDIES: I have personally reviewed the radiological images as listed and agreed with the findings in the report. Dg Chest 2 View  Result Date: 12/17/2018 CLINICAL DATA:  History of cancer. Former smoker. Shortness of breath. EXAM: CHEST - 2 VIEW COMPARISON:  03/20/2016.  06/28/2012. FINDINGS: Right hilar fullness. Diffuse bilateral interstitial prominence. Multiple bilateral pulmonary nodular densities. These findings suggest malignancy with possible right hilar adenopathy, interstitial tumor spread, and metastatic disease. An inflammatory or infectious process including active granulomatous disease could also present in this fashion. Contrast-enhanced chest CT should be considered for further evaluation. No pleural effusion or pneumothorax. Heart size normal. Thoracic spine scoliosis. IMPRESSION: Right hilar fullness, diffuse bilateral interstitial prominence, and multiple bilateral pulmonary nodular densities. These findings suggest malignancy with possible right hilar adenopathy, interstitial tumor spread, and metastatic disease. An inflammatory or infectious process including active granulomatous disease could also present this fashion. Contrast-enhanced chest CT should be considered for further evaluation. Electronically Signed   By: Marcello Moores  Register   On: 12/17/2018 08:14   Ct Chest W Contrast  Result Date: 12/17/2018 CLINICAL DATA:  Follow-up exam. History of metastasis and lung nodules. EXAM:  CT CHEST, ABDOMEN, AND PELVIS WITH CONTRAST TECHNIQUE: Multidetector CT imaging of the chest, abdomen and pelvis was performed following the standard protocol during bolus administration of intravenous contrast. CONTRAST:  184mL OMNIPAQUE IOHEXOL 300 MG/ML  SOLN COMPARISON:  Chest radiograph 12/16/2018; MRI lumbar spine 12/02/2018 FINDINGS: CT CHEST FINDINGS Cardiovascular: Normal heart size. Trace fluid superior pericardial recess. Thoracic aortic vascular calcifications. Mediastinum/Nodes: No axillary lymphadenopathy. There is a 1.9 cm centrally necrotic precarinal node (image 22; series 2). There is a 1.7 cm right hilar node (image 27; series 2). There is a 1.9 cm left hilar node (image 30; series 2). There is a 1.2 cm centrally necrotic nodule along the posterior left pericardium (image 38; series 2). Mild wall thickening of the esophagus. Lungs/Pleura: Central airways are patent. There is a dominant centrally necrotic 6.2 x 5.9 cm mass within the right lower lobe (image 68; series 4). There is a 2.9 cm subpleural left lower lobe nodule (image 138; series 4). There are multiple bilateral pulmonary nodules. Reference 1.7 cm left lower lobe nodule (image 115; series 4). Reference 0.8 cm left upper lobe nodule (image 42; series 4). Reference 1.5 cm right middle lobe nodule (image 66; series 4). 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 There is a moderate right pleural effusion. Musculoskeletal: No aggressive or acute appearing osseous lesions. CT ABDOMEN PELVIS FINDINGS Hepatobiliary: There are innumerable low-attenuation lesions throughout the left and right hepatic lobes, the majority are subcentimeter in size. Gallbladder is unremarkable. No intrahepatic or extrahepatic biliary ductal dilatation. Pancreas: Unremarkable  Spleen: Unremarkable Adrenals/Urinary Tract: Normal adrenal glands. Kidneys enhance symmetrically with contrast. Subcentimeter too small to characterize low-attenuation lesion inferior pole left kidney (image 77; series 2). Urinary bladder is decompressed. Stomach/Bowel: Stool throughout the colon. No abnormal bowel wall thickening or evidence for bowel obstruction. No free fluid or free intraperitoneal air. Normal morphology of the stomach. Vascular/Lymphatic: Normal caliber abdominal aorta.  Peripheral calcified atherosclerotic plaque. 1.3 cm left periaortic lymph node (image 71; series 2). 1.1 cm aortocaval node (image 80; series 2). There is a 1.0 cm porta hepatic node (image 65; series 2). Reproductive: Prior hysterectomy. Adnexal structures are unremarkable. Other: None. Musculoskeletal: Lumbar spine degenerative changes. There is a 6.7 x 5.7 cm lytic soft tissue mass involving the right hemi sacrum (image 93; series 2) with soft tissue involving the sacral spinal canal. There is a 2.4 cm soft tissue lytic lesion within the posterior right ilium (image 92; series 2). Additional lytic lesions within the right ilium. IMPRESSION: 1. Large centrally necrotic right lower lobe mass concerning the possibility of primary pulmonary malignancy. 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 2. Multiple enlarged and necrotic mediastinal and hilar nodes most compatible with nodal metastasis. 3. Enlarged retroperitoneal nodes compatible with metastasis. 4. Innumerable low-attenuation lesions within liver compatible metastasis. 5. Large lytic mass involving the right hemi sacrum as well as smaller lytic lesions involving the right ilium consistent with osseous metastasis. Electronically Signed   By: Lovey Newcomer M.D.   On: 12/17/2018 16:47   Mr Lumbar Spine Wo Contrast  Result  Date: 12/03/2018 CLINICAL DATA:  Sciatica right side. Right back and leg pain 4-5 months. EXAM: MRI LUMBAR SPINE WITHOUT CONTRAST TECHNIQUE: Multiplanar, multisequence MR imaging of the lumbar spine was performed. No intravenous contrast was administered. COMPARISON:  None. FINDINGS: Segmentation:  Normal Alignment:  4 mm anterolisthesis L4-5.  Mild dextroscoliosis. Vertebrae: 1 cm hyperintense lesion in the left posterior T12 vertebral body best seen on inversion recovery. Infiltrating mass lesion in the right sacrum involving the right sacrum. Tumor involves the right S1 vertebral body and pedicle and extends into the right S2 vertebral body. Tumor is low signal on T1 and hyperintense on T2. Extraosseous tumor with narrowing the foramen at L5-S1 and S1-2. Tumor also infiltrates the posterior iliac bone adjacent to the SI joint. Small right SI joint effusion. Conus medullaris and cauda equina: Conus extends to the L1-2 level. Conus and cauda equina appear normal. Paraspinal and other soft tissues: Negative for retroperitoneal adenopathy. Disc levels: L1-2: Mild disc and facet degeneration without stenosis L2-3: Moderate disc degeneration with disc bulging and spurring. Bilateral facet hypertrophy and mild spinal stenosis. L3-4: Disc degeneration and spondylosis. Bilateral facet hypertrophy. Moderate spinal stenosis. Mild to moderate subarticular stenosis bilaterally L4-5: 4 mm anterolisthesis. Diffuse bulging of the disc with moderate to severe facet degeneration. Moderate to severe spinal stenosis. Mild subarticular stenosis bilaterally L5-S1: Bilateral facet hypertrophy. Right foraminal encroachment due to tumor extension and spurring. 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 Tumor also infiltrates right posterior iliac bone. 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 Right L5 and S1 foraminal encroachment due to tumor extension into  the soft tissues. Small lesion T12 also supportive of metastatic disease. Chondrosarcoma or lymphoma right sacrum less likely. These results were called by telephone at the time of interpretation on 12/03/2018 at 10:27 am to [facility] , who verbally acknowledged these results. Electronically Signed   By: Franchot Gallo M.D.   On: 12/03/2018 10:36   Ct Abdomen Pelvis W Contrast  Result Date: 12/17/2018 CLINICAL DATA:  Follow-up exam. History of metastasis and lung nodules. EXAM: CT CHEST, ABDOMEN, AND PELVIS WITH CONTRAST TECHNIQUE: Multidetector CT imaging of the chest, abdomen and pelvis was performed following  the standard protocol during bolus administration of intravenous contrast. CONTRAST:  183mL OMNIPAQUE IOHEXOL 300 MG/ML  SOLN COMPARISON:  Chest radiograph 12/16/2018; MRI lumbar spine 12/02/2018 FINDINGS: CT CHEST FINDINGS Cardiovascular: Normal heart size. Trace fluid superior pericardial recess. Thoracic aortic vascular calcifications. Mediastinum/Nodes: No axillary lymphadenopathy. There is a 1.9 cm centrally necrotic precarinal node (image 22; series 2). There is a 1.7 cm right hilar node (image 27; series 2). There is a 1.9 cm left hilar node (image 30; series 2). There is a 1.2 cm centrally necrotic nodule along the posterior left pericardium (image 38; series 2). Mild wall thickening of the esophagus. Lungs/Pleura: Central airways are patent. There is a dominant centrally necrotic 6.2 x 5.9 cm mass within the right lower lobe (image 68; series 4). There is a 2.9 cm subpleural left lower lobe nodule (image 138; series 4). There are multiple bilateral pulmonary nodules. Reference 1.7 cm left lower lobe nodule (image 115; series 4). Reference 0.8 cm left upper lobe nodule (image 42; series 4). Reference 1.5 cm right middle lobe nodule (image 66; series 4). 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 There is a moderate right pleural effusion. Musculoskeletal: No aggressive or acute appearing osseous lesions. CT ABDOMEN PELVIS FINDINGS Hepatobiliary: There are innumerable low-attenuation lesions throughout the left and right hepatic lobes, the majority are subcentimeter in size. Gallbladder is unremarkable. No intrahepatic or extrahepatic biliary ductal dilatation. Pancreas: Unremarkable Spleen: Unremarkable Adrenals/Urinary Tract: Normal adrenal glands. Kidneys enhance symmetrically with contrast. Subcentimeter too small to characterize low-attenuation lesion inferior pole left kidney (image 77; series 2). Urinary bladder is decompressed. Stomach/Bowel: Stool throughout the colon. No abnormal bowel wall thickening or evidence for bowel obstruction. No free fluid or free intraperitoneal air. Normal morphology of the stomach. Vascular/Lymphatic: Normal caliber abdominal aorta. Peripheral calcified atherosclerotic plaque. 1.3 cm left periaortic lymph node (image 71; series 2). 1.1 cm aortocaval node (image 80; series 2). There is a 1.0 cm porta hepatic node (image 65; series 2). Reproductive: Prior hysterectomy. Adnexal structures are unremarkable. Other: None. Musculoskeletal: Lumbar spine degenerative changes. There is a 6.7 x 5.7 cm lytic soft tissue mass involving the right hemi sacrum (image 93; series 2) with soft tissue involving the sacral spinal canal. There is a 2.4 cm soft tissue lytic lesion within the posterior right ilium (image 92; series 2). Additional lytic lesions within the right ilium. IMPRESSION: 1. Large centrally necrotic right lower lobe mass concerning the possibility of primary pulmonary malignancy. 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 2.  Multiple enlarged and necrotic mediastinal and hilar nodes most compatible with nodal metastasis. 3. Enlarged retroperitoneal nodes compatible with metastasis. 4. Innumerable low-attenuation lesions within liver compatible metastasis. 5. Large lytic mass involving the right hemi sacrum as well as smaller lytic lesions involving the right ilium consistent with osseous metastasis. Electronically Signed   By: Lovey Newcomer M.D.   On: 12/17/2018 16:47    Assessment and plan-   #Likely metastatic lung cancer with extensive lymphadenopathy, liver and bone involvement. CT images were independently reviewed by me and discussed with patient. Recommend obtaining tissue diagnosis, recommend IR liver biopsy.  #Acute hypoxic respiratory failure, likely due to lung mass, multiple nodules in the lymphangitic spread, right  pleural effusion and compression atelectasis.  She was seen and evaluated by pulmonology.  Note was reviewed.  Diuretic was added.  Metaneb every 6 hours.  #Hypercalcemia, likely secondary to malignancy. Calcium level improved with IV hydration.  Recommend Zometa 4 mg x 1. Rationale and potential side effects including but not limited to hypocalcemia, muscle cramps, bone pain, osteonecrosis were discussed with patient.  She voices understanding and willing to proceed with treatment.  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  She also had a decrease of kidney function potentially secondary to hyperuricemia.  Rasburicase x 1,  I added allopurinol 300 mg daily. Kidney function improved with IV hydration.  #Lower back hip pain, exacerbated with motion.  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 On gabapentin 300 mg BID. Add oxycodone 5 mg every 8 hours as needed. After biopsy, will consider adding steroids. She will need radiation outpatient for palliation. #Transaminitis, due to malignancy. #Hyponatremia, potassium was repleted.  Today potassium improved to 3.1. #Goal of care, we  discussed briefly that her condition is most likely incurable.  Awaiting tissue diagnosis.  Per patient's request, I called patient's family members.  I was not able to reach her daughter Chrys Racer on [phone].  Called patient's home and talk to patient's husband Eulas Post and updated him.  Husband says he will relay information to the daughter.  Plan was discussed with Dr.Guru.   Thank you for allowing me to participate in the care of this patient.  Total face to face encounter time for this patient visit was 70 min. >50% of the time was  spent in counseling and coordination of care.    Earlie Server, MD, PhD Hematology Oncology [facility] Pager- [phone] 12/18/2018

## 2018-12-18 NOTE — ED Notes (Signed)
ED TO INPATIENT HANDOFF REPORT  ED Nurse Name and Phone #: Ena Dawley [phone]  S Name/Age/Gender [name] 73 y.o. female Room/Bed: ED06A/ED06A  Code Status   Code Status: Full Code  Home/SNF/Other Home Patient oriented to: self, place, time and situation Is this baseline? Yes   Triage Complete: Triage complete  Chief Complaint SOB; Abnormal Labs  Triage Note Pt presents to ER after receiving a call from PCP for abnormal lab work, elevated calcium and uric acid, pt also reports also today had a CT and found a mass in left lung, pt reports feeling tired more than usual, some shortness of breath with activity,  left hip pain and dry cough. Pt talks in complete sentences no distress noted    Allergies Allergies  Allergen Reactions  . Furadantin  [Nitrofurantoin]   . Phenobarbital Rash    Level of Care/Admitting Diagnosis ED Disposition    ED Disposition Condition [facility] Area: [place] [[id number]]  Level of Care: Med-Surg [16]  Covid Evaluation: Asymptomatic Screening Protocol (No Symptoms)  Diagnosis: Respiratory failure, acute [facility]) [[id number]]  Admitting Physician: Mayer Camel [[id number]]  Attending Physician: Mayer Camel [[id number]]  Estimated length of stay: past midnight tomorrow  Certification:: I certify this patient will need inpatient services for at least 2 midnights  PT Class (Do Not Modify): Inpatient [101]  PT Acc Code (Do Not Modify): Private [1]       B Medical/Surgery History Past Medical History:  Diagnosis Date  . Allergy   . Cancer ([place]) 09/2014   Skin Cancer  . Hyperlipidemia   . Hypertension    Past Surgical History:  Procedure Laterality Date  . ABDOMINAL HYSTERECTOMY  1989   due to menorrhagia  . COLONOSCOPY    . DILATION AND CURETTAGE OF UTERUS  1977  . SKIN CANCER EXCISION  10/2014   on chest; Dr. Evorn Gong  . TRIGGER FINGER RELEASE Right 2000  . WRIST ARTHROPLASTY Left 1991     A IV  Location/Drains/Wounds Patient Lines/Drains/Airways Status   Active Line/Drains/Airways    Name:   Placement date:   Placement time:   Site:   Days:   Peripheral IV 12/17/18 Left Antecubital   12/17/18    2033    Antecubital   1          Intake/Output Last 24 hours  Intake/Output Summary (Last 24 hours) at 12/18/2018 0242 Last data filed at 12/17/2018 2326 Gross per 24 hour  Intake 1000 ml  Output -  Net 1000 ml    Labs/Imaging Results for orders placed or performed during the hospital encounter of 12/17/18 (from the past 48 hour(s))  CBC     Status: Abnormal   Collection Time: 12/17/18  8:35 PM  Result Value Ref Range   WBC 14.0 (H) 4.0 - 10.5 K/uL   RBC 4.36 3.87 - 5.11 MIL/uL   Hemoglobin 13.8 12.0 - 15.0 g/dL   HCT 40.7 36.0 - 46.0 %   MCV 93.3 80.0 - 100.0 fL   MCH 31.7 26.0 - 34.0 pg   MCHC 33.9 30.0 - 36.0 g/dL   RDW 13.2 11.5 - 15.5 %   Platelets 246 150 - 400 K/uL   nRBC 0.0 0.0 - 0.2 %    Comment: Performed at [facility], [address] [place], [place] [address]  Comprehensive metabolic panel     Status: Abnormal   Collection Time: 12/17/18  8:35 PM  Result Value Ref  Range   Sodium 137 135 - 145 mmol/L   Potassium 2.7 (LL) 3.5 - 5.1 mmol/L    Comment: CRITICAL RESULT CALLED TO, READ BACK BY AND VERIFIED WITH JESSICA FULTER ON 12/17/18 AT [address] [facility]    Chloride 102 98 - 111 mmol/L   CO2 24 22 - 32 mmol/L   Glucose, Bld 134 (H) 70 - 99 mg/dL   BUN 26 (H) 8 - 23 mg/dL   Creatinine, Ser 0.83 0.44 - 1.00 mg/dL   Calcium 12.2 (H) 8.9 - 10.3 mg/dL   Total Protein 6.4 (L) 6.5 - 8.1 g/dL   Albumin 2.9 (L) 3.5 - 5.0 g/dL   AST 74 (H) 15 - 41 U/L   ALT 95 (H) 0 - 44 U/L   Alkaline Phosphatase 153 (H) 38 - 126 U/L   Total Bilirubin 0.8 0.3 - 1.2 mg/dL   GFR calc non Af Amer >60 >60 mL/min   GFR calc Af Amer >60 >60 mL/min   Anion gap 11 5 - 15    Comment: Performed at [facility], [address] [place], [place] [address]  SARS Coronavirus  2 [facility] order, Performed in [facility] hospital lab) Nasopharyngeal Nasopharyngeal Swab     Status: None   Collection Time: 12/17/18  8:55 PM   Specimen: Nasopharyngeal Swab  Result Value Ref Range   SARS Coronavirus 2 NEGATIVE NEGATIVE    Comment: (NOTE) If result is NEGATIVE SARS-CoV-2 target nucleic acids are NOT DETECTED. The SARS-CoV-2 RNA is generally detectable in upper and lower  respiratory specimens during the acute phase of infection. The lowest  concentration of SARS-CoV-2 viral copies this assay can detect is 250  copies / mL. A negative result does not preclude SARS-CoV-2 infection  and should not be used as the sole basis for treatment or other  patient management decisions.  A negative result may occur with  improper specimen collection / handling, submission of specimen other  than nasopharyngeal swab, presence of viral mutation(s) within the  areas targeted by this assay, and inadequate number of viral copies  (<250 copies / mL). A negative result must be combined with clinical  observations, patient history, and epidemiological information. If result is POSITIVE SARS-CoV-2 target nucleic acids are DETECTED. The SARS-CoV-2 RNA is generally detectable in upper and lower  respiratory specimens dur ing the acute phase of infection.  Positive  results are indicative of active infection with SARS-CoV-2.  Clinical  correlation with patient history and other diagnostic information is  necessary to determine patient infection status.  Positive results do  not rule out bacterial infection or co-infection with other viruses. If result is PRESUMPTIVE POSTIVE SARS-CoV-2 nucleic acids MAY BE PRESENT.   A presumptive positive result was obtained on the submitted specimen  and confirmed on repeat testing.  While 2019 novel coronavirus  (SARS-CoV-2) nucleic acids may be present in the submitted sample  additional confirmatory testing may be necessary for epidemiological  and  / or clinical management purposes  to differentiate between  SARS-CoV-2 and other Sarbecovirus currently known to infect humans.  If clinically indicated additional testing with an alternate test  methodology [phone]) is advised. The SARS-CoV-2 RNA is generally  detectable in upper and lower respiratory sp ecimens during the acute  phase of infection. The expected result is Negative. Fact Sheet for Patients:  StrictlyIdeas.no Fact Sheet for Healthcare Providers: BankingDealers.co.za This test is not yet approved or cleared by the [place] FDA and has been authorized for detection  and/or diagnosis of SARS-CoV-2 by FDA under an Emergency Use Authorization (EUA).  This EUA will remain in effect (meaning this test can be used) for the duration of the COVID-19 declaration under Section 564(b)(1) of the Act, 21 U.S.C. section 360bbb-3(b)(1), unless the authorization is terminated or revoked sooner. Performed at [facility], [address] [place], [place] [address]   Urinalysis, Complete w Microscopic     Status: Abnormal   Collection Time: 12/17/18 11:30 PM  Result Value Ref Range   Color, Urine YELLOW (A) YELLOW   APPearance HAZY (A) CLEAR   Specific Gravity, Urine >1.046 (H) 1.005 - 1.030   pH 5.0 5.0 - 8.0   Glucose, UA NEGATIVE NEGATIVE mg/dL   Hgb urine dipstick NEGATIVE NEGATIVE   Bilirubin Urine NEGATIVE NEGATIVE   Ketones, ur 5 (A) NEGATIVE mg/dL   Protein, ur NEGATIVE NEGATIVE mg/dL   Nitrite NEGATIVE NEGATIVE   Leukocytes,Ua NEGATIVE NEGATIVE   RBC / HPF 0-5 0 - 5 RBC/hpf   WBC, UA 0-5 0 - 5 WBC/hpf   Bacteria, UA NONE SEEN NONE SEEN   Squamous Epithelial / LPF 0-5 0 - 5    Comment: Performed at [facility], [address], [place], [place] [address]   Dg Chest 2 View  Result Date: 12/17/2018 CLINICAL DATA:  History of cancer. Former smoker. Shortness of breath. EXAM: CHEST - 2 VIEW COMPARISON:   03/20/2016.  06/28/2012. FINDINGS: Right hilar fullness. Diffuse bilateral interstitial prominence. Multiple bilateral pulmonary nodular densities. These findings suggest malignancy with possible right hilar adenopathy, interstitial tumor spread, and metastatic disease. An inflammatory or infectious process including active granulomatous disease could also present in this fashion. Contrast-enhanced chest CT should be considered for further evaluation. No pleural effusion or pneumothorax. Heart size normal. Thoracic spine scoliosis. IMPRESSION: Right hilar fullness, diffuse bilateral interstitial prominence, and multiple bilateral pulmonary nodular densities. These findings suggest malignancy with possible right hilar adenopathy, interstitial tumor spread, and metastatic disease. An inflammatory or infectious process including active granulomatous disease could also present this fashion. Contrast-enhanced chest CT should be considered for further evaluation. Electronically Signed   By: Marcello Moores  Register   On: 12/17/2018 08:14   Ct Chest W Contrast  Result Date: 12/17/2018 CLINICAL DATA:  Follow-up exam. History of metastasis and lung nodules. EXAM: CT CHEST, ABDOMEN, AND PELVIS WITH CONTRAST TECHNIQUE: Multidetector CT imaging of the chest, abdomen and pelvis was performed following the standard protocol during bolus administration of intravenous contrast. CONTRAST:  124mL OMNIPAQUE IOHEXOL 300 MG/ML  SOLN COMPARISON:  Chest radiograph 12/16/2018; MRI lumbar spine 12/02/2018 FINDINGS: CT CHEST FINDINGS Cardiovascular: Normal heart size. Trace fluid superior pericardial recess. Thoracic aortic vascular calcifications. Mediastinum/Nodes: No axillary lymphadenopathy. There is a 1.9 cm centrally necrotic precarinal node (image 22; series 2). There is a 1.7 cm right hilar node (image 27; series 2). There is a 1.9 cm left hilar node (image 30; series 2). There is a 1.2 cm centrally necrotic nodule along the posterior  left pericardium (image 38; series 2). Mild wall thickening of the esophagus. Lungs/Pleura: Central airways are patent. There is a dominant centrally necrotic 6.2 x 5.9 cm mass within the right lower lobe (image 68; series 4). There is a 2.9 cm subpleural left lower lobe nodule (image 138; series 4). There are multiple bilateral pulmonary nodules. Reference 1.7 cm left lower lobe nodule (image 115; series 4). Reference 0.8 cm left upper lobe nodule (image 42; series 4). Reference 1.5 cm right middle lobe nodule (image  66; series 4). 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 There is a moderate right pleural effusion. Musculoskeletal: No aggressive or acute appearing osseous lesions. CT ABDOMEN PELVIS FINDINGS Hepatobiliary: There are innumerable low-attenuation lesions throughout the left and right hepatic lobes, the majority are subcentimeter in size. Gallbladder is unremarkable. No intrahepatic or extrahepatic biliary ductal dilatation. Pancreas: Unremarkable Spleen: Unremarkable Adrenals/Urinary Tract: Normal adrenal glands. Kidneys enhance symmetrically with contrast. Subcentimeter too small to characterize low-attenuation lesion inferior pole left kidney (image 77; series 2). Urinary bladder is decompressed. Stomach/Bowel: Stool throughout the colon. No abnormal bowel wall thickening or evidence for bowel obstruction. No free fluid or free intraperitoneal air. Normal morphology of the stomach. Vascular/Lymphatic: Normal caliber abdominal aorta. Peripheral calcified atherosclerotic plaque. 1.3 cm left periaortic lymph node (image 71; series 2). 1.1 cm aortocaval node (image 80; series 2). There is a 1.0 cm porta hepatic node (image 65; series 2). Reproductive: Prior hysterectomy. Adnexal structures are unremarkable.  Other: None. Musculoskeletal: Lumbar spine degenerative changes. There is a 6.7 x 5.7 cm lytic soft tissue mass involving the right hemi sacrum (image 93; series 2) with soft tissue involving the sacral spinal canal. There is a 2.4 cm soft tissue lytic lesion within the posterior right ilium (image 92; series 2). Additional lytic lesions within the right ilium. IMPRESSION: 1. Large centrally necrotic right lower lobe mass concerning the possibility of primary pulmonary malignancy. 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 2. Multiple enlarged and necrotic mediastinal and hilar nodes most compatible with nodal metastasis. 3. Enlarged retroperitoneal nodes compatible with metastasis. 4. Innumerable low-attenuation lesions within liver compatible metastasis. 5. Large lytic mass involving the right hemi sacrum as well as smaller lytic lesions involving the right ilium consistent with osseous metastasis. Electronically Signed   By: Lovey Newcomer M.D.   On: 12/17/2018 16:47   Ct Abdomen Pelvis W Contrast  Result Date: 12/17/2018 CLINICAL DATA:  Follow-up exam. History of metastasis and lung nodules. EXAM: CT CHEST, ABDOMEN, AND PELVIS WITH CONTRAST TECHNIQUE: Multidetector CT imaging of the chest, abdomen and pelvis was performed following the standard protocol during bolus administration of intravenous contrast. CONTRAST:  141mL OMNIPAQUE IOHEXOL 300 MG/ML  SOLN COMPARISON:  Chest radiograph 12/16/2018; MRI lumbar spine 12/02/2018 FINDINGS: CT CHEST FINDINGS Cardiovascular: Normal heart size. Trace fluid superior pericardial recess. Thoracic aortic vascular calcifications. Mediastinum/Nodes: No axillary lymphadenopathy. There is a 1.9 cm centrally necrotic precarinal node (image 22; series 2). There is a 1.7 cm right hilar node (image 27; series 2). There is a 1.9 cm left  hilar node (image 30; series 2). There is a 1.2 cm centrally necrotic nodule along the posterior left pericardium (image 38; series 2). Mild wall thickening of the esophagus. Lungs/Pleura: Central airways are patent. There is a dominant centrally necrotic 6.2 x 5.9 cm mass within the right lower lobe (image 68; series 4). There is a 2.9 cm subpleural left lower lobe nodule (image 138; series 4). There are multiple bilateral pulmonary nodules. Reference 1.7 cm left lower lobe nodule (image 115; series 4). Reference 0.8 cm left upper lobe nodule (image 42; series 4). Reference 1.5 cm right middle lobe nodule (image 66; series 4). 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 There is a moderate right pleural effusion. Musculoskeletal: No aggressive or acute appearing osseous lesions. CT ABDOMEN PELVIS FINDINGS Hepatobiliary: There are innumerable low-attenuation lesions throughout the left and right hepatic lobes, the majority are subcentimeter in size. Gallbladder is unremarkable. No intrahepatic or extrahepatic biliary ductal dilatation. Pancreas: Unremarkable Spleen: Unremarkable Adrenals/Urinary Tract: Normal adrenal glands. Kidneys enhance symmetrically with contrast. Subcentimeter too small to characterize low-attenuation lesion inferior pole left kidney (image 77; series 2). Urinary bladder is decompressed. Stomach/Bowel: Stool throughout the colon. No abnormal bowel wall thickening or evidence for bowel obstruction. No free fluid or free intraperitoneal air. Normal morphology of the stomach. Vascular/Lymphatic: Normal caliber abdominal aorta. Peripheral calcified atherosclerotic plaque. 1.3 cm left periaortic lymph node (image 71; series 2). 1.1 cm aortocaval node (image 80; series 2). There is a 1.0 cm porta hepatic node  (image 65; series 2). Reproductive: Prior hysterectomy. Adnexal structures are unremarkable. Other: None. Musculoskeletal: Lumbar spine degenerative changes. There is a 6.7 x 5.7 cm lytic soft tissue mass involving the right hemi sacrum (image 93; series 2) with soft tissue involving the sacral spinal canal. There is a 2.4 cm soft tissue lytic lesion within the posterior right ilium (image 92; series 2). Additional lytic lesions within the right ilium. IMPRESSION: 1. Large centrally necrotic right lower lobe mass concerning the possibility of primary pulmonary malignancy. 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 2. Multiple enlarged and necrotic mediastinal and hilar nodes most compatible with nodal metastasis. 3. Enlarged retroperitoneal nodes compatible with metastasis. 4. Innumerable low-attenuation lesions within liver compatible metastasis. 5. Large lytic mass involving the right hemi sacrum as well as smaller lytic lesions involving the right ilium consistent with osseous metastasis. Electronically Signed   By: Lovey Newcomer M.D.   On: 12/17/2018 16:47    Pending Labs Unresulted Labs (From admission, onward)    Start     Ordered   12/24/18 0500  Creatinine, serum  (enoxaparin (LOVENOX)    CrCl >/= 30 ml/min)  Weekly,   STAT    Comments: while on enoxaparin therapy    12/18/18 0220   12/18/18 5885  Basic metabolic panel  Tomorrow morning,   STAT     12/18/18 0220   12/18/18 0500  CBC  Tomorrow morning,   STAT     12/18/18 0220   12/18/18 0221  CBC  (enoxaparin (LOVENOX)    CrCl >/= 30 ml/min)  Once,   STAT    Comments: Baseline for enoxaparin therapy IF NOT ALREADY DRAWN.  Notify MD if PLT < 100 K.    12/18/18 0220   12/18/18 0221  Creatinine, serum  (enoxaparin (LOVENOX)    CrCl >/= 30 ml/min)  Once,   STAT    Comments: Baseline for enoxaparin  therapy IF NOT ALREADY DRAWN.    12/18/18 0220          Vitals/Pain Today's Vitals   12/17/18 2245 12/17/18 2300 12/17/18 2326 12/18/18 0238  BP:  131/79  (!) 145/87  Pulse: (!) 101 99  100  Resp: 20 20  18   Temp:      TempSrc:      SpO2: 91% 91%  94%  Weight:      Height:  PainSc:   6      Isolation Precautions Airborne and Contact precautions  Medications Medications  albuterol (VENTOLIN HFA) 108 (90 Base) MCG/ACT inhaler 2 puff (has no administration in time range)  loratadine (CLARITIN) tablet 10 mg (has no administration in time range)  fluticasone (FLONASE) 50 MCG/ACT nasal spray 2 spray (has no administration in time range)  gabapentin (NEURONTIN) capsule 300 mg (has no administration in time range)  hydrochlorothiazide (MICROZIDE) capsule 12.5 mg (has no administration in time range)  metoprolol succinate (TOPROL-XL) 24 hr tablet 25 mg (has no administration in time range)  montelukast (SINGULAIR) tablet 10 mg (has no administration in time range)  folic acid (FOLVITE) tablet 1 mg (has no administration in time range)  sodium chloride flush (NS) 0.9 % injection 3 mL (has no administration in time range)  sodium chloride flush (NS) 0.9 % injection 3 mL (has no administration in time range)  0.9 %  sodium chloride infusion (has no administration in time range)  acetaminophen (TYLENOL) tablet 650 mg (has no administration in time range)    Or  acetaminophen (TYLENOL) suppository 650 mg (has no administration in time range)  polyethylene glycol (MIRALAX / GLYCOLAX) packet 17 g (has no administration in time range)  ondansetron (ZOFRAN) tablet 4 mg (has no administration in time range)    Or  ondansetron (ZOFRAN) injection 4 mg (has no administration in time range)  enoxaparin (LOVENOX) injection 40 mg (has no administration in time range)  potassium chloride 10 mEq in 100 mL IVPB (has no administration in time range)  potassium chloride (KLOR-CON) packet 20 mEq  (20 mEq Oral Given 12/18/18 0215)  0.9 %  sodium chloride infusion ( Intravenous Stopped 12/17/18 2326)    Mobility walks with person assist Low fall risk   Focused Assessments Cardiac Assessment Handoff:    No results found for: CKTOTAL, CKMB, CKMBINDEX, TROPONINI No results found for: DDIMER Does the Patient currently have chest pain? No      R Recommendations: See Admitting Provider Note  Report given to:   Additional Notes:

## 2018-12-18 NOTE — H&P (Signed)
[facility] NAME: Elizabeth [name]    MR#:  [id number]  DATE OF BIRTH:  1946-04-08  DATE OF ADMISSION:  12/17/2018  PRIMARY CARE PHYSICIAN: Mar Daring, PA-C   REQUESTING/REFERRING Renella Cunas, MD  CHIEF COMPLAINT:   Chief Complaint  [name] presents with   Shortness of Breath   Hip Pain   Fatigue    HISTORY OF PRESENT ILLNESS:  Elizabeth [name]  is a 72 y.o. female with a known history of hypertension and hyperlipidemia.  She presented to [facility] emergency department for further evaluation of abnormal CT chest as well as abnormal lab results.  She has become increasingly short of breath with nonproductive cough and fatigue over Elizabeth last 2 to 3 weeks as well as increasing right hip pain over Elizabeth last 2 weeks.  She initially had abnormal labs with hypercalcemia and hypokalemia therefore was sent by Dr. Tasia Catchings for CT scans demonstrating large necrotic mass in Elizabeth lungs most consistent with primary lung cancer with severe metastasis spread throughout Elizabeth lungs, liver, and possibly abdomen, with likely lytic lesions as well.  She was hypoxic on her arrival to [facility] emergency room with 87% oxygen saturation on room air.  Oxygen saturation improved into Elizabeth upper 90s with oxygen at 2 L/min.  In addition to Elizabeth above symptoms, [name] also reports unintentional weight loss of approximately 10 pounds over Elizabeth last 2 weeks.  She has noted no night sweats.  She denies hemoptysis.  She denies nausea or vomiting.  She denies diarrhea.  She denies abdominal pain or chest pain.  She has a 17.5-pack-year history of tobacco abuse with cessation 15 years ago.  She is obviously short of breath during conversation.  She tells me this is very abnormal for her and she has not been short of breath prior to about 1 month ago.  On arrival to [facility] emergency room, potassium is 2.7 and calcium is 12.2.  We have admitted her to [facility] hospitalist service for  further management.  PAST MEDICAL HISTORY:   Past Medical History:  Diagnosis Date   Allergy    Cancer ([place]) 09/2014   Skin Cancer   Hyperlipidemia    Hypertension     PAST SURGICAL HISTORY:   Past Surgical History:  Procedure Laterality Date   ABDOMINAL HYSTERECTOMY  1989   due to menorrhagia   COLONOSCOPY     DILATION AND CURETTAGE OF UTERUS  1977   SKIN CANCER EXCISION  10/2014   on chest; Dr. Evorn Gong   TRIGGER FINGER RELEASE Right 2000   WRIST ARTHROPLASTY Left 1991    SOCIAL HISTORY:   Social History   Tobacco Use   Smoking status: Former Smoker    Packs/day: 0.50    Years: 35.00    Pack years: 17.50    Quit date: 05/20/2003    Years since quitting: 15.5   Smokeless tobacco: Never Used  Substance Use Topics   Alcohol use: Yes    Alcohol/week: 14.0 standard drinks    Types: 14 Glasses of wine per week    Comment: occasional; 12/16/18 - states no alcohol since 12/02/18     FAMILY HISTORY:   Family History  Problem Relation Age of Onset   Arthritis Mother    Cerebrovascular Disease Mother    Heart disease Mother    Vascular Disease Mother    Hypertension Father    COPD Father    Throat cancer Father    Melanoma  Father    Prostate cancer Father    Skin cancer Brother    Skin cancer Brother    Breast cancer Paternal Aunt 86    DRUG ALLERGIES:   Allergies  Allergen Reactions   Furadantin  [Nitrofurantoin]    Phenobarbital Rash    REVIEW OF SYSTEMS:   Review of Systems  Constitutional: Positive for malaise/fatigue and weight loss (10 pounds in about 2 weeks unintentional). Negative for chills, diaphoresis and fever.  HENT: Positive for congestion. Negative for sinus pain and sore throat.   Eyes: Negative for blurred vision and double vision.  Respiratory: Positive for cough (Nonproductive), shortness of breath (over Elizabeth last month- worse with exertion ) and wheezing. Negative for hemoptysis and sputum production.     Cardiovascular: Negative for chest pain, palpitations and leg swelling.  Gastrointestinal: Negative for abdominal pain, blood in stool, constipation, diarrhea, heartburn, melena, nausea and vomiting.  Genitourinary: Negative for dysuria, flank pain, frequency and hematuria.  Musculoskeletal: Positive for joint pain (Left hip pain). Negative for falls.  Skin: Negative for itching and rash.  Neurological: Negative for dizziness, weakness and headaches.  Psychiatric/Behavioral: Negative for depression.    MEDICATIONS AT HOME:   Prior to Admission medications   Medication Sig Start Date End Date Taking? Authorizing Provider  albuterol (VENTOLIN HFA) 108 (90 Base) MCG/ACT inhaler Inhale 2 puffs into Elizabeth lungs every 6 (six) hours as needed for wheezing or shortness of breath. 12/09/18  Yes Burnette, Clearnce Sorrel, PA-C  benzonatate (TESSALON) 200 MG capsule Take 1 capsule (200 mg total) by mouth 3 (three) times daily as needed. 12/09/18  Yes Mar Daring, PA-C  cetirizine (ZYRTEC) 10 MG tablet TAKE 1 TABLET BY MOUTH ONCE A DAY 12/04/17  Yes Mar Daring, PA-C  cholecalciferol (VITAMIN D) 1000 units tablet Take 1,000 Units by mouth daily.   Yes [provider]  CINNAMON PO Take by mouth daily.    Yes [provider]  fluticasone (FLONASE) 50 MCG/ACT nasal spray PLACE 2 SPRAYS INTO BOTH NOSTRILS DAILY 09/29/18  Yes Fenton Malling M, PA-C  Fluticasone-Salmeterol (ADVAIR DISKUS) 250-50 MCG/DOSE AEPB INHALE 1 PUFF INTO LUNGS EVERY 12 HOURS (TWICE DAILY) RINSE MOUTH AFTER USE. [name] taking differently: as needed. INHALE 1 PUFF INTO LUNGS EVERY 12 HOURS (TWICE DAILY) RINSE MOUTH AFTER USE 06/11/18  Yes Fenton Malling M, PA-C  folic acid (FOLVITE) 1 MG tablet Take 1 mg by mouth daily.    Yes [provider]  gabapentin (NEURONTIN) 300 MG capsule Take 1 capsule (300 mg total) by mouth 3 (three) times daily. 12/16/18  Yes Earlie Server, MD  Ginger, Zingiber  officinalis, (GINGER PO) Take by mouth.   Yes [provider]  hydrochlorothiazide (MICROZIDE) 12.5 MG capsule TAKE 1 CAPSULE BY MOUTH ONCE DAILY 11/30/18  Yes Mar Daring, PA-C  HYDROcodone-acetaminophen (NORCO) 5-325 MG tablet Take 1 tablet by mouth every 6 (six) hours as needed for moderate pain. 12/16/18  Yes Earlie Server, MD  losartan (COZAAR) 100 MG tablet TAKE 1 TABLET BY MOUTH ONCE A DAY 08/05/18  Yes Mar Daring, PA-C  methotrexate 2.5 MG tablet Take 7.5 mg by mouth once a week.    Yes [provider]  metoprolol succinate (TOPROL-XL) 50 MG 24 hr tablet TAKE 1 TABLET BY MOUTH TWICE A DAY WITH OR IMMEDIATELY FOLLOWING A MEAL 08/23/18  Yes Burnette, Clearnce Sorrel, PA-C  Misc Natural Products (HERBAL ENERGY COMPLEX PO) Take by mouth daily.    Yes [provider]  montelukast (SINGULAIR) 10 MG tablet TAKE 1 TABLET BY MOUTH ONCE DAILY 09/24/18  Yes Mar Daring, PA-C  Multiple Vitamin tablet Take 1 tablet by mouth daily.  02/27/11  Yes [provider]  Multiple Vitamins-Minerals (PRESERVISION AREDS PO) Take by mouth daily.    Yes [provider]  Multiple Vitamins-Minerals (ZINC PO) Take by mouth daily.    Yes [provider]  Probiotic Product (PROBIOTIC-10 PO) Take by mouth daily.   Yes [provider]  tiZANidine (ZANAFLEX) 4 MG tablet TAKE 1 TABLET BY MOUTH 3 TIMES DAILY AS NEEDED FOR MUSCLE SPASMS 11/11/18  Yes Fenton Malling M, PA-C  traMADol (ULTRAM) 50 MG tablet Take 50 mg by mouth every 8 (eight) hours as needed.   Yes [provider]  TURMERIC PO Take by mouth daily.    Yes [provider]  vitamin E 1000 UNIT capsule Take 1,000 Units by mouth daily.   Yes [provider]      VITAL SIGNS:  Blood pressure 131/79, pulse 99, temperature 97.8 F (36.6 C), temperature source Oral, resp. rate 20, height 5\' 6"  (1.676 m), weight 86.3 kg, SpO2 91 %.  PHYSICAL EXAMINATION:  Physical  Exam  GENERAL:  73 y.o.-year-old [name] lying in [facility] bed with no acute distress.  EYES: Pupils equal, round, reactive to light and accommodation. No scleral icterus. Extraocular muscles intact.  HEENT: Head atraumatic, normocephalic. Oropharynx and nasopharynx clear.  NECK:  Supple, no jugular venous distention. No thyroid enlargement, no tenderness.  LUNGS: Normal breath sounds bilaterally, faint expiratory wheezes bilaterally. No use of accessory muscles of respiration.  CARDIOVASCULAR: Regular rate and rhythm, S1, S2 normal. No murmurs, rubs, or gallops.  ABDOMEN: Soft, nondistended, nontender. Bowel sounds present. No organomegaly or mass.  EXTREMITIES: No pedal edema, cyanosis, or clubbing.  NEUROLOGIC: Cranial nerves II through XII are intact. Muscle strength 5/5 in all extremities. Sensation intact. Gait not checked.  PSYCHIATRIC: Elizabeth [name] is alert and oriented x 3.  Normal affect and good eye contact. SKIN: No obvious rash, lesion, or ulcer.   LABORATORY PANEL:   CBC Recent Labs  Lab 12/17/18 2035  WBC 14.0*  HGB 13.8  HCT 40.7  PLT 246   ------------------------------------------------------------------------------------------------------------------  Chemistries  Recent Labs  Lab 12/17/18 2035  NA 137  K 2.7*  CL 102  CO2 24  GLUCOSE 134*  BUN 26*  CREATININE 0.83  CALCIUM 12.2*  AST 74*  ALT 95*  ALKPHOS 153*  BILITOT 0.8   ------------------------------------------------------------------------------------------------------------------  Cardiac Enzymes No results for input(s): TROPONINI in Elizabeth last 168 hours. ------------------------------------------------------------------------------------------------------------------  RADIOLOGY:  Dg Chest 2 View  Result Date: 12/17/2018 CLINICAL DATA:  History of cancer. Former smoker. Shortness of breath. EXAM: CHEST - 2 VIEW COMPARISON:  03/20/2016.  06/28/2012. FINDINGS: Right hilar fullness. Diffuse  bilateral interstitial prominence. Multiple bilateral pulmonary nodular densities. These findings suggest malignancy with possible right hilar adenopathy, interstitial tumor spread, and metastatic disease. An inflammatory or infectious process including active granulomatous disease could also present in this fashion. Contrast-enhanced chest CT should be considered for further evaluation. No pleural effusion or pneumothorax. Heart size normal. Thoracic spine scoliosis. IMPRESSION: Right hilar fullness, diffuse bilateral interstitial prominence, and multiple bilateral pulmonary nodular densities. These findings suggest malignancy with possible right hilar adenopathy, interstitial tumor spread, and metastatic disease. An inflammatory or infectious process including active granulomatous disease could also present this fashion. Contrast-enhanced chest CT should be considered for further evaluation. Electronically Signed   By: Marcello Moores  Register  On: 12/17/2018 08:14   Ct Chest W Contrast  Result Date: 12/17/2018 CLINICAL DATA:  Follow-up exam. History of metastasis and lung nodules. EXAM: CT CHEST, ABDOMEN, AND PELVIS WITH CONTRAST TECHNIQUE: Multidetector CT imaging of Elizabeth chest, abdomen and pelvis was performed following Elizabeth standard protocol during bolus administration of intravenous contrast. CONTRAST:  139mL OMNIPAQUE IOHEXOL 300 MG/ML  SOLN COMPARISON:  Chest radiograph 12/16/2018; MRI lumbar spine 12/02/2018 FINDINGS: CT CHEST FINDINGS Cardiovascular: Normal heart size. Trace fluid superior pericardial recess. Thoracic aortic vascular calcifications. Mediastinum/Nodes: No axillary lymphadenopathy. There is a 1.9 cm centrally necrotic precarinal node (image 22; series 2). There is a 1.7 cm right hilar node (image 27; series 2). There is a 1.9 cm left hilar node (image 30; series 2). There is a 1.2 cm centrally necrotic nodule along Elizabeth posterior left pericardium (image 38; series 2). Mild wall thickening of Elizabeth  esophagus. Lungs/Pleura: Central airways are patent. There is a dominant centrally necrotic 6.2 x 5.9 cm mass within Elizabeth right lower lobe (image 68; series 4). There is a 2.9 cm subpleural left lower lobe nodule (image 138; series 4). There are multiple bilateral pulmonary nodules. Reference 1.7 cm left lower lobe nodule (image 115; series 4). Reference 0.8 cm left upper lobe nodule (image 42; series 4). Reference 1.5 cm right middle lobe nodule (image 66; series 4). 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 There is a moderate right pleural effusion. Musculoskeletal: No aggressive or acute appearing osseous lesions. CT ABDOMEN PELVIS FINDINGS Hepatobiliary: There are innumerable low-attenuation lesions throughout Elizabeth left and right hepatic lobes, Elizabeth majority are subcentimeter in size. Gallbladder is unremarkable. No intrahepatic or extrahepatic biliary ductal dilatation. Pancreas: Unremarkable Spleen: Unremarkable Adrenals/Urinary Tract: Normal adrenal glands. Kidneys enhance symmetrically with contrast. Subcentimeter too small to characterize low-attenuation lesion inferior pole left kidney (image 77; series 2). Urinary bladder is decompressed. Stomach/Bowel: Stool throughout Elizabeth colon. No abnormal bowel wall thickening or evidence for bowel obstruction. No free fluid or free intraperitoneal air. Normal morphology of Elizabeth stomach. Vascular/Lymphatic: Normal caliber abdominal aorta. Peripheral calcified atherosclerotic plaque. 1.3 cm left periaortic lymph node (image 71; series 2). 1.1 cm aortocaval node (image 80; series 2). There is a 1.0 cm porta hepatic node (image 65; series 2). Reproductive: Prior hysterectomy. Adnexal structures are unremarkable. Other: None. Musculoskeletal: Lumbar spine degenerative changes. There  is a 6.7 x 5.7 cm lytic soft tissue mass involving Elizabeth right hemi sacrum (image 93; series 2) with soft tissue involving Elizabeth sacral spinal canal. There is a 2.4 cm soft tissue lytic lesion within Elizabeth posterior right ilium (image 92; series 2). Additional lytic lesions within Elizabeth right ilium. IMPRESSION: 1. Large centrally necrotic right lower lobe mass concerning Elizabeth possibility of primary pulmonary malignancy. 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 2. Multiple enlarged and necrotic mediastinal and hilar nodes most compatible with nodal metastasis. 3. Enlarged retroperitoneal nodes compatible with metastasis. 4. Innumerable low-attenuation lesions within liver compatible metastasis. 5. Large lytic mass involving Elizabeth right hemi sacrum as well as smaller lytic lesions involving Elizabeth right ilium consistent with osseous metastasis. Electronically Signed   By: Lovey Newcomer M.D.   On: 12/17/2018 16:47   Ct Abdomen Pelvis W Contrast  Result Date:  12/17/2018 CLINICAL DATA:  Follow-up exam. History of metastasis and lung nodules. EXAM: CT CHEST, ABDOMEN, AND PELVIS WITH CONTRAST TECHNIQUE: Multidetector CT imaging of Elizabeth chest, abdomen and pelvis was performed following Elizabeth standard protocol during bolus administration of intravenous contrast. CONTRAST:  147mL OMNIPAQUE IOHEXOL 300 MG/ML  SOLN COMPARISON:  Chest radiograph 12/16/2018; MRI lumbar spine 12/02/2018 FINDINGS: CT CHEST FINDINGS Cardiovascular: Normal heart size. Trace fluid superior pericardial recess. Thoracic aortic vascular calcifications. Mediastinum/Nodes: No axillary lymphadenopathy. There is a 1.9 cm centrally necrotic precarinal node (image 22; series 2). There is a 1.7 cm right hilar node (image 27; series 2). There is a 1.9 cm left hilar node (image 30; series 2). There is a 1.2 cm centrally necrotic  nodule along Elizabeth posterior left pericardium (image 38; series 2). Mild wall thickening of Elizabeth esophagus. Lungs/Pleura: Central airways are patent. There is a dominant centrally necrotic 6.2 x 5.9 cm mass within Elizabeth right lower lobe (image 68; series 4). There is a 2.9 cm subpleural left lower lobe nodule (image 138; series 4). There are multiple bilateral pulmonary nodules. Reference 1.7 cm left lower lobe nodule (image 115; series 4). Reference 0.8 cm left upper lobe nodule (image 42; series 4). Reference 1.5 cm right middle lobe nodule (image 66; series 4). 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 There is a moderate right pleural effusion. Musculoskeletal: No aggressive or acute appearing osseous lesions. CT ABDOMEN PELVIS FINDINGS Hepatobiliary: There are innumerable low-attenuation lesions throughout Elizabeth left and right hepatic lobes, Elizabeth majority are subcentimeter in size. Gallbladder is unremarkable. No intrahepatic or extrahepatic biliary ductal dilatation. Pancreas: Unremarkable Spleen: Unremarkable Adrenals/Urinary Tract: Normal adrenal glands. Kidneys enhance symmetrically with contrast. Subcentimeter too small to characterize low-attenuation lesion inferior pole left kidney (image 77; series 2). Urinary bladder is decompressed. Stomach/Bowel: Stool throughout Elizabeth colon. No abnormal bowel wall thickening or evidence for bowel obstruction. No free fluid or free intraperitoneal air. Normal morphology of Elizabeth stomach. Vascular/Lymphatic: Normal caliber abdominal aorta. Peripheral calcified atherosclerotic plaque. 1.3 cm left periaortic lymph node (image 71; series 2). 1.1 cm aortocaval node (image 80; series 2). There is a 1.0 cm porta hepatic node (image 65; series 2). Reproductive: Prior hysterectomy. Adnexal  structures are unremarkable. Other: None. Musculoskeletal: Lumbar spine degenerative changes. There is a 6.7 x 5.7 cm lytic soft tissue mass involving Elizabeth right hemi sacrum (image 93; series 2) with soft tissue involving Elizabeth sacral spinal canal. There is a 2.4 cm soft tissue lytic lesion within Elizabeth posterior right ilium (image 92; series 2). Additional lytic lesions within Elizabeth right ilium. IMPRESSION: 1. Large centrally necrotic right lower lobe mass concerning Elizabeth possibility of primary pulmonary malignancy. 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 2. Multiple enlarged and necrotic mediastinal and hilar nodes most compatible with nodal metastasis. 3. Enlarged retroperitoneal nodes compatible with metastasis. 4. Innumerable low-attenuation lesions within liver compatible metastasis. 5. Large lytic mass involving Elizabeth right hemi sacrum as well as smaller lytic lesions involving Elizabeth right ilium consistent with osseous metastasis. Electronically Signed   By: Lovey Newcomer M.D.   On: 12/17/2018 16:47      IMPRESSION AND PLAN:   1.  Acute hypoxic respiratory failure - Improved with oxygen at 2  L per nasal cannula with current oxygen saturations in Elizabeth upper 90s - [name] will likely need evaluation for possible home oxygen therapy -DuoNebs every 6 hours as needed for shortness of breath/cough  2.  Abnormal chest CT - Demonstrating large right lower lobe mass concerning for primary pulmonary malignancy - Pulmonary service consulted, Dr. Lanney Gins, for evaluation of respiratory failure and abnormal CT with large right lung mass with likely metastasis - Dr. Tasia Catchings consulted for likely lung cancer with metastasis-[name] is familiar to Dr. Tasia Catchings  3.  Hypokalemia - [name] has received IV and p.o. potassium replacement - Will repeat BMP in Elizabeth a.m. and monitor  potassium level -Telemetry monitoring  4.  Hypocalcemia - Serum calcium is 12.2 -We will get ionized calcium level and treat accordingly  5.  Hypertension - Treated with Cozaar -We will treat persistent hypertension expectantly with PRN medications  She is on telemetry monitoring  DVT and PPI prophylaxis initiated    All [facility] records are reviewed and case discussed with ED provider. Elizabeth plan of care was discussed in details with Elizabeth [name] (and family). I answered all questions. Elizabeth [name] agreed to proceed with Elizabeth above mentioned plan. Further management will depend upon hospital course.   CODE STATUS: Full code  TOTAL TIME TAKING CARE OF THIS [name]: 45 minutes.    Theo Dills Rakeb Kibble CRNPon 12/18/2018 at 2:23 AM  Pager - [phone]  After 6pm go to [email or web address] - password [email or web address]  [employer] [facility]  Office  [phone]  CC: Primary care physician; Mar Daring, PA-C   Note: This dictation was prepared with Dragon dictation along with smaller phrase technology. Any transcriptional errors that result from this process are unintentional.

## 2018-12-18 NOTE — Progress Notes (Signed)
[facility] [place] NAME: Elizabeth [name]    MR#:  [id number]  DATE OF BIRTH:  [birth year]  SUBJECTIVE:  CHIEF COMPLAINT: Patient is out of bed to chair.  Feeling okay.  Had a watery bowel movement after potassium was given but denies any nausea vomiting or abdominal pain  REVIEW OF SYSTEMS:  CONSTITUTIONAL: No fever, fatigue or weakness.  EYES: No blurred or double vision.  EARS, NOSE, AND THROAT: No tinnitus or ear pain.  RESPIRATORY: No cough, shortness of breath, wheezing or hemoptysis.  CARDIOVASCULAR: No chest pain, orthopnea, edema.  GASTROINTESTINAL: No nausea, vomiting, diarrhea or abdominal pain.  GENITOURINARY: No dysuria, hematuria.  ENDOCRINE: No polyuria, nocturia,  HEMATOLOGY: No anemia, easy bruising or bleeding SKIN: No rash or lesion. MUSCULOSKELETAL: No joint pain or arthritis.   NEUROLOGIC: No tingling, numbness, weakness.  PSYCHIATRY: No anxiety or depression.   DRUG ALLERGIES:   Allergies  Allergen Reactions  . Furadantin  [Nitrofurantoin]   . Phenobarbital Rash    VITALS:  Blood pressure 108/71, pulse (!) 114, temperature (!) 97.3 F (36.3 C), temperature source Oral, resp. rate 16, height 5\' 6"  (1.676 m), weight 86.3 kg, SpO2 94 %.  PHYSICAL EXAMINATION:  GENERAL:  73 y.o.-year-old patient lying in the bed with no acute distress.  EYES: Pupils equal, round, reactive to light and accommodation. No scleral icterus. Extraocular muscles intact.  HEENT: Head atraumatic, normocephalic. Oropharynx and nasopharynx clear.  NECK:  Supple, no jugular venous distention. No thyroid enlargement, no tenderness.  LUNGS: Normal breath sounds bilaterally, no wheezing, rales,rhonchi or crepitation. No use of accessory muscles of respiration.  CARDIOVASCULAR: S1, S2 normal. No murmurs, rubs, or gallops.  ABDOMEN: Soft, nontender, nondistended. Bowel sounds present.  EXTREMITIES: No pedal edema, cyanosis, or clubbing.   NEUROLOGIC: Cranial nerves II through XII are intact. Muscle strength 5/5 in all extremities. Sensation intact. Gait not checked.  PSYCHIATRIC: The patient is alert and oriented x 3.  SKIN: No obvious rash, lesion, or ulcer.    LABORATORY PANEL:   CBC Recent Labs  Lab 12/18/18 0511  WBC 14.2*  HGB 13.4  HCT 39.9  PLT 227   ------------------------------------------------------------------------------------------------------------------  Chemistries  Recent Labs  Lab 12/17/18 2035 12/18/18 0511  NA 137 138  K 2.7* 3.1*  CL 102 103  CO2 24 24  GLUCOSE 134* 106*  BUN 26* 22  CREATININE 0.83 0.60  CALCIUM 12.2* 12.1*  AST 74*  --   ALT 95*  --   ALKPHOS 153*  --   BILITOT 0.8  --    ------------------------------------------------------------------------------------------------------------------  Cardiac Enzymes No results for input(s): TROPONINI in the last 168 hours. ------------------------------------------------------------------------------------------------------------------  RADIOLOGY:  Ct Chest W Contrast  Result Date: 12/17/2018 CLINICAL DATA:  Follow-up exam. History of metastasis and lung nodules. EXAM: CT CHEST, ABDOMEN, AND PELVIS WITH CONTRAST TECHNIQUE: Multidetector CT imaging of the chest, abdomen and pelvis was performed following the standard protocol during bolus administration of intravenous contrast. CONTRAST:  180mL OMNIPAQUE IOHEXOL 300 MG/ML  SOLN COMPARISON:  Chest radiograph 12/16/2018; MRI lumbar spine 12/02/2018 FINDINGS: CT CHEST FINDINGS Cardiovascular: Normal heart size. Trace fluid superior pericardial recess. Thoracic aortic vascular calcifications. Mediastinum/Nodes: No axillary lymphadenopathy. There is a 1.9 cm centrally necrotic precarinal node (image 22; series 2). There is a 1.7 cm right hilar node (image 27; series 2). There is a 1.9 cm left hilar node (image 30; series 2). There is a 1.2 cm centrally necrotic nodule along the  posterior left pericardium (image 38; series 2). Mild wall thickening of the esophagus. Lungs/Pleura: Central airways are patent. There is a dominant centrally necrotic 6.2 x 5.9 cm mass within the right lower lobe (image 68; series 4). There is a 2.9 cm subpleural left lower lobe nodule (image 138; series 4). There are multiple bilateral pulmonary nodules. Reference 1.7 cm left lower lobe nodule (image 115; series 4). Reference 0.8 cm left upper lobe nodule (image 42; series 4). Reference 1.5 cm right middle lobe nodule (image 66; series 4). 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 There is a moderate right pleural effusion. Musculoskeletal: No aggressive or acute appearing osseous lesions. CT ABDOMEN PELVIS FINDINGS Hepatobiliary: There are innumerable low-attenuation lesions throughout the left and right hepatic lobes, the majority are subcentimeter in size. Gallbladder is unremarkable. No intrahepatic or extrahepatic biliary ductal dilatation. Pancreas: Unremarkable Spleen: Unremarkable Adrenals/Urinary Tract: Normal adrenal glands. Kidneys enhance symmetrically with contrast. Subcentimeter too small to characterize low-attenuation lesion inferior pole left kidney (image 77; series 2). Urinary bladder is decompressed. Stomach/Bowel: Stool throughout the colon. No abnormal bowel wall thickening or evidence for bowel obstruction. No free fluid or free intraperitoneal air. Normal morphology of the stomach. Vascular/Lymphatic: Normal caliber abdominal aorta. Peripheral calcified atherosclerotic plaque. 1.3 cm left periaortic lymph node (image 71; series 2). 1.1 cm aortocaval node (image 80; series 2). There is a 1.0 cm porta hepatic node (image 65; series 2). Reproductive: Prior hysterectomy. Adnexal structures are  unremarkable. Other: None. Musculoskeletal: Lumbar spine degenerative changes. There is a 6.7 x 5.7 cm lytic soft tissue mass involving the right hemi sacrum (image 93; series 2) with soft tissue involving the sacral spinal canal. There is a 2.4 cm soft tissue lytic lesion within the posterior right ilium (image 92; series 2). Additional lytic lesions within the right ilium. IMPRESSION: 1. Large centrally necrotic right lower lobe mass concerning the possibility of primary pulmonary malignancy. 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 2. Multiple enlarged and necrotic mediastinal and hilar nodes most compatible with nodal metastasis. 3. Enlarged retroperitoneal nodes compatible with metastasis. 4. Innumerable low-attenuation lesions within liver compatible metastasis. 5. Large lytic mass involving the right hemi sacrum as well as smaller lytic lesions involving the right ilium consistent with osseous metastasis. Electronically Signed   By: Lovey Newcomer M.D.   On: 12/17/2018 16:47   Ct Abdomen Pelvis W Contrast  Result Date: 12/17/2018 CLINICAL DATA:  Follow-up exam. History of metastasis and lung nodules. EXAM: CT CHEST, ABDOMEN, AND PELVIS WITH CONTRAST TECHNIQUE: Multidetector CT imaging of the chest, abdomen and pelvis was performed following the standard protocol during bolus administration of intravenous contrast. CONTRAST:  126mL OMNIPAQUE IOHEXOL 300 MG/ML  SOLN COMPARISON:  Chest radiograph 12/16/2018; MRI lumbar spine 12/02/2018 FINDINGS: CT CHEST FINDINGS Cardiovascular: Normal heart size. Trace fluid superior pericardial recess. Thoracic aortic vascular calcifications. Mediastinum/Nodes: No axillary lymphadenopathy. There is a 1.9 cm centrally necrotic precarinal node (image 22; series 2). There is a 1.7 cm right hilar node (image 27; series 2). There is  a 1.9 cm left hilar node (image 30; series 2). There is a 1.2 cm centrally necrotic nodule along the posterior left pericardium (image 38; series 2). Mild wall thickening of the  esophagus. Lungs/Pleura: Central airways are patent. There is a dominant centrally necrotic 6.2 x 5.9 cm mass within the right lower lobe (image 68; series 4). There is a 2.9 cm subpleural left lower lobe nodule (image 138; series 4). There are multiple bilateral pulmonary nodules. Reference 1.7 cm left lower lobe nodule (image 115; series 4). Reference 0.8 cm left upper lobe nodule (image 42; series 4). Reference 1.5 cm right middle lobe nodule (image 66; series 4). 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 There is a moderate right pleural effusion. Musculoskeletal: No aggressive or acute appearing osseous lesions. CT ABDOMEN PELVIS FINDINGS Hepatobiliary: There are innumerable low-attenuation lesions throughout the left and right hepatic lobes, the majority are subcentimeter in size. Gallbladder is unremarkable. No intrahepatic or extrahepatic biliary ductal dilatation. Pancreas: Unremarkable Spleen: Unremarkable Adrenals/Urinary Tract: Normal adrenal glands. Kidneys enhance symmetrically with contrast. Subcentimeter too small to characterize low-attenuation lesion inferior pole left kidney (image 77; series 2). Urinary bladder is decompressed. Stomach/Bowel: Stool throughout the colon. No abnormal bowel wall thickening or evidence for bowel obstruction. No free fluid or free intraperitoneal air. Normal morphology of the stomach. Vascular/Lymphatic: Normal caliber abdominal aorta. Peripheral calcified atherosclerotic plaque. 1.3 cm left periaortic lymph node (image 71; series 2). 1.1 cm aortocaval node (image 80; series 2). There is a 1.0 cm  porta hepatic node (image 65; series 2). Reproductive: Prior hysterectomy. Adnexal structures are unremarkable. Other: None. Musculoskeletal: Lumbar spine degenerative changes. There is a 6.7 x 5.7 cm lytic soft tissue mass involving the right hemi sacrum (image 93; series 2) with soft tissue involving the sacral spinal canal. There is a 2.4 cm soft tissue lytic lesion within the posterior right ilium (image 92; series 2). Additional lytic lesions within the right ilium. IMPRESSION: 1. Large centrally necrotic right lower lobe mass concerning the possibility of primary pulmonary malignancy. 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 2. Multiple enlarged and necrotic mediastinal and hilar nodes most compatible with nodal metastasis. 3. Enlarged retroperitoneal nodes compatible with metastasis. 4. Innumerable low-attenuation lesions within liver compatible metastasis. 5. Large lytic mass involving the right hemi sacrum as well as smaller lytic lesions involving the right ilium consistent with osseous metastasis. Electronically Signed   By: Lovey Newcomer M.D.   On: 12/17/2018 16:47    EKG:   Orders placed or performed in visit on 10/30/14  . EKG 12-Lead    ASSESSMENT AND PLAN:    1.  Acute hypoxic respiratory failure -  Clinically getting better with oxygen at 2 L per nasal cannula with current oxygen saturations in the upper 90s - Patient will likely need evaluation for possible home oxygen therapy -DuoNebs every 6 hours as needed for shortness of breath/cough  2.    Right lower lobe 6 cm lung mass with central necrosis -chest CT - Demonstrating large right lower lobe mass concerning for primary pulmonary malignancy - Pulmonary service consulted, Dr. Lanney Gins has seen the patient - Dr. Tasia Catchings consulted for likely lung cancer with metastasis-patient is  familiar to Dr. Tasia Catchings, discussed with Dr. Tasia Catchings recommending ultrasound-guided liver biopsy by interventional radiology.   3.  Hypokalemia - Patient has received IV and p.o. potassium replacement.  Repeat potassium at 3.1 - Will repeat  BMP in the a.m. and monitor potassium level -Telemetry monitoring  4.  Hypocalcemia - Serum calcium is 12.2-after IV fluids calcium trended down to 12.1, may need IV bisphosphonate.  Will discuss with oncology -We will get ionized calcium level and treat accordingly  5.  Hypertension - Treated with Cozaar -We will treat persistent hypertension expectantly with PRN medications     All the records are reviewed and case discussed with Care Management/Social Workerr. Management plans discussed with the patient, family and they are in agreement CODE STATUS: fc   TOTAL TIME TAKING CARE OF THIS PATIENT: 36  minutes.   POSSIBLE D/C IN 2 DAYS, DEPENDING ON CLINICAL CONDITION.  Note: This dictation was prepared with Dragon dictation along with smaller phrase technology. Any transcriptional errors that result from this process are unintentional.   Nicholes Mango M.D on 12/18/2018 at 4:19 PM  Between 7am to 6pm - Pager - [phone] After 6pm go to [email or web address] - password [email or web address] [facility]  Office  [phone]  CC: Primary care physician; Mar Daring, PA-C

## 2018-12-19 ENCOUNTER — Other Ambulatory Visit: Payer: Self-pay

## 2018-12-19 LAB — CBC
HCT: 39.3 % (ref 36.0–46.0)
Hemoglobin: 12.9 g/dL (ref 12.0–15.0)
MCH: 30.6 pg (ref 26.0–34.0)
MCHC: 32.8 g/dL (ref 30.0–36.0)
MCV: 93.3 fL (ref 80.0–100.0)
Platelets: 193 10*3/uL (ref 150–400)
RBC: 4.21 MIL/uL (ref 3.87–5.11)
RDW: 13.2 % (ref 11.5–15.5)
WBC: 11.2 10*3/uL — ABNORMAL HIGH (ref 4.0–10.5)
nRBC: 0 % (ref 0.0–0.2)

## 2018-12-19 LAB — BASIC METABOLIC PANEL
Anion gap: 7 (ref 5–15)
BUN: 17 mg/dL (ref 8–23)
CO2: 27 mmol/L (ref 22–32)
Calcium: 12 mg/dL — ABNORMAL HIGH (ref 8.9–10.3)
Chloride: 105 mmol/L (ref 98–111)
Creatinine, Ser: 0.45 mg/dL (ref 0.44–1.00)
GFR calc Af Amer: >60 mL/min (ref >60)
GFR calc non Af Amer: >60 mL/min (ref >60)
Glucose, Bld: 100 mg/dL — ABNORMAL HIGH (ref 70–99)
Potassium: 3.2 mmol/L — ABNORMAL LOW (ref 3.5–5.1)
Sodium: 139 mmol/L (ref 135–145)

## 2018-12-19 LAB — RASBURICASE - URIC ACID: Uric Acid, Serum: 0.8 mg/dL — ABNORMAL LOW (ref 2.5–7.1)

## 2018-12-19 MED ORDER — TIZANIDINE HCL 4 MG PO TABS
4.0000 mg | ORAL_TABLET | Freq: Three times a day (TID) | ORAL | Status: DC | PRN
  Administered 2018-12-19 – 2018-12-25 (×7): 4 mg via ORAL
  Filled 2018-12-19 (×8): qty 1

## 2018-12-19 MED ORDER — ENSURE ENLIVE PO LIQD
237.0000 mL | Freq: Two times a day (BID) | ORAL | Status: DC
  Administered 2018-12-19 – 2018-12-22 (×4): 237 mL via ORAL

## 2018-12-19 MED ORDER — MORPHINE SULFATE (PF) 2 MG/ML IV SOLN
2.0000 mg | INTRAVENOUS | Status: DC | PRN
  Administered 2018-12-23 (×2): 2 mg via INTRAVENOUS
  Filled 2018-12-19 (×2): qty 1

## 2018-12-19 MED ORDER — MORPHINE SULFATE (PF) 2 MG/ML IV SOLN
1.0000 mg | INTRAVENOUS | Status: DC | PRN
  Administered 2018-12-19 – 2018-12-24 (×8): 1 mg via INTRAVENOUS
  Filled 2018-12-19 (×9): qty 1

## 2018-12-19 MED ORDER — METOPROLOL TARTRATE 5 MG/5ML IV SOLN
5.0000 mg | Freq: Four times a day (QID) | INTRAVENOUS | Status: DC | PRN
  Administered 2018-12-19 – 2018-12-22 (×5): 5 mg via INTRAVENOUS
  Administered 2018-12-22: 2.5 mg via INTRAVENOUS
  Administered 2018-12-24: 03:00:00 5 mg via INTRAVENOUS
  Filled 2018-12-19 (×9): qty 5

## 2018-12-19 MED ORDER — ALBUTEROL SULFATE (2.5 MG/3ML) 0.083% IN NEBU
2.5000 mg | INHALATION_SOLUTION | Freq: Three times a day (TID) | RESPIRATORY_TRACT | Status: DC
  Administered 2018-12-19 (×3): 2.5 mg via RESPIRATORY_TRACT
  Filled 2018-12-19 (×3): qty 3

## 2018-12-19 MED ORDER — LEVALBUTEROL HCL 0.63 MG/3ML IN NEBU
0.6300 mg | INHALATION_SOLUTION | Freq: Four times a day (QID) | RESPIRATORY_TRACT | Status: DC | PRN
  Administered 2018-12-20 – 2018-12-26 (×15): 0.63 mg via RESPIRATORY_TRACT
  Filled 2018-12-19 (×16): qty 3

## 2018-12-19 NOTE — Progress Notes (Signed)
[facility] NAME: Elizabeth [name]    MR#:  [id number]  DATE OF BIRTH:  12-24-1945  SUBJECTIVE:  CHIEF COMPLAINT: Patient is resting comfortably.  Slept well last night .  Tired of answering several phone calls  REVIEW OF SYSTEMS:  CONSTITUTIONAL: No fever, fatigue or weakness.  EYES: No blurred or double vision.  EARS, NOSE, AND THROAT: No tinnitus or ear pain.  RESPIRATORY: No cough, shortness of breath, wheezing or hemoptysis.  CARDIOVASCULAR: No chest pain, orthopnea, edema.  GASTROINTESTINAL: No nausea, vomiting, diarrhea or abdominal pain.  GENITOURINARY: No dysuria, hematuria.  ENDOCRINE: No polyuria, nocturia,  HEMATOLOGY: No anemia, easy bruising or bleeding SKIN: No rash or lesion. MUSCULOSKELETAL: No joint pain or arthritis.   NEUROLOGIC: No tingling, numbness, weakness.  PSYCHIATRY: No anxiety or depression.   DRUG ALLERGIES:   Allergies  Allergen Reactions  . Furadantin  [Nitrofurantoin]   . Phenobarbital Rash    VITALS:  Blood pressure 116/74, pulse (!) 124, temperature 98.3 F (36.8 C), resp. rate 19, height 5\' 6"  (1.676 m), weight 86.3 kg, SpO2 98 %.  PHYSICAL EXAMINATION:  GENERAL:  73 y.o.-year-old patient lying in the bed with no acute distress.  EYES: Pupils equal, round, reactive to light and accommodation. No scleral icterus. Extraocular muscles intact.  HEENT: Head atraumatic, normocephalic. Oropharynx and nasopharynx clear.  NECK:  Supple, no jugular venous distention. No thyroid enlargement, no tenderness.  LUNGS: Normal breath sounds bilaterally, no wheezing, rales,rhonchi or crepitation. No use of accessory muscles of respiration.  CARDIOVASCULAR: S1, S2 normal. No murmurs, rubs, or gallops.  ABDOMEN: Soft, nontender, nondistended. Bowel sounds present.  EXTREMITIES: No pedal edema, cyanosis, or clubbing.  NEUROLOGIC: Cranial nerves II through XII are intact. Muscle strength 5/5 in all  extremities. Sensation intact. Gait not checked.  PSYCHIATRIC: The patient is alert and oriented x 3.  SKIN: No obvious rash, lesion, or ulcer.    LABORATORY PANEL:   CBC Recent Labs  Lab 12/19/18 0605  WBC 11.2*  HGB 12.9  HCT 39.3  PLT 193   ------------------------------------------------------------------------------------------------------------------  Chemistries  Recent Labs  Lab 12/17/18 2035  12/19/18 0605  NA 137   < > 139  K 2.7*   < > 3.2*  CL 102   < > 105  CO2 24   < > 27  GLUCOSE 134*   < > 100*  BUN 26*   < > 17  CREATININE 0.83   < > 0.45  CALCIUM 12.2*   < > 12.0*  AST 74*  --   --   ALT 95*  --   --   ALKPHOS 153*  --   --   BILITOT 0.8  --   --    < > = values in this interval not displayed.   ------------------------------------------------------------------------------------------------------------------  Cardiac Enzymes No results for input(s): TROPONINI in the last 168 hours. ------------------------------------------------------------------------------------------------------------------  RADIOLOGY:  Ct Chest W Contrast  Result Date: 12/17/2018 CLINICAL DATA:  Follow-up exam. History of metastasis and lung nodules. EXAM: CT CHEST, ABDOMEN, AND PELVIS WITH CONTRAST TECHNIQUE: Multidetector CT imaging of the chest, abdomen and pelvis was performed following the standard protocol during bolus administration of intravenous contrast. CONTRAST:  150mL OMNIPAQUE IOHEXOL 300 MG/ML  SOLN COMPARISON:  Chest radiograph 12/16/2018; MRI lumbar spine 12/02/2018 FINDINGS: CT CHEST FINDINGS Cardiovascular: Normal heart size. Trace fluid superior pericardial recess. Thoracic aortic vascular calcifications. Mediastinum/Nodes: No axillary lymphadenopathy. There is a 1.9 cm centrally necrotic  precarinal node (image 22; series 2). There is a 1.7 cm right hilar node (image 27; series 2). There is a 1.9 cm left hilar node (image 30; series 2). There is a 1.2 cm  centrally necrotic nodule along the posterior left pericardium (image 38; series 2). Mild wall thickening of the esophagus. Lungs/Pleura: Central airways are patent. There is a dominant centrally necrotic 6.2 x 5.9 cm mass within the right lower lobe (image 68; series 4). There is a 2.9 cm subpleural left lower lobe nodule (image 138; series 4). There are multiple bilateral pulmonary nodules. Reference 1.7 cm left lower lobe nodule (image 115; series 4). Reference 0.8 cm left upper lobe nodule (image 42; series 4). Reference 1.5 cm right middle lobe nodule (image 66; series 4). 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 There is a moderate right pleural effusion. Musculoskeletal: No aggressive or acute appearing osseous lesions. CT ABDOMEN PELVIS FINDINGS Hepatobiliary: There are innumerable low-attenuation lesions throughout the left and right hepatic lobes, the majority are subcentimeter in size. Gallbladder is unremarkable. No intrahepatic or extrahepatic biliary ductal dilatation. Pancreas: Unremarkable Spleen: Unremarkable Adrenals/Urinary Tract: Normal adrenal glands. Kidneys enhance symmetrically with contrast. Subcentimeter too small to characterize low-attenuation lesion inferior pole left kidney (image 77; series 2). Urinary bladder is decompressed. Stomach/Bowel: Stool throughout the colon. No abnormal bowel wall thickening or evidence for bowel obstruction. No free fluid or free intraperitoneal air. Normal morphology of the stomach. Vascular/Lymphatic: Normal caliber abdominal aorta. Peripheral calcified atherosclerotic plaque. 1.3 cm left periaortic lymph node (image 71; series 2). 1.1 cm aortocaval node (image 80; series 2). There is a 1.0 cm porta hepatic node (image 65; series 2). Reproductive: Prior  hysterectomy. Adnexal structures are unremarkable. Other: None. Musculoskeletal: Lumbar spine degenerative changes. There is a 6.7 x 5.7 cm lytic soft tissue mass involving the right hemi sacrum (image 93; series 2) with soft tissue involving the sacral spinal canal. There is a 2.4 cm soft tissue lytic lesion within the posterior right ilium (image 92; series 2). Additional lytic lesions within the right ilium. IMPRESSION: 1. Large centrally necrotic right lower lobe mass concerning the possibility of primary pulmonary malignancy. 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 2. Multiple enlarged and necrotic mediastinal and hilar nodes most compatible with nodal metastasis. 3. Enlarged retroperitoneal nodes compatible with metastasis. 4. Innumerable low-attenuation lesions within liver compatible metastasis. 5. Large lytic mass involving the right hemi sacrum as well as smaller lytic lesions involving the right ilium consistent with osseous metastasis. Electronically Signed   By: Lovey Newcomer M.D.   On: 12/17/2018 16:47   Ct Abdomen Pelvis W Contrast  Result Date: 12/17/2018 CLINICAL DATA:  Follow-up exam. History of metastasis and lung nodules. EXAM: CT CHEST, ABDOMEN, AND PELVIS WITH CONTRAST TECHNIQUE: Multidetector CT imaging of the chest, abdomen and pelvis was performed following the standard protocol during bolus administration of intravenous contrast. CONTRAST:  158mL OMNIPAQUE IOHEXOL 300 MG/ML  SOLN COMPARISON:  Chest radiograph 12/16/2018; MRI lumbar spine 12/02/2018 FINDINGS: CT CHEST FINDINGS Cardiovascular: Normal heart size. Trace fluid superior pericardial recess. Thoracic aortic vascular calcifications. Mediastinum/Nodes: No axillary lymphadenopathy. There is a 1.9 cm centrally necrotic precarinal node (image 22; series 2). There is a 1.7 cm right  hilar node (image 27; series 2). There is a 1.9 cm left hilar node (image 30; series 2). There is a 1.2 cm centrally necrotic nodule along the posterior left pericardium (image 38; series 2). Mild wall thickening of the esophagus. Lungs/Pleura: Central airways are patent. There is a dominant centrally necrotic 6.2 x 5.9 cm mass within the right lower lobe (image 68; series 4). There is a 2.9 cm subpleural left lower lobe nodule (image 138; series 4). There are multiple bilateral pulmonary nodules. Reference 1.7 cm left lower lobe nodule (image 115; series 4). Reference 0.8 cm left upper lobe nodule (image 42; series 4). Reference 1.5 cm right middle lobe nodule (image 66; series 4). 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 There is a moderate right pleural effusion. Musculoskeletal: No aggressive or acute appearing osseous lesions. CT ABDOMEN PELVIS FINDINGS Hepatobiliary: There are innumerable low-attenuation lesions throughout the left and right hepatic lobes, the majority are subcentimeter in size. Gallbladder is unremarkable. No intrahepatic or extrahepatic biliary ductal dilatation. Pancreas: Unremarkable Spleen: Unremarkable Adrenals/Urinary Tract: Normal adrenal glands. Kidneys enhance symmetrically with contrast. Subcentimeter too small to characterize low-attenuation lesion inferior pole left kidney (image 77; series 2). Urinary bladder is decompressed. Stomach/Bowel: Stool throughout the colon. No abnormal bowel wall thickening or evidence for bowel obstruction. No free fluid or free intraperitoneal air. Normal morphology of the stomach. Vascular/Lymphatic: Normal caliber abdominal aorta. Peripheral calcified atherosclerotic plaque. 1.3 cm left periaortic lymph node (image 71; series 2). 1.1 cm aortocaval node  (image 80; series 2). There is a 1.0 cm porta hepatic node (image 65; series 2). Reproductive: Prior hysterectomy. Adnexal structures are unremarkable. Other: None. Musculoskeletal: Lumbar spine degenerative changes. There is a 6.7 x 5.7 cm lytic soft tissue mass involving the right hemi sacrum (image 93; series 2) with soft tissue involving the sacral spinal canal. There is a 2.4 cm soft tissue lytic lesion within the posterior right ilium (image 92; series 2). Additional lytic lesions within the right ilium. IMPRESSION: 1. Large centrally necrotic right lower lobe mass concerning the possibility of primary pulmonary malignancy. 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 2. Multiple enlarged and necrotic mediastinal and hilar nodes most compatible with nodal metastasis. 3. Enlarged retroperitoneal nodes compatible with metastasis. 4. Innumerable low-attenuation lesions within liver compatible metastasis. 5. Large lytic mass involving the right hemi sacrum as well as smaller lytic lesions involving the right ilium consistent with osseous metastasis. Electronically Signed   By: Lovey Newcomer M.D.   On: 12/17/2018 16:47    EKG:   Orders placed or performed in visit on 10/30/14  . EKG 12-Lead    ASSESSMENT AND PLAN:    1.  Acute hypoxic respiratory failure -  Clinically improving with oxygen at 2 L per nasal cannula with current oxygen saturations in the upper 90s, wean off as tolerated  - Patient will likely need evaluation for possible home oxygen therapy -DuoNebs every 6 hours as needed for shortness of breath/cough  2.    Right lower lobe 6 cm lung mass with central necrosis -chest CT - Demonstrating large right lower lobe mass concerning for primary pulmonary malignancy - Pulmonary service consulted, Dr. Lanney Gins has seen the patient - Dr. Tasia Catchings  consulted  for likely lung cancer with metastasis-patient is familiar to Dr. Tasia Catchings, discussed with Dr. Tasia Catchings recommending ultrasound-guided liver biopsy by interventional radiology.  Consult placed to interventional radiology for ultrasound-guided liver biopsy in a.m. n.p.o. after midnight  3.  Hypokalemia - Patient has received IV and p.o. potassium replacement.  Repeat potassium at 3.2  - Will repeat BMP in the a.m. and monitor potassium level -Telemetry monitoring  4.  Hypercalcemia - Serum calcium is 12.2-after IV fluids calcium trended down to 12.1-12.0, may need IV bisphosphonate.  Follow-up with oncology -We will get ionized calcium level and treat accordingly -IV Zometa given once  5.  Hypertension - Treated with Cozaar -stable    All the records are reviewed and case discussed with Care Management/Social Workerr. Management plans discussed with the patient,she is  in agreement.  Patient said that she would update to the family members and requesting not to talk to her family members at this point of time CODE STATUS: fc   TOTAL TIME TAKING CARE OF THIS PATIENT: 36  minutes.   POSSIBLE D/C IN 2 DAYS, DEPENDING ON CLINICAL CONDITION.  Note: This dictation was prepared with Dragon dictation along with smaller phrase technology. Any transcriptional errors that result from this process are unintentional.   Nicholes Mango M.D on 12/19/2018 at 2:00 PM  Between 7am to 6pm - Pager - [phone] After 6pm go to [email or web address] - password [email or web address] [facility]  Office  [phone]  CC: Primary care physician; Mar Daring, PA-C

## 2018-12-19 NOTE — Progress Notes (Signed)
Initial Nutrition Assessment  DOCUMENTATION CODES:   Not applicable  INTERVENTION:  Recommend liberalizing diet to regular.  Provide Ensure Enlive po BID, each supplement provides 350 kcal and 20 grams of protein. Patient prefers strawberry.  NUTRITION DIAGNOSIS:   Increased nutrient needs related to catabolic illness(lung mass with central necrosis concerning for malignancy) as evidenced by estimated needs.  GOAL:   Patient will meet greater than or equal to 90% of their needs  MONITOR:   PO intake, Supplement acceptance, Labs, Weight trends, I & O's  REASON FOR ASSESSMENT:   Malnutrition Screening Tool    ASSESSMENT:   73 year old female with PMHx of HTN, HLD admitted with acute hypoxic respiratory failure, right lower lobe 6 cm lung mass with central necrosis concerning for malignancy.   Met with patient at bedside. She reports she has had a decreased appetite for 3 weeks. She is unsure what is causing her decreased appetite but reports she is finishing less at meals than she used to. She does not eat a lot of meat in her typical diet and reports her protein intake is likely low. She does enjoy yogurt, cottage cheese, and eggs. She is amenable to drinking Ensure to help meet calorie/protein needs.  Patient reports she has lost about 10 lbs over the last few weeks. Per chart she was 94 kg on 10/14/2018 and is now 86.3 kg (190.2 lbs). She has lost 7.7 kg (8.1% body weight) over the past 2 months, which is significant for time frame.  Medications reviewed and include: allopurinol, folic acid 1 mg daily, Lasix 40 mg IV daily, gabapentin, potassium chloride 20 mEq BID.  Labs reviewed: Potassium 3.2.  NUTRITION - FOCUSED PHYSICAL EXAM:    Most Recent Value  Orbital Region  No depletion  Upper Arm Region  No depletion  Thoracic and Lumbar Region  No depletion  Buccal Region  No depletion  Temple Region  No depletion  Clavicle Bone Region  No depletion  Clavicle and  Acromion Bone Region  Mild depletion  Scapular Bone Region  No depletion  Dorsal Hand  No depletion  Patellar Region  No depletion  Anterior Thigh Region  No depletion  Posterior Calf Region  No depletion  Edema (RD Assessment)  None  Hair  Reviewed  Eyes  Reviewed  Mouth  Reviewed  Skin  Reviewed  Nails  Reviewed     Diet Order:   Diet Order            Diet Heart Room service appropriate? Yes; Fluid consistency: Thin  Diet effective now             EDUCATION NEEDS:   No education needs have been identified at this time  Skin:  Skin Assessment: Reviewed RN Assessment  Last BM:  12/18/2018 per chart  Height:   Ht Readings from Last 1 Encounters:  12/17/18 _0  (1.676 m)   Weight:   Wt Readings from Last 1 Encounters:  12/17/18 86.3 kg   Ideal Body Weight:  59.1 kg  BMI:  Body mass index is 30.7 kg/m.  Estimated Nutritional Needs:   Kcal:  3295-1884  Protein:  95-105 grams  Fluid:  1.8-2 L/day  Willey Blade, MS, RD, LDN Office: [phone] Pager: [phone] After Hours/Weekend Pager: [phone]

## 2018-12-19 NOTE — Progress Notes (Signed)
Pt is tachycardic (hr 120's) & states she feels no improvement in SOB from SVN tx's.  BS are clear & diminished.  Changed SVN to Xopenex Q6 prn.

## 2018-12-19 NOTE — Progress Notes (Signed)
Hematology/Oncology Follow Up Note [facility]  [phone] [phone]  Patient Care Team: Rubye Beach as PCP - General (Family Medicine) Dasher, Rayvon Char, MD as Consulting Physician (Dermatology) Yolonda Kida, MD as Consulting Physician (Cardiology) Dingeldein, Remo Lipps, MD as Consulting Physician (Ophthalmology) Kipp Laurence, MD as Referring Physician (Audiology) Grant Fontana, DC as Referring Physician (Chiropractic Medicine) Watt Climes, PA as Physician Assistant (Physician Assistant)   Name of the patient: Zaineb Nowaczyk  [id number]  10-01-1945   INTERVAL HISTORY Patient sitting in the bed, eating lunch. Reports breathing is better and he feels better as well.   Review of Systems  Constitutional: Positive for appetite change, fatigue and unexpected weight change. Negative for chills and fever.  HENT:   Negative for hearing loss and voice change.   Eyes: Negative for eye problems.  Respiratory: Positive for shortness of breath. Negative for chest tightness and cough.   Cardiovascular: Negative for chest pain.  Gastrointestinal: Negative for abdominal distention, abdominal pain and blood in stool.  Endocrine: Negative for hot flashes.  Genitourinary: Negative for difficulty urinating and frequency.   Musculoskeletal: Positive for back pain. Negative for arthralgias.  Skin: Negative for itching and rash.  Neurological: Negative for extremity weakness.  Hematological: Negative for adenopathy.  Psychiatric/Behavioral: Negative for confusion.      Allergies  Allergen Reactions   Furadantin  [Nitrofurantoin]    Phenobarbital Rash     Past Medical History:  Diagnosis Date   Allergy    Cancer ([place]) 09/2014   Skin Cancer   Hyperlipidemia    Hypertension      Past Surgical History:  Procedure Laterality Date   ABDOMINAL HYSTERECTOMY  1989   due to menorrhagia   COLONOSCOPY     [place]   SKIN CANCER EXCISION  10/2014   on chest; Dr. Evorn Gong   TRIGGER FINGER RELEASE Right 2000   WRIST ARTHROPLASTY Left 1991    Social History   Socioeconomic History   Marital status: Married    Spouse name: Eulas Post   Number of children: 2   Years of education: Not on file   Highest education level: Bachelor's degree (e.g., BA, AB, BS)  Occupational History   Occupation: retired  Scientist, product/process development strain: Not hard at [employer]    Worry: Never true    Inability: Never true   Transportation needs    Medical: No    Non-medical: No  Tobacco Use   Smoking status: Former Smoker    Packs/day: 0.50    Years: 35.00    Pack years: 17.50    Quit date: 05/20/2003    Years since quitting: 15.5   Smokeless tobacco: Never Used  Substance and Sexual Activity   Alcohol use: Yes    Alcohol/week: 14.0 standard drinks    Types: 14 Glasses of wine per week    Comment: occasional; 12/16/18 - states no alcohol since 12/02/18    Drug use: No   Sexual activity: Not on file  Lifestyle   Physical activity    Days per week: 0 days    Minutes per session: 0 min   Stress: Not at all  Relationships   Social connections    Talks on phone: Patient refused    Gets together: Patient refused    Attends religious service: Patient refused    Active member of club or organization: Patient refused  Attends meetings of clubs or organizations: Patient refused    Relationship status: Patient refused   Intimate partner violence    Fear of current or ex partner: Patient refused    Emotionally abused: Patient refused    Physically abused: Patient refused    Forced sexual activity: Patient refused  Other Topics Concern   Not on file  Social History Narrative   Not on file    Family History  Problem Relation Age of Onset   Arthritis Mother    Cerebrovascular Disease Mother    Heart disease Mother    Vascular Disease Mother     Hypertension Father    COPD Father    Throat cancer Father    Melanoma Father    Prostate cancer Father    Skin cancer Brother    Skin cancer Brother    Breast cancer Paternal Aunt 10     Current Facility-Administered Medications:    0.9 %  sodium chloride infusion, 250 mL, Intravenous, PRN, Seals, Theo Dills, NP, Stopped at 12/18/18 3329   acetaminophen (TYLENOL) tablet 650 mg, 650 mg, Oral, Q6H PRN **OR** acetaminophen (TYLENOL) suppository 650 mg, 650 mg, Rectal, Q6H PRN, Seals, Angela H, NP   albuterol (PROVENTIL) (2.5 MG/3ML) 0.083% nebulizer solution 2.5 mg, 2.5 mg, Inhalation, Q6H PRN, Seals, Angela H, NP   albuterol (PROVENTIL) (2.5 MG/3ML) 0.083% nebulizer solution 2.5 mg, 2.5 mg, Nebulization, TID, Gouru, Aruna, MD, 2.5 mg at 12/19/18 0727   allopurinol (ZYLOPRIM) tablet 300 mg, 300 mg, Oral, Daily, Earlie Server, MD, 300 mg at 12/19/18 0950   enoxaparin (LOVENOX) injection 40 mg, 40 mg, Subcutaneous, Q24H, Seals, Angela H, NP, 40 mg at 12/19/18 0449   feeding supplement (ENSURE ENLIVE) (ENSURE ENLIVE) liquid 237 mL, 237 mL, Oral, BID BM, Gouru, Aruna, MD   fluticasone (FLONASE) 50 MCG/ACT nasal spray 2 spray, 2 spray, Each Nare, Daily, Seals, Theo Dills, NP, 2 spray at 51/88/41 6606   folic acid (FOLVITE) tablet 1 mg, 1 mg, Oral, Daily, Seals, Angela H, NP, 1 mg at 12/19/18 0950   furosemide (LASIX) injection 40 mg, 40 mg, Intravenous, Daily, Ottie Glazier, MD, 40 mg at 12/19/18 0950   gabapentin (NEURONTIN) capsule 300 mg, 300 mg, Oral, BID, Earlie Server, MD, 300 mg at 12/19/18 0950   guaiFENesin-dextromethorphan (ROBITUSSIN DM) 100-10 MG/5ML syrup 5 mL, 5 mL, Oral, Q4H PRN, Gouru, Aruna, MD, 5 mL at 12/18/18 2321   loratadine (CLARITIN) tablet 10 mg, 10 mg, Oral, Daily, Seals, Angela H, NP, 10 mg at 12/19/18 3016   MEDLINE mouth rinse, 15 mL, Mouth Rinse, BID, Seals, Angela H, NP, 15 mL at 12/19/18 0954   metoprolol succinate (TOPROL-XL) 24 hr tablet 25 mg, 25  mg, Oral, Daily, Seals, Angela H, NP, 25 mg at 12/19/18 0950   montelukast (SINGULAIR) tablet 10 mg, 10 mg, Oral, Daily, Seals, Angela H, NP, 10 mg at 12/19/18 0950   ondansetron (ZOFRAN) tablet 4 mg, 4 mg, Oral, Q6H PRN **OR** ondansetron (ZOFRAN) injection 4 mg, 4 mg, Intravenous, Q6H PRN, Seals, Angela H, NP   oxyCODONE (Oxy IR/ROXICODONE) immediate release tablet 5 mg, 5 mg, Oral, Q8H PRN, Earlie Server, MD, 5 mg at 12/19/18 0839   polyethylene glycol (MIRALAX / GLYCOLAX) packet 17 g, 17 g, Oral, Daily PRN, Seals, Angela H, NP   potassium chloride (KLOR-CON) packet 20 mEq, 20 mEq, Oral, BID, Seals, Angela H, NP, 20 mEq at 12/19/18 0950   sodium chloride flush (NS) 0.9 % injection 3 mL, 3 mL,  Intravenous, Q12H, Seals, Angela H, NP, 3 mL at 12/19/18 0951   sodium chloride flush (NS) 0.9 % injection 3 mL, 3 mL, Intravenous, PRN, Mayer Camel, NP  Physical exam:  Vitals:   12/18/18 1557 12/18/18 2002 12/18/18 2043 12/19/18 0445  BP: 108/71  131/70 116/74  Pulse: (!) 114  (!) 109 (!) 124  Resp:   20 19  Temp: (!) 97.3 F (36.3 C)  98.6 F (37 C) 98.3 F (36.8 C)  TempSrc: Oral     SpO2: 94% 96% 97% 98%  Weight:      Height:       Physical Exam Constitutional:      General: She is not in acute distress.    Appearance: She is not diaphoretic.  HENT:     Head: Normocephalic and atraumatic.  Eyes:     General: No scleral icterus.    Extraocular Movements: Extraocular movements intact.     Pupils: Pupils are equal, round, and reactive to light.  Neck:     Musculoskeletal: Normal range of motion and neck supple.  Cardiovascular:     Rate and Rhythm: Normal rate and regular rhythm.     Heart sounds: Normal heart sounds.  Pulmonary:     Effort: Pulmonary effort is normal. No respiratory distress.     Breath sounds: Decreased breath sounds present. No wheezing or rhonchi.     Comments: Off oxygen Chest:     Chest wall: No mass or tenderness.  Abdominal:     General: Bowel  sounds are normal. There is no distension.     Palpations: Abdomen is soft. There is no splenomegaly or mass.     Tenderness: There is no abdominal tenderness.  Musculoskeletal: Normal range of motion.        General: No deformity.     Right lower leg: No edema.  Skin:    General: Skin is warm and dry.     Findings: No erythema or rash.  Neurological:     General: No focal deficit present.     Mental Status: She is alert and oriented to person, place, and time.     Cranial Nerves: No cranial nerve deficit.     Coordination: Coordination normal.  Psychiatric:        Mood and Affect: Mood normal.        Behavior: Behavior normal.        Thought Content: Thought content normal.     CMP Latest Ref Rng & Units 12/19/2018  Glucose 70 - 99 mg/dL 100(H)  BUN 8 - 23 mg/dL 17  Creatinine 0.44 - 1.00 mg/dL 0.45  Sodium 135 - 145 mmol/L 139  Potassium 3.5 - 5.1 mmol/L 3.2(L)  Chloride 98 - 111 mmol/L 105  CO2 22 - 32 mmol/L 27  Calcium 8.9 - 10.3 mg/dL 12.0(H)  Total Protein 6.5 - 8.1 g/dL -  Total Bilirubin 0.3 - 1.2 mg/dL -  Alkaline Phos 38 - 126 U/L -  AST 15 - 41 U/L -  ALT 0 - 44 U/L -   CBC Latest Ref Rng & Units 12/19/2018  WBC 4.0 - 10.5 K/uL 11.2(H)  Hemoglobin 12.0 - 15.0 g/dL 12.9  Hematocrit 36.0 - 46.0 % 39.3  Platelets 150 - 400 K/uL 193    Dg Chest 2 View  Result Date: 12/17/2018 CLINICAL DATA:  History of cancer. Former smoker. Shortness of breath. EXAM: CHEST - 2 VIEW COMPARISON:  03/20/2016.  06/28/2012. FINDINGS: Right hilar fullness. Diffuse bilateral  interstitial prominence. Multiple bilateral pulmonary nodular densities. These findings suggest malignancy with possible right hilar adenopathy, interstitial tumor spread, and metastatic disease. An inflammatory or infectious process including active granulomatous disease could also present in this fashion. Contrast-enhanced chest CT should be considered for further evaluation. No pleural effusion or pneumothorax. Heart  size normal. Thoracic spine scoliosis. IMPRESSION: Right hilar fullness, diffuse bilateral interstitial prominence, and multiple bilateral pulmonary nodular densities. These findings suggest malignancy with possible right hilar adenopathy, interstitial tumor spread, and metastatic disease. An inflammatory or infectious process including active granulomatous disease could also present this fashion. Contrast-enhanced chest CT should be considered for further evaluation. Electronically Signed   By: Marcello Moores  Register   On: 12/17/2018 08:14   Ct Chest W Contrast  Result Date: 12/17/2018 CLINICAL DATA:  Follow-up exam. History of metastasis and lung nodules. EXAM: CT CHEST, ABDOMEN, AND PELVIS WITH CONTRAST TECHNIQUE: Multidetector CT imaging of the chest, abdomen and pelvis was performed following the standard protocol during bolus administration of intravenous contrast. CONTRAST:  181mL OMNIPAQUE IOHEXOL 300 MG/ML  SOLN COMPARISON:  Chest radiograph 12/16/2018; MRI lumbar spine 12/02/2018 FINDINGS: CT CHEST FINDINGS Cardiovascular: Normal heart size. Trace fluid superior pericardial recess. Thoracic aortic vascular calcifications. Mediastinum/Nodes: No axillary lymphadenopathy. There is a 1.9 cm centrally necrotic precarinal node (image 22; series 2). There is a 1.7 cm right hilar node (image 27; series 2). There is a 1.9 cm left hilar node (image 30; series 2). There is a 1.2 cm centrally necrotic nodule along the posterior left pericardium (image 38; series 2). Mild wall thickening of the esophagus. Lungs/Pleura: Central airways are patent. There is a dominant centrally necrotic 6.2 x 5.9 cm mass within the right lower lobe (image 68; series 4). There is a 2.9 cm subpleural left lower lobe nodule (image 138; series 4). There are multiple bilateral pulmonary nodules. Reference 1.7 cm left lower lobe nodule (image 115; series 4). Reference 0.8 cm left upper lobe nodule (image 42; series 4). Reference 1.5 cm right  middle lobe nodule (image 66; series 4). 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 There is a moderate right pleural effusion. Musculoskeletal: No aggressive or acute appearing osseous lesions. CT ABDOMEN PELVIS FINDINGS Hepatobiliary: There are innumerable low-attenuation lesions throughout the left and right hepatic lobes, the majority are subcentimeter in size. Gallbladder is unremarkable. No intrahepatic or extrahepatic biliary ductal dilatation. Pancreas: Unremarkable Spleen: Unremarkable Adrenals/Urinary Tract: Normal adrenal glands. Kidneys enhance symmetrically with contrast. Subcentimeter too small to characterize low-attenuation lesion inferior pole left kidney (image 77; series 2). Urinary bladder is decompressed. Stomach/Bowel: Stool throughout the colon. No abnormal bowel wall thickening or evidence for bowel obstruction. No free fluid or free intraperitoneal air. Normal morphology of the stomach. Vascular/Lymphatic: Normal caliber abdominal aorta. Peripheral calcified atherosclerotic plaque. 1.3 cm left periaortic lymph node (image 71; series 2). 1.1 cm aortocaval node (image 80; series 2). There is a 1.0 cm porta hepatic node (image 65; series 2). Reproductive: Prior hysterectomy. Adnexal structures are unremarkable. Other: None. Musculoskeletal: Lumbar spine degenerative changes. There is a 6.7 x 5.7 cm lytic soft tissue mass involving the right hemi sacrum (image 93; series 2) with soft tissue involving the sacral spinal canal. There is a 2.4 cm soft tissue lytic lesion within the posterior right ilium (image 92; series 2). Additional lytic lesions within the  right ilium. IMPRESSION: 1. Large centrally necrotic right lower lobe mass concerning the possibility of primary pulmonary malignancy. 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 2. Multiple enlarged and necrotic mediastinal and hilar nodes most compatible with nodal metastasis. 3. Enlarged retroperitoneal nodes compatible with metastasis. 4. Innumerable low-attenuation lesions within liver compatible metastasis. 5. Large lytic mass involving the right hemi sacrum as well as smaller lytic lesions involving the right ilium consistent with osseous metastasis. Electronically Signed   By: Lovey Newcomer M.D.   On: 12/17/2018 16:47   Mr Lumbar Spine Wo Contrast  Result Date: 12/03/2018 CLINICAL DATA:  Sciatica right side. Right back and leg pain 4-5 months. EXAM: MRI LUMBAR SPINE WITHOUT CONTRAST TECHNIQUE: Multiplanar, multisequence MR imaging of the lumbar spine was performed. No intravenous contrast was administered. COMPARISON:  None. FINDINGS: Segmentation:  Normal Alignment:  4 mm anterolisthesis L4-5.  Mild dextroscoliosis. Vertebrae: 1 cm hyperintense lesion in the left posterior T12 vertebral body best seen on inversion recovery. Infiltrating mass lesion in the right sacrum involving the right sacrum. Tumor involves the right S1 vertebral body and pedicle and extends into the right S2 vertebral body. Tumor is low signal on T1 and hyperintense on T2. Extraosseous tumor with narrowing the foramen at L5-S1 and S1-2. Tumor also infiltrates the posterior iliac bone adjacent to the SI joint. Small right SI joint effusion. Conus medullaris and cauda equina: Conus extends to the L1-2 level. Conus and cauda equina appear normal. Paraspinal and other soft tissues: Negative for retroperitoneal adenopathy. Disc levels: L1-2: Mild disc and facet degeneration without stenosis L2-3: Moderate disc degeneration with disc bulging and spurring. Bilateral facet hypertrophy and mild spinal stenosis. L3-4: Disc degeneration and  spondylosis. Bilateral facet hypertrophy. Moderate spinal stenosis. Mild to moderate subarticular stenosis bilaterally L4-5: 4 mm anterolisthesis. Diffuse bulging of the disc with moderate to severe facet degeneration. Moderate to severe spinal stenosis. Mild subarticular stenosis bilaterally L5-S1: Bilateral facet hypertrophy. Right foraminal encroachment due to tumor extension and spurring. 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 Tumor also infiltrates right posterior iliac bone. 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 Right L5 and S1 foraminal encroachment due to tumor extension into the soft tissues. Small lesion T12 also supportive of metastatic disease. Chondrosarcoma or lymphoma right sacrum less likely. These results were called by telephone at the time of interpretation on 12/03/2018 at 10:27 am to [facility] , who verbally acknowledged these results. Electronically Signed   By: Franchot Gallo M.D.   On: 12/03/2018 10:36   Ct Abdomen Pelvis W Contrast  Result Date: 12/17/2018 CLINICAL DATA:  Follow-up exam. History of metastasis and lung nodules. EXAM: CT CHEST, ABDOMEN, AND PELVIS WITH CONTRAST TECHNIQUE: Multidetector CT imaging of the chest, abdomen and pelvis was performed following the standard protocol during bolus administration of intravenous contrast. CONTRAST:  132mL OMNIPAQUE IOHEXOL 300 MG/ML  SOLN COMPARISON:  Chest radiograph 12/16/2018; MRI lumbar spine 12/02/2018 FINDINGS: CT CHEST FINDINGS Cardiovascular: Normal heart size. Trace fluid superior pericardial recess. Thoracic aortic vascular calcifications. Mediastinum/Nodes: No axillary lymphadenopathy. There is a 1.9 cm centrally necrotic precarinal node (image 22; series 2). There is a 1.7 cm right hilar node (image 27; series 2). There is a 1.9 cm left hilar node (image 30; series 2). There is a 1.2 cm centrally necrotic nodule along the posterior  left pericardium (  image 38; series 2). Mild wall thickening of the esophagus. Lungs/Pleura: Central airways are patent. There is a dominant centrally necrotic 6.2 x 5.9 cm mass within the right lower lobe (image 68; series 4). There is a 2.9 cm subpleural left lower lobe nodule (image 138; series 4). There are multiple bilateral pulmonary nodules. Reference 1.7 cm left lower lobe nodule (image 115; series 4). Reference 0.8 cm left upper lobe nodule (image 42; series 4). Reference 1.5 cm right middle lobe nodule (image 66; series 4). 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 There is a moderate right pleural effusion. Musculoskeletal: No aggressive or acute appearing osseous lesions. CT ABDOMEN PELVIS FINDINGS Hepatobiliary: There are innumerable low-attenuation lesions throughout the left and right hepatic lobes, the majority are subcentimeter in size. Gallbladder is unremarkable. No intrahepatic or extrahepatic biliary ductal dilatation. Pancreas: Unremarkable Spleen: Unremarkable Adrenals/Urinary Tract: Normal adrenal glands. Kidneys enhance symmetrically with contrast. Subcentimeter too small to characterize low-attenuation lesion inferior pole left kidney (image 77; series 2). Urinary bladder is decompressed. Stomach/Bowel: Stool throughout the colon. No abnormal bowel wall thickening or evidence for bowel obstruction. No free fluid or free intraperitoneal air. Normal morphology of the stomach. Vascular/Lymphatic: Normal caliber abdominal aorta. Peripheral calcified atherosclerotic plaque. 1.3 cm left periaortic lymph node (image 71; series 2). 1.1 cm aortocaval node (image 80; series 2). There is a 1.0 cm porta hepatic node (image 65; series 2). Reproductive: Prior hysterectomy. Adnexal structures are unremarkable.  Other: None. Musculoskeletal: Lumbar spine degenerative changes. There is a 6.7 x 5.7 cm lytic soft tissue mass involving the right hemi sacrum (image 93; series 2) with soft tissue involving the sacral spinal canal. There is a 2.4 cm soft tissue lytic lesion within the posterior right ilium (image 92; series 2). Additional lytic lesions within the right ilium. IMPRESSION: 1. Large centrally necrotic right lower lobe mass concerning the possibility of primary pulmonary malignancy. 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 2. Multiple enlarged and necrotic mediastinal and hilar nodes most compatible with nodal metastasis. 3. Enlarged retroperitoneal nodes compatible with metastasis. 4. Innumerable low-attenuation lesions within liver compatible metastasis. 5. Large lytic mass involving the right hemi sacrum as well as smaller lytic lesions involving the right ilium consistent with osseous metastasis. Electronically Signed   By: Lovey Newcomer M.D.   On: 12/17/2018 16:47     Assessment and plan  1. Hypoxia   2. Lung mass   3. Metastatic malignant neoplasm, unspecified site ([place])   4. Hyperuricemia   5. Hypercalcemia   6. Acute bilateral low back pain with bilateral sciatica    #Likely metastatic lung cancer with extensive lymphadenopathy, liver and bone involvement. CT images were independently reviewed by me and discussed with patient. Plan liver biopsy by interventional radiology tomorrow.  N.p.o. overnight.Marland Kitchen  #Acute hypoxic respiratory failure, likely due to lung mass, multiple nodules in the lymphangitic spread, right pleural effusion and compression atelectasis.    Continue nebulizers, incentive spirometer. Continue diuresis for another day.  Currently off oxygen.  #Hypercalcemia, likely secondary to malignancy. Status post Zometa 4 mg x 1  yesterday.  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  She also had a decrease of kidney function potentially secondary to hyperuricemia. S/p  Rasburicase x  1,  Continue allopurinol 300 mg daily. Kidney function improved with IV hydration.  #Lower back hip pain, exacerbated with motion.  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 On gabapentin 300 mg BID. Add oxycodone 5 mg every 8 hours as needed. Consider add steroids for bone pain after biopsy is done. Need outpatient palliative radiation  #Transaminitis, due to malignancy.   #Hypokalemia potassium was repleted.  Today potassium improved to 3.2. #Goal of care, we discussed briefly that her condition is most likely incurable.  Awaiting tissue diagnosis.  Recommend check ambulatory oxygen, home oxygen if meeting criteria.  Earlie Server, MD, PhD Hematology Oncology [facility] at [facility] Pager- [phone] 12/19/2018

## 2018-12-19 NOTE — Progress Notes (Signed)
Dr Margaretmary Eddy notified that patient has elevated heart rate. MD ordered lopressor 5 mg IV every 6 hours prn and 12 lead EKG

## 2018-12-19 NOTE — Progress Notes (Signed)
Pulmonary Medicine          Date: 12/19/2018,   MRN# [id number] Elizabeth [name] 06-06-1945     AdmissionWeight: 86.3 kg                 CurrentWeight: 86.3 kg      CHIEF COMPLAINT:   Lung mass   SUBJECTIVE   Patient states she feels better.  She is using IS at bedside and MetaNeb q6h.  She has diuresed well with lasix overnight. Currently on 2L [place] and I think we can wean that down more.   There is plan for Liver biopsy tommorow.  Pulmonary will sign off at this time and am available again as needed. Thank you for allowing me to help this kind lady.   PAST MEDICAL HISTORY   Past Medical History:  Diagnosis Date   Allergy    Cancer ([place]) 09/2014   Skin Cancer   Hyperlipidemia    Hypertension      SURGICAL HISTORY   Past Surgical History:  Procedure Laterality Date   ABDOMINAL HYSTERECTOMY  1989   due to menorrhagia   COLONOSCOPY     DILATION AND CURETTAGE OF UTERUS  1977   SKIN CANCER EXCISION  10/2014   on chest; Dr. Evorn Gong   TRIGGER FINGER RELEASE Right 2000   WRIST ARTHROPLASTY Left 1991     FAMILY HISTORY   Family History  Problem Relation Age of Onset   Arthritis Mother    Cerebrovascular Disease Mother    Heart disease Mother    Vascular Disease Mother    Hypertension Father    COPD Father    Throat cancer Father    Melanoma Father    Prostate cancer Father    Skin cancer Brother    Skin cancer Brother    Breast cancer Paternal Aunt 18     SOCIAL HISTORY   Social History   Tobacco Use   Smoking status: Former Smoker    Packs/day: 0.50    Years: 35.00    Pack years: 17.50    Quit date: 05/20/2003    Years since quitting: 15.5   Smokeless tobacco: Never Used  Substance Use Topics   Alcohol use: Yes    Alcohol/week: 14.0 standard drinks    Types: 14 Glasses of wine per week    Comment: occasional; 12/16/18 - states no alcohol since 12/02/18    Drug use: No     MEDICATIONS    Home Medication:      Current Medication:  Current Facility-Administered Medications:    0.9 %  sodium chloride infusion, 250 mL, Intravenous, PRN, Seals, Theo Dills, NP, Stopped at 12/18/18 7253   acetaminophen (TYLENOL) tablet 650 mg, 650 mg, Oral, Q6H PRN **OR** acetaminophen (TYLENOL) suppository 650 mg, 650 mg, Rectal, Q6H PRN, Seals, Angela H, NP   albuterol (PROVENTIL) (2.5 MG/3ML) 0.083% nebulizer solution 2.5 mg, 2.5 mg, Inhalation, Q6H PRN, Seals, Angela H, NP   albuterol (PROVENTIL) (2.5 MG/3ML) 0.083% nebulizer solution 2.5 mg, 2.5 mg, Nebulization, TID, Gouru, Aruna, MD, 2.5 mg at 12/19/18 0727   allopurinol (ZYLOPRIM) tablet 300 mg, 300 mg, Oral, Daily, Earlie Server, MD, 300 mg at 12/19/18 0950   enoxaparin (LOVENOX) injection 40 mg, 40 mg, Subcutaneous, Q24H, Seals, Angela H, NP, 40 mg at 12/19/18 0449   fluticasone (FLONASE) 50 MCG/ACT nasal spray 2 spray, 2 spray, Each Nare, Daily, Seals, Angela H, NP, 2 spray at 66/44/03 4742   folic acid (FOLVITE) tablet  1 mg, 1 mg, Oral, Daily, Seals, Angela H, NP, 1 mg at 12/19/18 0950   furosemide (LASIX) injection 40 mg, 40 mg, Intravenous, Daily, Ottie Glazier, MD, 40 mg at 12/19/18 0950   gabapentin (NEURONTIN) capsule 300 mg, 300 mg, Oral, BID, Earlie Server, MD, 300 mg at 12/19/18 0950   guaiFENesin-dextromethorphan (ROBITUSSIN DM) 100-10 MG/5ML syrup 5 mL, 5 mL, Oral, Q4H PRN, Gouru, Aruna, MD, 5 mL at 12/18/18 2321   loratadine (CLARITIN) tablet 10 mg, 10 mg, Oral, Daily, Seals, Angela H, NP, 10 mg at 12/19/18 8676   MEDLINE mouth rinse, 15 mL, Mouth Rinse, BID, Seals, Theo Dills, NP, 15 mL at 12/19/18 0954   metoprolol succinate (TOPROL-XL) 24 hr tablet 25 mg, 25 mg, Oral, Daily, Seals, Angela H, NP, 25 mg at 12/19/18 0950   montelukast (SINGULAIR) tablet 10 mg, 10 mg, Oral, Daily, Seals, Angela H, NP, 10 mg at 12/19/18 0950   ondansetron (ZOFRAN) tablet 4 mg, 4 mg, Oral, Q6H PRN **OR** ondansetron (ZOFRAN) injection 4 mg, 4 mg, Intravenous, Q6H PRN,  Seals, Angela H, NP   oxyCODONE (Oxy IR/ROXICODONE) immediate release tablet 5 mg, 5 mg, Oral, Q8H PRN, Earlie Server, MD, 5 mg at 12/19/18 7209   polyethylene glycol (MIRALAX / GLYCOLAX) packet 17 g, 17 g, Oral, Daily PRN, Seals, Angela H, NP   potassium chloride (KLOR-CON) packet 20 mEq, 20 mEq, Oral, BID, Seals, Angela H, NP, 20 mEq at 12/19/18 0950   sodium chloride flush (NS) 0.9 % injection 3 mL, 3 mL, Intravenous, Q12H, Seals, Angela H, NP, 3 mL at 12/19/18 0951   sodium chloride flush (NS) 0.9 % injection 3 mL, 3 mL, Intravenous, PRN, Seals, Theo Dills, NP    ALLERGIES   Furadantin  [nitrofurantoin] and Phenobarbital     REVIEW OF SYSTEMS    Review of Systems:  Gen:  Denies  fever, sweats, chills weigh loss  HEENT: Denies blurred vision, double vision, ear pain, eye pain, hearing loss, nose bleeds, sore throat Cardiac:  No dizziness, chest pain or heaviness, chest tightness,edema Resp:   Denies cough or sputum porduction, shortness of breath,wheezing, hemoptysis,  Gi: Denies swallowing difficulty, stomach pain, nausea or vomiting, diarrhea, constipation, bowel incontinence Gu:  Denies bladder incontinence, burning urine Ext:   Denies Joint pain, stiffness or swelling Skin: Denies  skin rash, easy bruising or bleeding or hives Endoc:  Denies polyuria, polydipsia , polyphagia or weight change Psych:   Denies depression, insomnia or hallucinations   Other:  All other systems negative   VS: BP 116/74 (BP Location: Right Arm)    Pulse (!) 124    Temp 98.3 F (36.8 C)    Resp 19    Ht 5\' 6"  (1.676 m)    Wt 86.3 kg    SpO2 98%    BMI 30.70 kg/m      PHYSICAL EXAM    GENERAL:NAD, no fevers, chills, no weakness no fatigue HEAD: Normocephalic, atraumatic.  EYES: Pupils equal, round, reactive to light. Extraocular muscles intact. No scleral icterus.  MOUTH: Moist mucosal membrane. Dentition intact. No abscess noted.  EAR, NOSE, THROAT: Clear without exudates. No external  lesions.  NECK: Supple. No thyromegaly. No nodules. No JVD.  PULMONARY: decreased breath sounds on right.  CARDIOVASCULAR: S1 and S2. Regular rate and rhythm. No murmurs, rubs, or gallops. No edema. Pedal pulses 2+ bilaterally.  GASTROINTESTINAL: Soft, nontender, nondistended. No masses. Positive bowel sounds. No hepatosplenomegaly.  MUSCULOSKELETAL: No swelling, clubbing, or edema. Range of motion full  in all extremities.  NEUROLOGIC: Cranial nerves II through XII are intact. No gross focal neurological deficits. Sensation intact. Reflexes intact.  SKIN: No ulceration, lesions, rashes, or cyanosis. Skin warm and dry. Turgor intact.  PSYCHIATRIC: Mood, affect within normal limits. The patient is awake, alert and oriented x 3. Insight, judgment intact.       IMAGING    Dg Chest 2 View  Result Date: 12/17/2018 CLINICAL DATA:  History of cancer. Former smoker. Shortness of breath. EXAM: CHEST - 2 VIEW COMPARISON:  03/20/2016.  06/28/2012. FINDINGS: Right hilar fullness. Diffuse bilateral interstitial prominence. Multiple bilateral pulmonary nodular densities. These findings suggest malignancy with possible right hilar adenopathy, interstitial tumor spread, and metastatic disease. An inflammatory or infectious process including active granulomatous disease could also present in this fashion. Contrast-enhanced chest CT should be considered for further evaluation. No pleural effusion or pneumothorax. Heart size normal. Thoracic spine scoliosis. IMPRESSION: Right hilar fullness, diffuse bilateral interstitial prominence, and multiple bilateral pulmonary nodular densities. These findings suggest malignancy with possible right hilar adenopathy, interstitial tumor spread, and metastatic disease. An inflammatory or infectious process including active granulomatous disease could also present this fashion. Contrast-enhanced chest CT should be considered for further evaluation. Electronically Signed   By: Marcello Moores   Register   On: 12/17/2018 08:14   Ct Chest W Contrast  Result Date: 12/17/2018 CLINICAL DATA:  Follow-up exam. History of metastasis and lung nodules. EXAM: CT CHEST, ABDOMEN, AND PELVIS WITH CONTRAST TECHNIQUE: Multidetector CT imaging of the chest, abdomen and pelvis was performed following the standard protocol during bolus administration of intravenous contrast. CONTRAST:  112mL OMNIPAQUE IOHEXOL 300 MG/ML  SOLN COMPARISON:  Chest radiograph 12/16/2018; MRI lumbar spine 12/02/2018 FINDINGS: CT CHEST FINDINGS Cardiovascular: Normal heart size. Trace fluid superior pericardial recess. Thoracic aortic vascular calcifications. Mediastinum/Nodes: No axillary lymphadenopathy. There is a 1.9 cm centrally necrotic precarinal node (image 22; series 2). There is a 1.7 cm right hilar node (image 27; series 2). There is a 1.9 cm left hilar node (image 30; series 2). There is a 1.2 cm centrally necrotic nodule along the posterior left pericardium (image 38; series 2). Mild wall thickening of the esophagus. Lungs/Pleura: Central airways are patent. There is a dominant centrally necrotic 6.2 x 5.9 cm mass within the right lower lobe (image 68; series 4). There is a 2.9 cm subpleural left lower lobe nodule (image 138; series 4). There are multiple bilateral pulmonary nodules. Reference 1.7 cm left lower lobe nodule (image 115; series 4). Reference 0.8 cm left upper lobe nodule (image 42; series 4). Reference 1.5 cm right middle lobe nodule (image 66; series 4). 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 There is a moderate right pleural effusion. Musculoskeletal: No aggressive or acute appearing osseous lesions. CT ABDOMEN PELVIS FINDINGS Hepatobiliary: There are innumerable low-attenuation lesions throughout the left and right  hepatic lobes, the majority are subcentimeter in size. Gallbladder is unremarkable. No intrahepatic or extrahepatic biliary ductal dilatation. Pancreas: Unremarkable Spleen: Unremarkable Adrenals/Urinary Tract: Normal adrenal glands. Kidneys enhance symmetrically with contrast. Subcentimeter too small to characterize low-attenuation lesion inferior pole left kidney (image 77; series 2). Urinary bladder is decompressed. Stomach/Bowel: Stool throughout the colon. No abnormal bowel wall thickening or evidence for bowel obstruction. No free fluid or free intraperitoneal air. Normal morphology of the stomach. Vascular/Lymphatic: Normal caliber abdominal aorta.  Peripheral calcified atherosclerotic plaque. 1.3 cm left periaortic lymph node (image 71; series 2). 1.1 cm aortocaval node (image 80; series 2). There is a 1.0 cm porta hepatic node (image 65; series 2). Reproductive: Prior hysterectomy. Adnexal structures are unremarkable. Other: None. Musculoskeletal: Lumbar spine degenerative changes. There is a 6.7 x 5.7 cm lytic soft tissue mass involving the right hemi sacrum (image 93; series 2) with soft tissue involving the sacral spinal canal. There is a 2.4 cm soft tissue lytic lesion within the posterior right ilium (image 92; series 2). Additional lytic lesions within the right ilium. IMPRESSION: 1. Large centrally necrotic right lower lobe mass concerning the possibility of primary pulmonary malignancy. 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 2. Multiple enlarged and necrotic mediastinal and hilar nodes most compatible with nodal metastasis. 3. Enlarged retroperitoneal nodes compatible with metastasis. 4. Innumerable low-attenuation lesions within liver compatible metastasis. 5. Large lytic mass involving the right hemi sacrum as well as smaller lytic lesions  involving the right ilium consistent with osseous metastasis. Electronically Signed   By: Lovey Newcomer M.D.   On: 12/17/2018 16:47   Mr Lumbar Spine Wo Contrast  Result Date: 12/03/2018 CLINICAL DATA:  Sciatica right side. Right back and leg pain 4-5 months. EXAM: MRI LUMBAR SPINE WITHOUT CONTRAST TECHNIQUE: Multiplanar, multisequence MR imaging of the lumbar spine was performed. No intravenous contrast was administered. COMPARISON:  None. FINDINGS: Segmentation:  Normal Alignment:  4 mm anterolisthesis L4-5.  Mild dextroscoliosis. Vertebrae: 1 cm hyperintense lesion in the left posterior T12 vertebral body best seen on inversion recovery. Infiltrating mass lesion in the right sacrum involving the right sacrum. Tumor involves the right S1 vertebral body and pedicle and extends into the right S2 vertebral body. Tumor is low signal on T1 and hyperintense on T2. Extraosseous tumor with narrowing the foramen at L5-S1 and S1-2. Tumor also infiltrates the posterior iliac bone adjacent to the SI joint. Small right SI joint effusion. Conus medullaris and cauda equina: Conus extends to the L1-2 level. Conus and cauda equina appear normal. Paraspinal and other soft tissues: Negative for retroperitoneal adenopathy. Disc levels: L1-2: Mild disc and facet degeneration without stenosis L2-3: Moderate disc degeneration with disc bulging and spurring. Bilateral facet hypertrophy and mild spinal stenosis. L3-4: Disc degeneration and spondylosis. Bilateral facet hypertrophy. Moderate spinal stenosis. Mild to moderate subarticular stenosis bilaterally L4-5: 4 mm anterolisthesis. Diffuse bulging of the disc with moderate to severe facet degeneration. Moderate to severe spinal stenosis. Mild subarticular stenosis bilaterally L5-S1: Bilateral facet hypertrophy. Right foraminal encroachment due to tumor extension and spurring. 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 Tumor  also infiltrates right posterior iliac bone. 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 Right L5 and S1 foraminal encroachment due to tumor extension into the soft tissues. Small lesion T12 also supportive of metastatic disease. Chondrosarcoma or lymphoma right sacrum less likely. These results were called by telephone at the time of interpretation on 12/03/2018 at 10:27 am to [facility] , who verbally acknowledged these results. Electronically Signed   By: Franchot Gallo M.D.   On: 12/03/2018 10:36   Ct Abdomen Pelvis W Contrast  Result Date: 12/17/2018 CLINICAL DATA:  Follow-up exam. History of metastasis and lung nodules. EXAM: CT CHEST, ABDOMEN, AND PELVIS WITH CONTRAST TECHNIQUE: Multidetector CT imaging of the chest, abdomen and pelvis was performed following  the standard protocol during bolus administration of intravenous contrast. CONTRAST:  124mL OMNIPAQUE IOHEXOL 300 MG/ML  SOLN COMPARISON:  Chest radiograph 12/16/2018; MRI lumbar spine 12/02/2018 FINDINGS: CT CHEST FINDINGS Cardiovascular: Normal heart size. Trace fluid superior pericardial recess. Thoracic aortic vascular calcifications. Mediastinum/Nodes: No axillary lymphadenopathy. There is a 1.9 cm centrally necrotic precarinal node (image 22; series 2). There is a 1.7 cm right hilar node (image 27; series 2). There is a 1.9 cm left hilar node (image 30; series 2). There is a 1.2 cm centrally necrotic nodule along the posterior left pericardium (image 38; series 2). Mild wall thickening of the esophagus. Lungs/Pleura: Central airways are patent. There is a dominant centrally necrotic 6.2 x 5.9 cm mass within the right lower lobe (image 68; series 4). There is a 2.9 cm subpleural left lower lobe nodule (image 138; series 4). There are multiple bilateral pulmonary nodules. Reference 1.7 cm left lower lobe nodule (image 115; series 4). Reference 0.8 cm left upper lobe nodule (image 42; series 4). Reference 1.5 cm right middle  lobe nodule (image 66; series 4). 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 There is a moderate right pleural effusion. Musculoskeletal: No aggressive or acute appearing osseous lesions. CT ABDOMEN PELVIS FINDINGS Hepatobiliary: There are innumerable low-attenuation lesions throughout the left and right hepatic lobes, the majority are subcentimeter in size. Gallbladder is unremarkable. No intrahepatic or extrahepatic biliary ductal dilatation. Pancreas: Unremarkable Spleen: Unremarkable Adrenals/Urinary Tract: Normal adrenal glands. Kidneys enhance symmetrically with contrast. Subcentimeter too small to characterize low-attenuation lesion inferior pole left kidney (image 77; series 2). Urinary bladder is decompressed. Stomach/Bowel: Stool throughout the colon. No abnormal bowel wall thickening or evidence for bowel obstruction. No free fluid or free intraperitoneal air. Normal morphology of the stomach. Vascular/Lymphatic: Normal caliber abdominal aorta. Peripheral calcified atherosclerotic plaque. 1.3 cm left periaortic lymph node (image 71; series 2). 1.1 cm aortocaval node (image 80; series 2). There is a 1.0 cm porta hepatic node (image 65; series 2). Reproductive: Prior hysterectomy. Adnexal structures are unremarkable. Other: None. Musculoskeletal: Lumbar spine degenerative changes. There is a 6.7 x 5.7 cm lytic soft tissue mass involving the right hemi sacrum (image 93; series 2) with soft tissue involving the sacral spinal canal. There is a 2.4 cm soft tissue lytic lesion within the posterior right ilium (image 92; series 2). Additional lytic lesions within the right ilium. IMPRESSION: 1. Large centrally necrotic right lower lobe mass concerning the possibility of primary pulmonary malignancy. 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 2. Multiple enlarged and necrotic mediastinal and hilar nodes most compatible with nodal metastasis. 3. Enlarged retroperitoneal nodes compatible with metastasis. 4. Innumerable low-attenuation lesions within liver compatible metastasis. 5. Large lytic mass involving the right hemi sacrum as well as smaller lytic lesions involving the right ilium consistent with osseous metastasis. Electronically Signed   By: Lovey Newcomer M.D.   On: 12/17/2018 16:47      ASSESSMENT/PLAN   Acute hypoxemic respiratory failure  - likely due to right lung mass with multiple nodules and lymphangitic spread complicated by right pleural effusion and mild compressive atelectasis  - Ultrasound asessment - small effusion unlikely to help with SOB and hypoxemia and may not be enough to  give diagnosis for malignancy  - will diurese with lasix once daily to reduce effusion, will repeat uric acid as lasix tends to increase urate levels and in the interim will order Rasburicase X1 , no hx of G6PD deficiency  - will initiate aggressive bronchopulmonary hygiene to recruit atelectatic segments in attempt to wean off of oxygen  - starting IS at bedside and MetaNeb q6h with saline   Right lower lobe 6cm lung mass with central necrosis  - 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   Patient/Family are satisfied with care plan and all questions have been answered.  This document was prepared using Dragon voice recognition software and may include unintentional dictation errors.     Ottie Glazier, M.D.  Division of [facility]

## 2018-12-20 ENCOUNTER — Inpatient Hospital Stay: Payer: Medicare Other

## 2018-12-20 ENCOUNTER — Encounter: Payer: Self-pay | Admitting: Radiology

## 2018-12-20 ENCOUNTER — Inpatient Hospital Stay
Admit: 2018-12-20 | Discharge: 2018-12-20 | Disposition: A | Discharge status: Home / Self Care | Payer: Medicare Other | Attending: Internal Medicine | Admitting: Internal Medicine

## 2018-12-20 ENCOUNTER — Encounter: Payer: Medicare Other | Admitting: Physician Assistant

## 2018-12-20 DIAGNOSIS — I2699 Other pulmonary embolism without acute cor pulmonale: Secondary | ICD-10-CM

## 2018-12-20 LAB — ECHOCARDIOGRAM COMPLETE
Height: 66 in
Weight: 3043.2 oz

## 2018-12-20 LAB — MULTIPLE MYELOMA PANEL, SERUM
Albumin SerPl Elph-Mcnc: 3.3 g/dL (ref 2.9–4.4)
Albumin/Glob SerPl: 1.1 (ref 0.7–1.7)
Alpha 1: 0.4 g/dL (ref 0.0–0.4)
Alpha2 Glob SerPl Elph-Mcnc: 1.1 g/dL — ABNORMAL HIGH (ref 0.4–1.0)
B-Globulin SerPl Elph-Mcnc: 1 g/dL (ref 0.7–1.3)
Gamma Glob SerPl Elph-Mcnc: 0.7 g/dL (ref 0.4–1.8)
Globulin, Total: 3.2 g/dL (ref 2.2–3.9)
IgA: 300 mg/dL (ref 64–422)
IgG (Immunoglobin G), Serum: 836 mg/dL (ref 586–1602)
IgM (Immunoglobulin M), Srm: 110 mg/dL (ref 26–217)
Total Protein ELP: 6.5 g/dL (ref 6.0–8.5)

## 2018-12-20 LAB — URINALYSIS, ROUTINE W REFLEX MICROSCOPIC
Bilirubin Urine: NEGATIVE
Glucose, UA: NEGATIVE mg/dL
Hgb urine dipstick: NEGATIVE
Ketones, ur: NEGATIVE mg/dL
Leukocytes,Ua: NEGATIVE
Nitrite: NEGATIVE
Protein, ur: NEGATIVE mg/dL
Specific Gravity, Urine: 1.025 (ref 1.005–1.030)
pH: 6 (ref 5.0–8.0)

## 2018-12-20 LAB — COMP PANEL: LEUKEMIA/LYMPHOMA

## 2018-12-20 LAB — CBC
HCT: 38.9 % (ref 36.0–46.0)
Hemoglobin: 12.8 g/dL (ref 12.0–15.0)
MCH: 30.4 pg (ref 26.0–34.0)
MCHC: 32.9 g/dL (ref 30.0–36.0)
MCV: 92.4 fL (ref 80.0–100.0)
Platelets: 189 10*3/uL (ref 150–400)
RBC: 4.21 MIL/uL (ref 3.87–5.11)
RDW: 13.1 % (ref 11.5–15.5)
WBC: 13.4 10*3/uL — ABNORMAL HIGH (ref 4.0–10.5)
nRBC: 0 % (ref 0.0–0.2)

## 2018-12-20 LAB — BASIC METABOLIC PANEL
Anion gap: 8 (ref 5–15)
BUN: 23 mg/dL (ref 8–23)
CO2: 27 mmol/L (ref 22–32)
Calcium: 11.3 mg/dL — ABNORMAL HIGH (ref 8.9–10.3)
Chloride: 103 mmol/L (ref 98–111)
Creatinine, Ser: 0.43 mg/dL — ABNORMAL LOW (ref 0.44–1.00)
GFR calc Af Amer: >60 mL/min (ref >60)
GFR calc non Af Amer: >60 mL/min (ref >60)
Glucose, Bld: 111 mg/dL — ABNORMAL HIGH (ref 70–99)
Potassium: 3.4 mmol/L — ABNORMAL LOW (ref 3.5–5.1)
Sodium: 138 mmol/L (ref 135–145)

## 2018-12-20 LAB — HEPARIN LEVEL (UNFRACTIONATED): Heparin Unfractionated: 0.17 IU/mL — ABNORMAL LOW (ref 0.30–0.70)

## 2018-12-20 LAB — PROTIME-INR
INR: 1.1 (ref 0.8–1.2)
Prothrombin Time: 13.6 seconds (ref 11.4–15.2)

## 2018-12-20 LAB — APTT: aPTT: 33 seconds (ref 24–36)

## 2018-12-20 MED ORDER — LEVALBUTEROL HCL 0.63 MG/3ML IN NEBU
0.6300 mg | INHALATION_SOLUTION | RESPIRATORY_TRACT | Status: AC
  Administered 2018-12-20: 04:00:00 0.63 mg via RESPIRATORY_TRACT

## 2018-12-20 MED ORDER — HEPARIN (PORCINE) 25000 UT/250ML-% IV SOLN: 1200.0000 [IU]/h | INTRAVENOUS | Status: DC

## 2018-12-20 MED ORDER — HEPARIN BOLUS VIA INFUSION
2300.0000 [IU] | Freq: Once | INTRAVENOUS | Status: AC
  Administered 2018-12-20: 2300 [IU] via INTRAVENOUS
  Filled 2018-12-20: qty 2300

## 2018-12-20 MED ORDER — HEPARIN (PORCINE) 25000 UT/250ML-% IV SOLN
2100.0000 [IU]/h | INTRAVENOUS | Status: DC
  Administered 2018-12-20: 1200 [IU]/h via INTRAVENOUS
  Administered 2018-12-21: 1500 [IU]/h via INTRAVENOUS
  Administered 2018-12-21 – 2018-12-22 (×2): 1550 [IU]/h via INTRAVENOUS
  Administered 2018-12-23 – 2018-12-24 (×2): 1850 [IU]/h via INTRAVENOUS
  Filled 2018-12-20 (×7): qty 250

## 2018-12-20 MED ORDER — IOHEXOL 350 MG/ML SOLN
75.0000 mL | Freq: Once | INTRAVENOUS | Status: AC | PRN
  Administered 2018-12-20: 06:00:00 75 mL via INTRAVENOUS

## 2018-12-20 MED ORDER — METOPROLOL TARTRATE 5 MG/5ML IV SOLN
2.5000 mg | Freq: Once | INTRAVENOUS | Status: AC
  Administered 2018-12-20: 2.5 mg via INTRAVENOUS

## 2018-12-20 MED ORDER — METOPROLOL TARTRATE 5 MG/5ML IV SOLN
5.0000 mg | Freq: Once | INTRAVENOUS | Status: AC
  Administered 2018-12-20: 20:00:00 5 mg via INTRAVENOUS
  Filled 2018-12-20: qty 5

## 2018-12-20 NOTE — Progress Notes (Signed)
Heart rate 140. ST on monitor. Pt denies pain or shortness of breath. Resting in bed. Metoprolol 5mg  given IV for heart rate >110.. Will continue to monitor closely.

## 2018-12-20 NOTE — Progress Notes (Signed)
*  PRELIMINARY RESULTS* Echocardiogram 2D Echocardiogram has been performed.  Elizabeth [name] 12/20/2018, 1:55 PM

## 2018-12-20 NOTE — Progress Notes (Signed)
Hematology/Oncology Follow Up Note [facility]  [phone] [phone]  Patient Care Team: Rubye Beach as PCP - General (Family Medicine) Dasher, Rayvon Char, MD as Consulting Physician (Dermatology) Yolonda Kida, MD as Consulting Physician (Cardiology) Dingeldein, Remo Lipps, MD as Consulting Physician (Ophthalmology) Kipp Laurence, MD as Referring Physician (Audiology) Grant Fontana, [place] as Referring Physician (Chiropractic Medicine) Watt Climes, PA as Physician Assistant (Physician Assistant)   Name of the patient: Elizabeth [name]  [id number]  Oct 07, 1945   INTERVAL HISTORY Patient sitting in the bed, breathing via nasal cannula oxygen. She was hypoxic overnight, with increased oxygen demand CT chest angiogram PE protocol showed multifocal segmental and subsegmental acute pulmonary emboli.  No indication of right heart strain.  Redemonstrated extensive thoracic and intra-abdominal malignancy as recently stage. Patient was started on anticoagulation with, heparin drip. Liver biopsy need to be delayed due to PE.   Review of Systems  Constitutional: Positive for appetite change, fatigue and unexpected weight change. Negative for chills and fever.  HENT:   Negative for hearing loss and voice change.   Eyes: Negative for eye problems.  Respiratory: Positive for shortness of breath. Negative for chest tightness and cough.   Cardiovascular: Negative for chest pain.  Gastrointestinal: Negative for abdominal distention, abdominal pain and blood in stool.  Endocrine: Negative for hot flashes.  Genitourinary: Negative for difficulty urinating and frequency.   Musculoskeletal: Positive for back pain. Negative for arthralgias.  Skin: Negative for itching and rash.  Neurological: Negative for extremity weakness.  Hematological: Negative for adenopathy.  Psychiatric/Behavioral: Negative for confusion.      Allergies  Allergen  Reactions   Furadantin  [Nitrofurantoin]    Phenobarbital Rash     Past Medical History:  Diagnosis Date   Allergy    Cancer ([place]) 09/2014   Skin Cancer   Hyperlipidemia    Hypertension      Past Surgical History:  Procedure Laterality Date   ABDOMINAL HYSTERECTOMY  1989   due to menorrhagia   COLONOSCOPY     [place]   SKIN CANCER EXCISION  10/2014   on chest; Dr. Evorn Gong   TRIGGER FINGER RELEASE Right 2000   WRIST ARTHROPLASTY Left 1991    Social History   Socioeconomic History   Marital status: Married    Spouse name: Eulas Post   Number of children: 2   Years of education: Not on file   Highest education level: Bachelor's degree (e.g., BA, AB, BS)  Occupational History   Occupation: retired  Scientist, product/process development strain: Not hard at [employer]    Worry: Never true    Inability: Never true   Transportation needs    Medical: No    Non-medical: No  Tobacco Use   Smoking status: Former Smoker    Packs/day: 0.50    Years: 35.00    Pack years: 17.50    Quit date: 05/20/2003    Years since quitting: 15.5   Smokeless tobacco: Never Used  Substance and Sexual Activity   Alcohol use: Yes    Alcohol/week: 14.0 standard drinks    Types: 14 Glasses of wine per week    Comment: occasional; 12/16/18 - states no alcohol since 12/02/18    Drug use: No   Sexual activity: Not on file  Lifestyle   Physical activity    Days per week: 0 days    Minutes per session: 0  min   Stress: Not at all  Relationships   Social connections    Talks on phone: Patient refused    Gets together: Patient refused    Attends religious service: Patient refused    Active member of club or organization: Patient refused    Attends meetings of clubs or organizations: Patient refused    Relationship status: Patient refused   Intimate partner violence    Fear of current or ex partner: Patient refused    Emotionally  abused: Patient refused    Physically abused: Patient refused    Forced sexual activity: Patient refused  Other Topics Concern   Not on file  Social History Narrative   Not on file    Family History  Problem Relation Age of Onset   Arthritis Mother    Cerebrovascular Disease Mother    Heart disease Mother    Vascular Disease Mother    Hypertension Father    COPD Father    Throat cancer Father    Melanoma Father    Prostate cancer Father    Skin cancer Brother    Skin cancer Brother    Breast cancer Paternal Aunt 71     Current Facility-Administered Medications:    0.9 %  sodium chloride infusion, 250 mL, Intravenous, PRN, Seals, Angela H, NP, Stopped at 12/18/18 4332   acetaminophen (TYLENOL) tablet 650 mg, 650 mg, Oral, Q6H PRN, 650 mg at 12/20/18 0358 **OR** acetaminophen (TYLENOL) suppository 650 mg, 650 mg, Rectal, Q6H PRN, Seals, Angela H, NP   allopurinol (ZYLOPRIM) tablet 300 mg, 300 mg, Oral, Daily, Earlie Server, MD, 300 mg at 12/20/18 9518   feeding supplement (ENSURE ENLIVE) (ENSURE ENLIVE) liquid 237 mL, 237 mL, Oral, BID BM, Gouru, Aruna, MD, Stopped at 12/20/18 0930   fluticasone (FLONASE) 50 MCG/ACT nasal spray 2 spray, 2 spray, Each Nare, Daily, Seals, Angela H, NP, 2 spray at 84/16/60 6301   folic acid (FOLVITE) tablet 1 mg, 1 mg, Oral, Daily, Seals, Angela H, NP, 1 mg at 12/20/18 0926   gabapentin (NEURONTIN) capsule 300 mg, 300 mg, Oral, BID, Earlie Server, MD, 300 mg at 12/20/18 0926   guaiFENesin-dextromethorphan (ROBITUSSIN DM) 100-10 MG/5ML syrup 5 mL, 5 mL, Oral, Q4H PRN, Gouru, Aruna, MD, 5 mL at 12/18/18 2321   heparin ADULT infusion 100 units/mL (25000 units/260mL sodium chloride 0.45%), 1,200 Units/hr, Intravenous, Continuous, Mayo, Pete Pelt, MD, Last Rate: 12 mL/hr at 12/20/18 0815, 1,200 Units/hr at 12/20/18 0815   levalbuterol (XOPENEX) nebulizer solution 0.63 mg, 0.63 mg, Nebulization, Q6H PRN, Gouru, Aruna, MD, 0.63 mg at 12/20/18  0753   loratadine (CLARITIN) tablet 10 mg, 10 mg, Oral, Daily, Seals, Angela H, NP, 10 mg at 12/20/18 6010   MEDLINE mouth rinse, 15 mL, Mouth Rinse, BID, Seals, Angela H, NP, 15 mL at 12/19/18 2111   metoprolol succinate (TOPROL-XL) 24 hr tablet 25 mg, 25 mg, Oral, Daily, Seals, Angela H, NP, 25 mg at 12/20/18 0926   metoprolol tartrate (LOPRESSOR) injection 5 mg, 5 mg, Intravenous, Q6H PRN, Gouru, Aruna, MD, 5 mg at 12/19/18 1752   montelukast (SINGULAIR) tablet 10 mg, 10 mg, Oral, Daily, Seals, Angela H, NP, 10 mg at 12/20/18 9323   morphine 2 MG/ML injection 1 mg, 1 mg, Intravenous, Q4H PRN, Mansy, Jan A, MD, 1 mg at 12/19/18 2351   morphine 2 MG/ML injection 2 mg, 2 mg, Intravenous, Q4H PRN, Mansy, Jan A, MD   ondansetron (ZOFRAN) tablet 4 mg, 4 mg, Oral, Q6H PRN **OR**  ondansetron (ZOFRAN) injection 4 mg, 4 mg, Intravenous, Q6H PRN, Seals, Theo Dills, NP   oxyCODONE (Oxy IR/ROXICODONE) immediate release tablet 5 mg, 5 mg, Oral, Q8H PRN, Earlie Server, MD, 5 mg at 12/20/18 1025   polyethylene glycol (MIRALAX / GLYCOLAX) packet 17 g, 17 g, Oral, Daily PRN, Seals, Theo Dills, NP   potassium chloride (KLOR-CON) packet 20 mEq, 20 mEq, Oral, BID, Seals, Angela H, NP, 20 mEq at 12/20/18 8527   sodium chloride flush (NS) 0.9 % injection 3 mL, 3 mL, Intravenous, Q12H, Seals, Angela H, NP, 3 mL at 12/20/18 0931   sodium chloride flush (NS) 0.9 % injection 3 mL, 3 mL, Intravenous, PRN, Seals, Theo Dills, NP   tiZANidine (ZANAFLEX) tablet 4 mg, 4 mg, Oral, TID PRN, Fritzi Mandes, MD, 4 mg at 12/19/18 2111  Physical exam:  Vitals:   12/20/18 0532 12/20/18 0605 12/20/18 0633 12/20/18 0753  BP:      Pulse: (!) 125     Resp:      Temp: 99.3 F (37.4 C)     TempSrc: Oral     SpO2: (!) 87% (!) 87% 93% 93%  Weight:      Height:       Physical Exam Constitutional:      General: She is not in acute distress.    Appearance: She is not diaphoretic.  HENT:     Head: Normocephalic and atraumatic.    Eyes:     General: No scleral icterus.    Extraocular Movements: Extraocular movements intact.     Pupils: Pupils are equal, round, and reactive to light.  Neck:     Musculoskeletal: Normal range of motion and neck supple.  Cardiovascular:     Rate and Rhythm: Normal rate and regular rhythm.     Heart sounds: Normal heart sounds.  Pulmonary:     Effort: Pulmonary effort is normal. No respiratory distress.     Breath sounds: Decreased breath sounds present. No wheezing or rhonchi.     Comments: Breathing via nasal cannula oxygen. Chest:     Chest wall: No mass or tenderness.  Abdominal:     General: Bowel sounds are normal. There is no distension.     Palpations: Abdomen is soft. There is no splenomegaly or mass.     Tenderness: There is no abdominal tenderness.  Musculoskeletal: Normal range of motion.        General: No deformity.     Right lower leg: No edema.  Skin:    General: Skin is warm and dry.     Findings: No erythema or rash.  Neurological:     General: No focal deficit present.     Mental Status: She is alert and oriented to person, place, and time.     Cranial Nerves: No cranial nerve deficit.     Coordination: Coordination normal.  Psychiatric:        Mood and Affect: Mood normal.        Behavior: Behavior normal.        Thought Content: Thought content normal.     CMP Latest Ref Rng & Units 12/20/2018  Glucose 70 - 99 mg/dL 111(H)  BUN 8 - 23 mg/dL 23  Creatinine 0.44 - 1.00 mg/dL 0.43(L)  Sodium 135 - 145 mmol/L 138  Potassium 3.5 - 5.1 mmol/L 3.4(L)  Chloride 98 - 111 mmol/L 103  CO2 22 - 32 mmol/L 27  Calcium 8.9 - 10.3 mg/dL 11.3(H)  Total Protein 6.5 - 8.1  g/dL -  Total Bilirubin 0.3 - 1.2 mg/dL -  Alkaline Phos 38 - 126 U/L -  AST 15 - 41 U/L -  ALT 0 - 44 U/L -   CBC Latest Ref Rng & Units 12/20/2018  WBC 4.0 - 10.5 K/uL 13.4(H)  Hemoglobin 12.0 - 15.0 g/dL 12.8  Hematocrit 36.0 - 46.0 % 38.9  Platelets 150 - 400 K/uL 189   RADIOGRAPHIC  STUDIES: I have personally reviewed the radiological images as listed and agreed with the findings in the report.  Dg Chest 2 View  Result Date: 12/17/2018 CLINICAL DATA:  History of cancer. Former smoker. Shortness of breath. EXAM: CHEST - 2 VIEW COMPARISON:  03/20/2016.  06/28/2012. FINDINGS: Right hilar fullness. Diffuse bilateral interstitial prominence. Multiple bilateral pulmonary nodular densities. These findings suggest malignancy with possible right hilar adenopathy, interstitial tumor spread, and metastatic disease. An inflammatory or infectious process including active granulomatous disease could also present in this fashion. Contrast-enhanced chest CT should be considered for further evaluation. No pleural effusion or pneumothorax. Heart size normal. Thoracic spine scoliosis. IMPRESSION: Right hilar fullness, diffuse bilateral interstitial prominence, and multiple bilateral pulmonary nodular densities. These findings suggest malignancy with possible right hilar adenopathy, interstitial tumor spread, and metastatic disease. An inflammatory or infectious process including active granulomatous disease could also present this fashion. Contrast-enhanced chest CT should be considered for further evaluation. Electronically Signed   By: Marcello Moores  Register   On: 12/17/2018 08:14   Ct Chest W Contrast  Result Date: 12/17/2018 CLINICAL DATA:  Follow-up exam. History of metastasis and lung nodules. EXAM: CT CHEST, ABDOMEN, AND PELVIS WITH CONTRAST TECHNIQUE: Multidetector CT imaging of the chest, abdomen and pelvis was performed following the standard protocol during bolus administration of intravenous contrast. CONTRAST:  169mL OMNIPAQUE IOHEXOL 300 MG/ML  SOLN COMPARISON:  Chest radiograph 12/16/2018; MRI lumbar spine 12/02/2018 FINDINGS: CT CHEST FINDINGS Cardiovascular: Normal heart size. Trace fluid superior pericardial recess. Thoracic aortic vascular calcifications. Mediastinum/Nodes: No axillary  lymphadenopathy. There is a 1.9 cm centrally necrotic precarinal node (image 22; series 2). There is a 1.7 cm right hilar node (image 27; series 2). There is a 1.9 cm left hilar node (image 30; series 2). There is a 1.2 cm centrally necrotic nodule along the posterior left pericardium (image 38; series 2). Mild wall thickening of the esophagus. Lungs/Pleura: Central airways are patent. There is a dominant centrally necrotic 6.2 x 5.9 cm mass within the right lower lobe (image 68; series 4). There is a 2.9 cm subpleural left lower lobe nodule (image 138; series 4). There are multiple bilateral pulmonary nodules. Reference 1.7 cm left lower lobe nodule (image 115; series 4). Reference 0.8 cm left upper lobe nodule (image 42; series 4). Reference 1.5 cm right middle lobe nodule (image 66; series 4). 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 There is a moderate right pleural effusion. Musculoskeletal: No aggressive or acute appearing osseous lesions. CT ABDOMEN PELVIS FINDINGS Hepatobiliary: There are innumerable low-attenuation lesions throughout the left and right hepatic lobes, the majority are subcentimeter in size. Gallbladder is unremarkable. No intrahepatic or extrahepatic biliary ductal dilatation. Pancreas: Unremarkable Spleen: Unremarkable Adrenals/Urinary Tract: Normal adrenal glands. Kidneys enhance symmetrically with contrast. Subcentimeter too small to characterize low-attenuation lesion inferior pole left kidney (image 77; series 2). Urinary bladder is decompressed. Stomach/Bowel: Stool throughout the colon.  No abnormal bowel wall thickening or evidence for bowel obstruction. No free fluid or free intraperitoneal air. Normal morphology of the stomach. Vascular/Lymphatic: Normal caliber abdominal aorta. Peripheral  calcified atherosclerotic plaque. 1.3 cm left periaortic lymph node (image 71; series 2). 1.1 cm aortocaval node (image 80; series 2). There is a 1.0 cm porta hepatic node (image 65; series 2). Reproductive: Prior hysterectomy. Adnexal structures are unremarkable. Other: None. Musculoskeletal: Lumbar spine degenerative changes. There is a 6.7 x 5.7 cm lytic soft tissue mass involving the right hemi sacrum (image 93; series 2) with soft tissue involving the sacral spinal canal. There is a 2.4 cm soft tissue lytic lesion within the posterior right ilium (image 92; series 2). Additional lytic lesions within the right ilium. IMPRESSION: 1. Large centrally necrotic right lower lobe mass concerning the possibility of primary pulmonary malignancy. 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 2. Multiple enlarged and necrotic mediastinal and hilar nodes most compatible with nodal metastasis. 3. Enlarged retroperitoneal nodes compatible with metastasis. 4. Innumerable low-attenuation lesions within liver compatible metastasis. 5. Large lytic mass involving the right hemi sacrum as well as smaller lytic lesions involving the right ilium consistent with osseous metastasis. Electronically Signed   By: Lovey Newcomer M.D.   On: 12/17/2018 16:47   Ct Angio Chest Pe W Or Wo Contrast  Result Date: 12/20/2018 CLINICAL DATA:  Acute onset of tachycardia and shortness of breath. Recent diagnosis lung cancer EXAM: CT ANGIOGRAPHY CHEST WITH CONTRAST TECHNIQUE: Multidetector CT imaging of the chest was performed using the standard protocol during bolus administration of intravenous contrast. Multiplanar CT image reconstructions and MIPs were obtained to evaluate the vascular anatomy. CONTRAST:  77mL OMNIPAQUE IOHEXOL 350 MG/ML SOLN COMPARISON:  12/17/2018 FINDINGS: Cardiovascular: Normal heart  size. Trace pericardial fluid or thickening-stable. Possible hypertrophic left ventricle. Multiple new branching pulmonary artery filling defects that are segmental to subsegmental and demarcated on series 5. The most proximal filling defects are seen and segmental right upper lobe branches Mediastinum/Nodes: Known low-density adenopathy in the mediastinum better depicted on parenchymal phase CT performed previously. Lungs/Pleura: Known right lower lobe mass with central low-density and pulmonary metastases on both sides. 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 Small right pleural effusion. Upper Abdomen: Extensive hepatic metastatic disease. Musculoskeletal: No acute finding. Please see Z vision log concerning efforts to reach on-call physician. Overhead paging is disallowed. Critical Value/emergent results were called by telephone at the time of interpretation on 12/20/2018 at 6:51 am to Dr. Sidney Ace, who verbally acknowledged these results. Review of the MIP images confirms the above findings. IMPRESSION: 1. Positive for multifocal segmental and subsegmental acute pulmonary emboli.No indication of right heart strain. 2. Extensive thoracic and intra-abdominal malignancy as recently staged. Electronically Signed   By: Monte Fantasia M.D.   On: 12/20/2018 06:51   Mr Lumbar Spine Wo Contrast  Result Date: 12/03/2018 CLINICAL DATA:  Sciatica right side. Right back and leg pain 4-5 months. EXAM: MRI LUMBAR SPINE WITHOUT CONTRAST TECHNIQUE: Multiplanar, multisequence MR imaging of the lumbar spine was performed. No intravenous contrast was administered. COMPARISON:  None. FINDINGS: Segmentation:  Normal Alignment:  4 mm anterolisthesis L4-5.  Mild dextroscoliosis. Vertebrae: 1 cm hyperintense lesion in the left posterior T12 vertebral body best seen on inversion recovery. Infiltrating mass lesion in the right sacrum involving the right sacrum. Tumor involves the right S1 vertebral  body and pedicle and  extends into the right S2 vertebral body. Tumor is low signal on T1 and hyperintense on T2. Extraosseous tumor with narrowing the foramen at L5-S1 and S1-2. Tumor also infiltrates the posterior iliac bone adjacent to the SI joint. Small right SI joint effusion. Conus medullaris and cauda equina: Conus extends to the L1-2 level. Conus and cauda equina appear normal. Paraspinal and other soft tissues: Negative for retroperitoneal adenopathy. Disc levels: L1-2: Mild disc and facet degeneration without stenosis L2-3: Moderate disc degeneration with disc bulging and spurring. Bilateral facet hypertrophy and mild spinal stenosis. L3-4: Disc degeneration and spondylosis. Bilateral facet hypertrophy. Moderate spinal stenosis. Mild to moderate subarticular stenosis bilaterally L4-5: 4 mm anterolisthesis. Diffuse bulging of the disc with moderate to severe facet degeneration. Moderate to severe spinal stenosis. Mild subarticular stenosis bilaterally L5-S1: Bilateral facet hypertrophy. Right foraminal encroachment due to tumor extension and spurring. 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 Tumor also infiltrates right posterior iliac bone. 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 Right L5 and S1 foraminal encroachment due to tumor extension into the soft tissues. Small lesion T12 also supportive of metastatic disease. Chondrosarcoma or lymphoma right sacrum less likely. These results were called by telephone at the time of interpretation on 12/03/2018 at 10:27 am to [facility] , who verbally acknowledged these results. Electronically Signed   By: Franchot Gallo M.D.   On: 12/03/2018 10:36   Ct Abdomen Pelvis W Contrast  Result Date: 12/17/2018 CLINICAL DATA:  Follow-up exam. History of metastasis and lung nodules. EXAM: CT CHEST, ABDOMEN, AND PELVIS WITH CONTRAST TECHNIQUE: Multidetector CT imaging of the chest,  abdomen and pelvis was performed following the standard protocol during bolus administration of intravenous contrast. CONTRAST:  141mL OMNIPAQUE IOHEXOL 300 MG/ML  SOLN COMPARISON:  Chest radiograph 12/16/2018; MRI lumbar spine 12/02/2018 FINDINGS: CT CHEST FINDINGS Cardiovascular: Normal heart size. Trace fluid superior pericardial recess. Thoracic aortic vascular calcifications. Mediastinum/Nodes: No axillary lymphadenopathy. There is a 1.9 cm centrally necrotic precarinal node (image 22; series 2). There is a 1.7 cm right hilar node (image 27; series 2). There is a 1.9 cm left hilar node (image 30; series 2). There is a 1.2 cm centrally necrotic nodule along the posterior left pericardium (image 38; series 2). Mild wall thickening of the esophagus. Lungs/Pleura: Central airways are patent. There is a dominant centrally necrotic 6.2 x 5.9 cm mass within the right lower lobe (image 68; series 4). There is a 2.9 cm subpleural left lower lobe nodule (image 138; series 4). There are multiple bilateral pulmonary nodules. Reference 1.7 cm left lower lobe nodule (image 115; series 4). Reference 0.8 cm left upper lobe nodule (image 42; series 4). Reference 1.5 cm right middle lobe nodule (image 66; series 4). 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 There is a moderate right pleural effusion. Musculoskeletal: No aggressive or acute appearing osseous lesions. CT ABDOMEN PELVIS FINDINGS Hepatobiliary: There are innumerable low-attenuation lesions throughout the left and right hepatic lobes, the majority are subcentimeter in size. Gallbladder is unremarkable. No intrahepatic or extrahepatic biliary ductal dilatation. Pancreas: Unremarkable Spleen: Unremarkable Adrenals/Urinary Tract: Normal adrenal glands. Kidneys enhance  symmetrically with contrast. Subcentimeter too small to characterize low-attenuation lesion inferior pole left kidney (image 77; series 2). Urinary bladder is decompressed. Stomach/Bowel: Stool throughout the colon.  No abnormal bowel wall thickening or evidence for bowel obstruction. No free fluid or free intraperitoneal air. Normal morphology of the stomach. Vascular/Lymphatic: Normal caliber abdominal aorta. Peripheral calcified atherosclerotic plaque. 1.3 cm left periaortic lymph node (image 71; series 2). 1.1 cm aortocaval node (image 80; series 2). There is a 1.0 cm porta hepatic node (image 65; series 2). Reproductive: Prior hysterectomy. Adnexal structures are unremarkable. Other: None. Musculoskeletal: Lumbar spine degenerative changes. There is a 6.7 x 5.7 cm lytic soft tissue mass involving the right hemi sacrum (image 93; series 2) with soft tissue involving the sacral spinal canal. There is a 2.4 cm soft tissue lytic lesion within the posterior right ilium (image 92; series 2). Additional lytic lesions within the right ilium. IMPRESSION: 1. Large centrally necrotic right lower lobe mass concerning the possibility of primary pulmonary malignancy. 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 2. Multiple enlarged and necrotic mediastinal and hilar nodes most compatible with nodal metastasis. 3. Enlarged retroperitoneal nodes compatible with metastasis. 4. Innumerable low-attenuation lesions within liver compatible metastasis. 5. Large lytic mass involving the right hemi sacrum as well as smaller lytic lesions involving the right ilium consistent with osseous metastasis. Electronically Signed   By: Lovey Newcomer M.D.   On: 12/17/2018 16:47     Assessment and plan  #Acute bilateral multifocal PE, agree with anticoagulation with heparin  CT image finding was  independent reviewed by me and discussed with patient. Check lower extremity Doppler.  #Likely metastatic lung cancer with extensive lymphadenopathy, liver and bone involvement. Aggressive bowel and process. Hold liver biopsy due to acute PE and the need of immediate anticoagulation. Recommend to allow patient be on heparin drip for 48 hours, if she is stable, will hold heparin temporarily and proceed with biopsy. I had a discussion with patient and also with daughter on the phone regarding the increased risk of thrombosis when anticoagulation is being hold for biopsy and also increased risk of bleeding/hematoma after biopsy when anticoagulation need to be resumed. Close monitor. Check MRI brain with and without contrast to complete staging.   #Acute hypoxic respiratory failure, multifactorial secondary to lung mass, lymphangitic spread, pleural effusion, PE. Continue supportive care . #Hypercalcemia, likely secondary to malignancy. Status post Zometa 4 mg x 1.  Calcium level is trending down.  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  She also had a decrease of kidney function potentially secondary to hyperuricemia. S/p  Rasburicase x 1,  Continue allopurinol.  #Lower back hip pain, exacerbated with motion.  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 On gabapentin 300 mg BID. Add oxycodone 5 mg every 8 hours as needed. Consider add steroids for bone pain after biopsy is done. Need outpatient palliative radiation  #Transaminitis, due to malignancy.   #Hypokalemia potassium is 3.4 today.. #Goal of care, we discussed briefly that her condition is most likely incurable.  Awaiting tissue diagnosis.  Patient desires chemotherapy treatments after diagnosis is finalized. I also called patient's daughter and update her with above information. I will consult palliative care for patient establish care.  discussed with Dr. Vonna Kotyk  Plan was discussed with Dr. Janice Coffin,  MD, PhD Hematology Oncology [facility] Pager- [phone] 12/20/2018

## 2018-12-20 NOTE — Progress Notes (Signed)
ANTICOAGULATION CONSULT NOTE - Initial Consult  Pharmacy Consult for heparin Indication: pulmonary embolus  Allergies  Allergen Reactions  . Furadantin  [Nitrofurantoin]   . Phenobarbital Rash    Patient Measurements: Height: 5\' 6"  (167.6 cm) Weight: 190 lb 3.2 oz (86.3 kg) IBW/kg (Calculated) : 59.3 Heparin Dosing Weight: 70.1 kg  Vital Signs: Temp: 99.3 F (37.4 C) (08/03 0532) Temp Source: Oral (08/03 0532) BP: 118/68 (08/03 0429) Pulse Rate: 125 (08/03 0532)  Labs: Recent Labs    12/18/18 0511 12/18/18 0843 12/19/18 0605 12/20/18 0435  HGB 13.4  --  12.9 12.8  HCT 39.9  --  39.3 38.9  PLT 227  --  193 189  LABPROT  --  13.3  --   --   INR  --  1.0  --   --   CREATININE 0.60  --  0.45 0.43*    Estimated Creatinine Clearance: 68.3 mL/min (A) (by C-G formula based on SCr of 0.43 mg/dL (L)).   Medical History: Past Medical History:  Diagnosis Date  . Allergy   . Cancer ([place]) 09/2014   Skin Cancer  . Hyperlipidemia   . Hypertension     Medications:  Scheduled:  . allopurinol  300 mg Oral Daily  . feeding supplement (ENSURE ENLIVE)  237 mL Oral BID BM  . fluticasone  2 spray Each Nare Daily  . folic acid  1 mg Oral Daily  . furosemide  40 mg Intravenous Daily  . gabapentin  300 mg Oral BID  . loratadine  10 mg Oral Daily  . mouth rinse  15 mL Mouth Rinse BID  . metoprolol succinate  25 mg Oral Daily  . montelukast  10 mg Oral Daily  . potassium chloride  20 mEq Oral BID  . sodium chloride flush  3 mL Intravenous Q12H    Assessment: Patient admitted for SOB s/t to lung mets that has possibly metastasized to liver. Patient has PE on CT and is going for liver biopsy this am at around 0930. Patient is to be started on heparin drip @ 0945 post-biopsy.  Goal of Therapy:  Heparin level 0.3-0.7 units/ml Monitor platelets by anticoagulation protocol: Yes   Plan:  Will not bolus since patient will be post-op and has received two days of VTE prophylaxis  lovenox. Will start heparin drip at 1200 units/hr start 0945 post-biopsy. CBC's have been WNL, previous INR WNL getting another one to confirm, and baseline aPTT pending. Will schedule anti-Xa for 1800 and will continue to monitor daily CBC's and adjust per anti-Xa levels.  Tobie Lords, PharmD, BCPS Clinical Pharmacist 12/20/2018,7:33 AM

## 2018-12-20 NOTE — Progress Notes (Signed)
[place] for heparin Indication: pulmonary embolus  Allergies  Allergen Reactions  . Furadantin  [Nitrofurantoin]   . Phenobarbital Rash    Patient Measurements: Height: 5\' 6"  (167.6 cm) Weight: 190 lb 3.2 oz (86.3 kg) IBW/kg (Calculated) : 59.3 Heparin Dosing Weight:77.8 kg  Vital Signs: Temp: 98.8 F (37.1 C) (08/03 1622) Temp Source: Oral (08/03 1622) BP: 139/73 (08/03 1622) Pulse Rate: 109 (08/03 1622)  Labs: Recent Labs    12/18/18 0511 12/18/18 0843 12/19/18 0605 12/20/18 0435 12/20/18 0809 12/20/18 1610  HGB 13.4  --  12.9 12.8  --   --   HCT 39.9  --  39.3 38.9  --   --   PLT 227  --  193 189  --   --   APTT  --   --   --   --  33  --   LABPROT  --  13.3  --   --  13.6  --   INR  --  1.0  --   --  1.1  --   HEPARINUNFRC  --   --   --   --   --  0.17*  CREATININE 0.60  --  0.45 0.43*  --   --     Estimated Creatinine Clearance: 68.3 mL/min (A) (by C-G formula based on SCr of 0.43 mg/dL (L)).   Medical History: Past Medical History:  Diagnosis Date  . Allergy   . Cancer ([place]) 09/2014   Skin Cancer  . Hyperlipidemia   . Hypertension     Medications:  Scheduled:  . allopurinol  300 mg Oral Daily  . feeding supplement (ENSURE ENLIVE)  237 mL Oral BID BM  . fluticasone  2 spray Each Nare Daily  . folic acid  1 mg Oral Daily  . gabapentin  300 mg Oral BID  . loratadine  10 mg Oral Daily  . mouth rinse  15 mL Mouth Rinse BID  . metoprolol succinate  25 mg Oral Daily  . montelukast  10 mg Oral Daily  . potassium chloride  20 mEq Oral BID  . sodium chloride flush  3 mL Intravenous Q12H    Assessment: Patient admitted for SOB s/t to lung mets that has possibly metastasized to liver. Patient has PE on CT and is going for liver biopsy this am at around 0930. Patient is to be started on heparin drip @ 0945 post-biopsy.  Will not bolus since patient will be post-op and has received two days of VTE prophylaxis  lovenox. Will start heparin drip at 1200 units/hr start 0945 post-biopsy.  Goal of Therapy:  Heparin level 0.3-0.7 units/ml Monitor platelets by anticoagulation protocol: Yes   Plan:  8/3  HL @ 1610= 0.17. Will order 2300 unit bolus x 1 and increase to 1500 units/hr  will continue to monitor daily CBC's and adjust per anti-Xa levels.  Chinita Greenland PharmD Clinical Pharmacist 12/20/2018

## 2018-12-20 NOTE — Progress Notes (Signed)
Patient has been tachycardic. Not yet due for PRN dose of metoprolol. Discussed her HR and possibility of it being caused by albuterol with resp. Therapist in the room. He agreed and stated he would change it to xopenex.

## 2018-12-20 NOTE — Progress Notes (Signed)
Pt switched to HFNC at 8 lpm due to desats. Sat now 93%.

## 2018-12-20 NOTE — Progress Notes (Signed)
[place] [facility] [place] NAME: Elizabeth [name]    MR#:  [id number]  DATE OF BIRTH:  [birth year]  SUBJECTIVE:   Patient had worsening respiratory distress last night.  She had to be changed to 8L HFNC.  She states she is feeling little bit better this morning, but is still short of breath.  CTA chest was ordered overnight and showed extensive PE.  REVIEW OF SYSTEMS:  CONSTITUTIONAL: No fever, fatigue or weakness.  EYES: No blurred or double vision.  EARS, NOSE, AND THROAT: No tinnitus or ear pain.  RESPIRATORY: No cough,  or hemoptysis. + Shortness of breath CARDIOVASCULAR: No chest pain, orthopnea, edema.  GASTROINTESTINAL: No nausea, vomiting, diarrhea or abdominal pain.  GENITOURINARY: No dysuria, hematuria.  ENDOCRINE: No polyuria, nocturia HEMATOLOGY: No anemia, easy bruising or bleeding SKIN: No rash or lesion. MUSCULOSKELETAL: No joint pain or arthritis.   NEUROLOGIC: No tingling, numbness, weakness.  PSYCHIATRY: No anxiety or depression.   DRUG ALLERGIES:   Allergies  Allergen Reactions  . Furadantin  [Nitrofurantoin]   . Phenobarbital Rash    VITALS:  Blood pressure 118/68, pulse (!) 125, temperature 99.3 F (37.4 C), temperature source Oral, resp. rate (!) 28, height 5\' 6"  (1.676 m), weight 86.3 kg, SpO2 93 %.  PHYSICAL EXAMINATION:  GENERAL:  73 y.o.-year-old patient lying in the bed with no acute distress.   EYES: Pupils equal, round, reactive to light and accommodation. No scleral icterus. Extraocular muscles intact.  HEENT: Head atraumatic, normocephalic. Oropharynx and nasopharynx clear.  NECK:  Supple, no jugular venous distention. No thyroid enlargement, no tenderness.  LUNGS: + Diminished breath sounds in the lung bases bilaterally, no wheezing, rales,rhonchi or crepitation. No use of accessory muscles of respiration.  CARDIOVASCULAR: Tachycardic, regular rhythm, S1, S2 normal. No murmurs, rubs, or gallops.   ABDOMEN: Soft, nontender, nondistended. Bowel sounds present.  EXTREMITIES: No pedal edema, cyanosis, or clubbing.  NEUROLOGIC: Cranial nerves II through XII are intact. Muscle strength 5/5 in all extremities. Sensation intact. Gait not checked.  PSYCHIATRIC: The patient is alert and oriented x 3.  SKIN: No obvious rash, lesion, or ulcer.    LABORATORY PANEL:   CBC Recent Labs  Lab 12/20/18 0435  WBC 13.4*  HGB 12.8  HCT 38.9  PLT 189   ------------------------------------------------------------------------------------------------------------------  Chemistries  Recent Labs  Lab 12/17/18 2035  12/20/18 0435  NA 137   < > 138  K 2.7*   < > 3.4*  CL 102   < > 103  CO2 24   < > 27  GLUCOSE 134*   < > 111*  BUN 26*   < > 23  CREATININE 0.83   < > 0.43*  CALCIUM 12.2*   < > 11.3*  AST 74*  --   --   ALT 95*  --   --   ALKPHOS 153*  --   --   BILITOT 0.8  --   --    < > = values in this interval not displayed.   ------------------------------------------------------------------------------------------------------------------  Cardiac Enzymes No results for input(s): TROPONINI in the last 168 hours. ------------------------------------------------------------------------------------------------------------------  RADIOLOGY:  Ct Angio Chest Pe W Or Wo Contrast  Result Date: 12/20/2018 CLINICAL DATA:  Acute onset of tachycardia and shortness of breath. Recent diagnosis lung cancer EXAM: CT ANGIOGRAPHY CHEST WITH CONTRAST TECHNIQUE: Multidetector CT imaging of the chest was performed using the standard protocol during bolus administration of intravenous contrast. Multiplanar CT image reconstructions and MIPs  were obtained to evaluate the vascular anatomy. CONTRAST:  3mL OMNIPAQUE IOHEXOL 350 MG/ML SOLN COMPARISON:  12/17/2018 FINDINGS: Cardiovascular: Normal heart size. Trace pericardial fluid or thickening-stable. Possible hypertrophic left ventricle. Multiple new branching  pulmonary artery filling defects that are segmental to subsegmental and demarcated on series 5. The most proximal filling defects are seen and segmental right upper lobe branches Mediastinum/Nodes: Known low-density adenopathy in the mediastinum better depicted on parenchymal phase CT performed previously. Lungs/Pleura: Known right lower lobe mass with central low-density and pulmonary metastases on both sides. 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 Small right pleural effusion. Upper Abdomen: Extensive hepatic metastatic disease. Musculoskeletal: No acute finding. Please see Z vision log concerning efforts to reach on-call physician. Overhead paging is disallowed. Critical Value/emergent results were called by telephone at the time of interpretation on 12/20/2018 at 6:51 am to Dr. Sidney Ace, who verbally acknowledged these results. Review of the MIP images confirms the above findings. IMPRESSION: 1. Positive for multifocal segmental and subsegmental acute pulmonary emboli.No indication of right heart strain. 2. Extensive thoracic and intra-abdominal malignancy as recently staged. Electronically Signed   By: Monte Fantasia M.D.   On: 12/20/2018 06:51    EKG:   Orders placed or performed during the hospital encounter of 12/17/18  . EKG 12-Lead  . 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 -s/p IV Zometa x 1  Hypertension- BP well controlled -Continue metoprolol -Holding home losartan with recent contrast load  All the records are reviewed and case discussed with Care Management/Social Workerr. Management plans discussed with the patient,she is  in agreement.  Patient said that she would update to the family members and requesting not to talk to her family members at this point of time  CODE STATUS: Full code  TOTAL TIME TAKING CARE OF THIS PATIENT: 45  minutes.   POSSIBLE D/C IN 2-3 DAYS, DEPENDING ON CLINICAL CONDITION.  Note: This dictation was prepared with Dragon dictation along with smaller phrase technology. Any transcriptional errors that result from this process are unintentional.   Evette Doffing M.D on 12/20/2018 at 1:11 PM  Between 7am to 6pm - Pager - [phone]  After 6pm go to [email or web address] - password [email or web address] [facility] [facility]  Office  [phone]  CC: Primary care physician; Mar Daring, PA-C

## 2018-12-20 NOTE — Progress Notes (Addendum)
Received call from Dr. Sidney Ace at [phone] stating patient had mulitple PE's and he would like to start the patient on lovenox. Clarified the order since patient is scheduled for liver biopsy. Dr. Sidney Ace then said to start a heparin drip followed by coumadin. He then asked what time the liver biopsy would be. I told him I was unsure but I could call after 7. He stated to see how soon they could do it. He stated once I found out to call daytime hospitalist. Spoke with Curly Sh[place] RN in IR who states that the MD comes in at 7:30 but if the case was approved they could do it around 9:30 this morning but patient could not be on heparin drip. I notified her of the situation with the PE's, she stated she would speak with Dr. Pascal Lux when he arrived. Page sent to dayshift MD Dr. Brett Albino to make aware of situation. Waiting on response. Made [employer] RN Beth aware of entire situation.  Received call back from Dr. Brett Albino. We will start heparin drip following liver biopsy. She will also come and speak with patient to make aware of CTA results.  Update: During bedside report Dr. Brett Albino stated that liver biopsy would be put on hold so that heparin drip could be started immediately. Beth RN will notify pharmacy. Dr. Brett Albino will notify radiology that biopsy has been cancelled.

## 2018-12-21 ENCOUNTER — Inpatient Hospital Stay: Payer: Medicare Other

## 2018-12-21 ENCOUNTER — Ambulatory Visit: Payer: Medicare Other

## 2018-12-21 DIAGNOSIS — Z86711 Personal history of pulmonary embolism: Secondary | ICD-10-CM

## 2018-12-21 DIAGNOSIS — Z515 Encounter for palliative care: Secondary | ICD-10-CM

## 2018-12-21 LAB — CBC
HCT: 37.4 % (ref 36.0–46.0)
Hemoglobin: 12.4 g/dL (ref 12.0–15.0)
MCH: 30.9 pg (ref 26.0–34.0)
MCHC: 33.2 g/dL (ref 30.0–36.0)
MCV: 93.3 fL (ref 80.0–100.0)
Platelets: 191 10*3/uL (ref 150–400)
RBC: 4.01 MIL/uL (ref 3.87–5.11)
RDW: 13.1 % (ref 11.5–15.5)
WBC: 18.5 10*3/uL — ABNORMAL HIGH (ref 4.0–10.5)
nRBC: 0 % (ref 0.0–0.2)

## 2018-12-21 LAB — BASIC METABOLIC PANEL
Anion gap: 7 (ref 5–15)
BUN: 19 mg/dL (ref 8–23)
CO2: 26 mmol/L (ref 22–32)
Calcium: 10.5 mg/dL — ABNORMAL HIGH (ref 8.9–10.3)
Chloride: 103 mmol/L (ref 98–111)
Creatinine, Ser: 0.48 mg/dL (ref 0.44–1.00)
GFR calc Af Amer: >60 mL/min (ref >60)
GFR calc non Af Amer: >60 mL/min (ref >60)
Glucose, Bld: 110 mg/dL — ABNORMAL HIGH (ref 70–99)
Potassium: 3.8 mmol/L (ref 3.5–5.1)
Sodium: 136 mmol/L (ref 135–145)

## 2018-12-21 LAB — PROCALCITONIN: Procalcitonin: 0.51 ng/mL

## 2018-12-21 LAB — HEPARIN LEVEL (UNFRACTIONATED)
Heparin Unfractionated: 0.31 IU/mL (ref 0.30–0.70)
Heparin Unfractionated: 0.45 IU/mL (ref 0.30–0.70)
Heparin Unfractionated: 0.48 IU/mL (ref 0.30–0.70)

## 2018-12-21 MED ORDER — FUROSEMIDE 10 MG/ML IJ SOLN
20.0000 mg | Freq: Once | INTRAMUSCULAR | Status: AC
  Administered 2018-12-21: 20 mg via INTRAVENOUS
  Filled 2018-12-21: qty 2

## 2018-12-21 MED ORDER — GADOBUTROL 1 MMOL/ML IV SOLN
8.0000 mL | Freq: Once | INTRAVENOUS | Status: AC | PRN
  Administered 2018-12-21: 11:00:00 8 mL via INTRAVENOUS

## 2018-12-21 NOTE — Progress Notes (Addendum)
Hematology/Oncology Follow Up Note [facility]  [phone] [phone]  Patient Care Team: Rubye Beach as PCP - General (Family Medicine) Dasher, Rayvon Char, MD as Consulting Physician (Dermatology) Yolonda Kida, MD as Consulting Physician (Cardiology) Dingeldein, Remo Lipps, MD as Consulting Physician (Ophthalmology) Kipp Laurence, MD as Referring Physician (Audiology) Grant Fontana, [place] as Referring Physician (Chiropractic Medicine) Watt Climes, PA as Physician Assistant (Physician Assistant)   Name of the patient: Elizabeth [name]  [id number]  [birth year]   INTERVAL HISTORY Patient is lying the bed, breathing via nasal cannula oxygen.  Having a nap. She reports that she was not able to sleep well last night. She is on heparin drip.  No bleeding events. Reports that breathing has not significantly improved. She feels that oxycodone and gabapentin have not improved her pain but morphine has.  Prefers to just take morphine for the time being.   Review of Systems  Constitutional: Positive for appetite change, fatigue and unexpected weight change. Negative for chills and fever.  HENT:   Negative for hearing loss and voice change.   Eyes: Negative for eye problems.  Respiratory: Positive for shortness of breath. Negative for chest tightness and cough.   Cardiovascular: Negative for chest pain.  Gastrointestinal: Negative for abdominal distention, abdominal pain and blood in stool.  Endocrine: Negative for hot flashes.  Genitourinary: Negative for difficulty urinating and frequency.   Musculoskeletal: Positive for back pain. Negative for arthralgias.  Skin: Negative for itching and rash.  Neurological: Negative for extremity weakness.  Hematological: Negative for adenopathy.  Psychiatric/Behavioral: Negative for confusion.      Allergies  Allergen Reactions   Furadantin  [Nitrofurantoin]    Phenobarbital Rash      Past Medical History:  Diagnosis Date   Allergy    Cancer ([place]) 09/2014   Skin Cancer   Hyperlipidemia    Hypertension      Past Surgical History:  Procedure Laterality Date   ABDOMINAL HYSTERECTOMY  1989   due to menorrhagia   COLONOSCOPY     [place]   SKIN CANCER EXCISION  10/2014   on chest; Dr. Evorn Gong   TRIGGER FINGER RELEASE Right 2000   WRIST ARTHROPLASTY Left 1991    Social History   Socioeconomic History   Marital status: Married    Spouse name: Eulas Post   Number of children: 2   Years of education: Not on file   Highest education level: Bachelor's degree (e.g., BA, AB, BS)  Occupational History   Occupation: retired  Scientist, product/process development strain: Not hard at [employer]    Worry: Never true    Inability: Never true   Transportation needs    Medical: No    Non-medical: No  Tobacco Use   Smoking status: Former Smoker    Packs/day: 0.50    Years: 35.00    Pack years: 17.50    Quit date: 05/20/2003    Years since quitting: 15.6   Smokeless tobacco: Never Used  Substance and Sexual Activity   Alcohol use: Yes    Alcohol/week: 14.0 standard drinks    Types: 14 Glasses of wine per week    Comment: occasional; 12/16/18 - states no alcohol since 12/02/18    Drug use: No   Sexual activity: Not on file  Lifestyle   Physical activity    Days per week: 0 days    Minutes per session:  0 min   Stress: Not at all  Relationships   Social connections    Talks on phone: Patient refused    Gets together: Patient refused    Attends religious service: Patient refused    Active member of club or organization: Patient refused    Attends meetings of clubs or organizations: Patient refused    Relationship status: Patient refused   Intimate partner violence    Fear of current or ex partner: Patient refused    Emotionally abused: Patient refused    Physically abused: Patient refused     Forced sexual activity: Patient refused  Other Topics Concern   Not on file  Social History Narrative   Not on file    Family History  Problem Relation Age of Onset   Arthritis Mother    Cerebrovascular Disease Mother    Heart disease Mother    Vascular Disease Mother    Hypertension Father    COPD Father    Throat cancer Father    Melanoma Father    Prostate cancer Father    Skin cancer Brother    Skin cancer Brother    Breast cancer Paternal Aunt 74     Current Facility-Administered Medications:    0.9 %  sodium chloride infusion, 250 mL, Intravenous, PRN, Seals, Theo Dills, NP, Stopped at 12/18/18 2505   acetaminophen (TYLENOL) tablet 650 mg, 650 mg, Oral, Q6H PRN, 650 mg at 12/20/18 0358 **OR** acetaminophen (TYLENOL) suppository 650 mg, 650 mg, Rectal, Q6H PRN, Seals, Angela H, NP   allopurinol (ZYLOPRIM) tablet 300 mg, 300 mg, Oral, Daily, Earlie Server, MD, 300 mg at 12/21/18 1021   feeding supplement (ENSURE ENLIVE) (ENSURE ENLIVE) liquid 237 mL, 237 mL, Oral, BID BM, Gouru, Aruna, MD, 237 mL at 12/20/18 1354   fluticasone (FLONASE) 50 MCG/ACT nasal spray 2 spray, 2 spray, Each Nare, Daily, Seals, Angela H, NP, 2 spray at 39/76/73 4193   folic acid (FOLVITE) tablet 1 mg, 1 mg, Oral, Daily, Seals, Angela H, NP, 1 mg at 12/21/18 1021   gabapentin (NEURONTIN) capsule 300 mg, 300 mg, Oral, BID, Earlie Server, MD, 300 mg at 12/21/18 1021   guaiFENesin-dextromethorphan (ROBITUSSIN DM) 100-10 MG/5ML syrup 5 mL, 5 mL, Oral, Q4H PRN, Gouru, Aruna, MD, 5 mL at 12/18/18 2321   heparin ADULT infusion 100 units/mL (25000 units/269m sodium chloride 0.45%), 1,550 Units/hr, Intravenous, Continuous, Mayo, KPete Pelt MD, Last Rate: 15.5 mL/hr at 12/21/18 0529, 1,550 Units/hr at 12/21/18 0529   levalbuterol (XOPENEX) nebulizer solution 0.63 mg, 0.63 mg, Nebulization, Q6H PRN, Gouru, Aruna, MD, 0.63 mg at 12/21/18 0819   loratadine (CLARITIN) tablet 10 mg, 10 mg, Oral, Daily,  Seals, Angela H, NP, 10 mg at 12/21/18 1021   MEDLINE mouth rinse, 15 mL, Mouth Rinse, BID, Seals, Angela H, NP, 15 mL at 12/21/18 1000   metoprolol succinate (TOPROL-XL) 24 hr tablet 25 mg, 25 mg, Oral, Daily, Seals, Angela H, NP, 25 mg at 12/21/18 1022   metoprolol tartrate (LOPRESSOR) injection 5 mg, 5 mg, Intravenous, Q6H PRN, Gouru, Aruna, MD, 5 mg at 12/21/18 0036   montelukast (SINGULAIR) tablet 10 mg, 10 mg, Oral, Daily, Seals, Angela H, NP, 10 mg at 12/21/18 1021   morphine 2 MG/ML injection 1 mg, 1 mg, Intravenous, Q4H PRN, Mansy, Jan A, MD, 1 mg at 12/20/18 2222   morphine 2 MG/ML injection 2 mg, 2 mg, Intravenous, Q4H PRN, Mansy, Jan A, MD   ondansetron (ZOFRAN) tablet 4 mg, 4 mg, Oral, Q6H  PRN **OR** ondansetron (ZOFRAN) injection 4 mg, 4 mg, Intravenous, Q6H PRN, Seals, Theo Dills, NP   oxyCODONE (Oxy IR/ROXICODONE) immediate release tablet 5 mg, 5 mg, Oral, Q8H PRN, Earlie Server, MD, 5 mg at 12/21/18 1021   polyethylene glycol (MIRALAX / GLYCOLAX) packet 17 g, 17 g, Oral, Daily PRN, Seals, Theo Dills, NP   potassium chloride (KLOR-CON) packet 20 mEq, 20 mEq, Oral, BID, Seals, Angela H, NP, 20 mEq at 12/21/18 1022   sodium chloride flush (NS) 0.9 % injection 3 mL, 3 mL, Intravenous, Q12H, Seals, Angela H, NP, 3 mL at 12/21/18 1000   sodium chloride flush (NS) 0.9 % injection 3 mL, 3 mL, Intravenous, PRN, Seals, Angela H, NP   tiZANidine (ZANAFLEX) tablet 4 mg, 4 mg, Oral, TID PRN, Fritzi Mandes, MD, 4 mg at 12/21/18 1022  Physical exam:  Vitals:   12/20/18 2221 12/21/18 0234 12/21/18 0451 12/21/18 1027  BP: 130/80 (!) 108/92 133/66 (!) 114/56  Pulse:  (!) 124 (!) 117 (!) 113  Resp:  20 (!) 22   Temp:  98.3 F (36.8 C) 99.1 F (37.3 C) 98.3 F (36.8 C)  TempSrc:  Oral Oral Oral  SpO2:  90% 93%   Weight:      Height:       Physical Exam Constitutional:      General: She is not in acute distress.    Appearance: She is not diaphoretic.  HENT:     Head: Normocephalic  and atraumatic.  Eyes:     General: No scleral icterus.    Extraocular Movements: Extraocular movements intact.     Pupils: Pupils are equal, round, and reactive to light.  Neck:     Musculoskeletal: Normal range of motion and neck supple.  Cardiovascular:     Rate and Rhythm: Normal rate and regular rhythm.     Heart sounds: Normal heart sounds.  Pulmonary:     Effort: Pulmonary effort is normal. No respiratory distress.     Breath sounds: Decreased breath sounds present. No wheezing or rhonchi.     Comments: Breathing via nasal cannula oxygen.  Right basilar crackles. Chest:     Chest wall: No mass or tenderness.  Abdominal:     General: Bowel sounds are normal. There is no distension.     Palpations: Abdomen is soft. There is no splenomegaly or mass.     Tenderness: There is no abdominal tenderness.  Musculoskeletal: Normal range of motion.        General: No deformity.     Right lower leg: No edema.  Skin:    General: Skin is warm and dry.     Findings: No erythema or rash.  Neurological:     General: No focal deficit present.     Mental Status: She is alert and oriented to person, place, and time.     Cranial Nerves: No cranial nerve deficit.     Coordination: Coordination normal.  Psychiatric:        Mood and Affect: Mood normal.        Behavior: Behavior normal.        Thought Content: Thought content normal.     CMP Latest Ref Rng & Units 12/21/2018  Glucose 70 - 99 mg/dL 110(H)  BUN 8 - 23 mg/dL 19  Creatinine 0.44 - 1.00 mg/dL 0.48  Sodium 135 - 145 mmol/L 136  Potassium 3.5 - 5.1 mmol/L 3.8  Chloride 98 - 111 mmol/L 103  CO2 22 - 32 mmol/L  26  Calcium 8.9 - 10.3 mg/dL 10.5(H)  Total Protein 6.5 - 8.1 g/dL -  Total Bilirubin 0.3 - 1.2 mg/dL -  Alkaline Phos 38 - 126 U/L -  AST 15 - 41 U/L -  ALT 0 - 44 U/L -   CBC Latest Ref Rng & Units 12/21/2018  WBC 4.0 - 10.5 K/uL 18.5(H)  Hemoglobin 12.0 - 15.0 g/dL 12.4  Hematocrit 36.0 - 46.0 % 37.4  Platelets 150  - 400 K/uL 191   RADIOGRAPHIC STUDIES: I have personally reviewed the radiological images as listed and agreed with the findings in the report.  Dg Chest 2 View  Result Date: 12/17/2018 CLINICAL DATA:  History of cancer. Former smoker. Shortness of breath. EXAM: CHEST - 2 VIEW COMPARISON:  03/20/2016.  06/28/2012. FINDINGS: Right hilar fullness. Diffuse bilateral interstitial prominence. Multiple bilateral pulmonary nodular densities. These findings suggest malignancy with possible right hilar adenopathy, interstitial tumor spread, and metastatic disease. An inflammatory or infectious process including active granulomatous disease could also present in this fashion. Contrast-enhanced chest CT should be considered for further evaluation. No pleural effusion or pneumothorax. Heart size normal. Thoracic spine scoliosis. IMPRESSION: Right hilar fullness, diffuse bilateral interstitial prominence, and multiple bilateral pulmonary nodular densities. These findings suggest malignancy with possible right hilar adenopathy, interstitial tumor spread, and metastatic disease. An inflammatory or infectious process including active granulomatous disease could also present this fashion. Contrast-enhanced chest CT should be considered for further evaluation. Electronically Signed   By: Marcello Moores  Register   On: 12/17/2018 08:14   Ct Chest W Contrast  Result Date: 12/17/2018 CLINICAL DATA:  Follow-up exam. History of metastasis and lung nodules. EXAM: CT CHEST, ABDOMEN, AND PELVIS WITH CONTRAST TECHNIQUE: Multidetector CT imaging of the chest, abdomen and pelvis was performed following the standard protocol during bolus administration of intravenous contrast. CONTRAST:  13m OMNIPAQUE IOHEXOL 300 MG/ML  SOLN COMPARISON:  Chest radiograph 12/16/2018; MRI lumbar spine 12/02/2018 FINDINGS: CT CHEST FINDINGS Cardiovascular: Normal heart size. Trace fluid superior pericardial recess. Thoracic aortic vascular calcifications.  Mediastinum/Nodes: No axillary lymphadenopathy. There is a 1.9 cm centrally necrotic precarinal node (image 22; series 2). There is a 1.7 cm right hilar node (image 27; series 2). There is a 1.9 cm left hilar node (image 30; series 2). There is a 1.2 cm centrally necrotic nodule along the posterior left pericardium (image 38; series 2). Mild wall thickening of the esophagus. Lungs/Pleura: Central airways are patent. There is a dominant centrally necrotic 6.2 x 5.9 cm mass within the right lower lobe (image 68; series 4). There is a 2.9 cm subpleural left lower lobe nodule (image 138; series 4). There are multiple bilateral pulmonary nodules. Reference 1.7 cm left lower lobe nodule (image 115; series 4). Reference 0.8 cm left upper lobe nodule (image 42; series 4). Reference 1.5 cm right middle lobe nodule (image 66; series 4). 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 There is a moderate right pleural effusion. Musculoskeletal: No aggressive or acute appearing osseous lesions. CT ABDOMEN PELVIS FINDINGS Hepatobiliary: There are innumerable low-attenuation lesions throughout the left and right hepatic lobes, the majority are subcentimeter in size. Gallbladder is unremarkable. No intrahepatic or extrahepatic biliary ductal dilatation. Pancreas: Unremarkable Spleen: Unremarkable Adrenals/Urinary Tract: Normal adrenal glands. Kidneys enhance symmetrically with contrast. Subcentimeter too small to characterize low-attenuation lesion inferior pole left  kidney (image 77; series 2). Urinary bladder is decompressed. Stomach/Bowel: Stool throughout the colon. No abnormal bowel wall thickening or evidence for bowel obstruction. No free fluid or free intraperitoneal air. Normal morphology of the stomach. Vascular/Lymphatic: Normal  caliber abdominal aorta. Peripheral calcified atherosclerotic plaque. 1.3 cm left periaortic lymph node (image 71; series 2). 1.1 cm aortocaval node (image 80; series 2). There is a 1.0 cm porta hepatic node (image 65; series 2). Reproductive: Prior hysterectomy. Adnexal structures are unremarkable. Other: None. Musculoskeletal: Lumbar spine degenerative changes. There is a 6.7 x 5.7 cm lytic soft tissue mass involving the right hemi sacrum (image 93; series 2) with soft tissue involving the sacral spinal canal. There is a 2.4 cm soft tissue lytic lesion within the posterior right ilium (image 92; series 2). Additional lytic lesions within the right ilium. IMPRESSION: 1. Large centrally necrotic right lower lobe mass concerning the possibility of primary pulmonary malignancy. 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 2. Multiple enlarged and necrotic mediastinal and hilar nodes most compatible with nodal metastasis. 3. Enlarged retroperitoneal nodes compatible with metastasis. 4. Innumerable low-attenuation lesions within liver compatible metastasis. 5. Large lytic mass involving the right hemi sacrum as well as smaller lytic lesions involving the right ilium consistent with osseous metastasis. Electronically Signed   By: Lovey Newcomer M.D.   On: 12/17/2018 16:47   Ct Angio Chest Pe W Or Wo Contrast  Result Date: 12/20/2018 CLINICAL DATA:  Acute onset of tachycardia and shortness of breath. Recent diagnosis lung cancer EXAM: CT ANGIOGRAPHY CHEST WITH CONTRAST TECHNIQUE: Multidetector CT imaging of the chest was performed using the standard protocol during bolus administration of intravenous contrast. Multiplanar CT image reconstructions and MIPs were obtained to evaluate the vascular anatomy. CONTRAST:  61m OMNIPAQUE IOHEXOL 350 MG/ML SOLN COMPARISON:  12/17/2018  FINDINGS: Cardiovascular: Normal heart size. Trace pericardial fluid or thickening-stable. Possible hypertrophic left ventricle. Multiple new branching pulmonary artery filling defects that are segmental to subsegmental and demarcated on series 5. The most proximal filling defects are seen and segmental right upper lobe branches Mediastinum/Nodes: Known low-density adenopathy in the mediastinum better depicted on parenchymal phase CT performed previously. Lungs/Pleura: Known right lower lobe mass with central low-density and pulmonary metastases on both sides. 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 Small right pleural effusion. Upper Abdomen: Extensive hepatic metastatic disease. Musculoskeletal: No acute finding. Please see Z vision log concerning efforts to reach on-call physician. Overhead paging is disallowed. Critical Value/emergent results were called by telephone at the time of interpretation on 12/20/2018 at 6:51 am to Dr. MSidney Ace who verbally acknowledged these results. Review of the MIP images confirms the above findings. IMPRESSION: 1. Positive for multifocal segmental and subsegmental acute pulmonary emboli.No indication of right heart strain. 2. Extensive thoracic and intra-abdominal malignancy as recently staged. Electronically Signed   By: JMonte FantasiaM.D.   On: 12/20/2018 06:51   Mr BJeri CosWXHContrast  Result Date: 12/21/2018 CLINICAL DATA:  Non-small-cell lung cancer staging EXAM: MRI HEAD WITHOUT AND WITH CONTRAST TECHNIQUE: Multiplanar, multiecho pulse sequences of the brain and surrounding structures were obtained without and with intravenous contrast. CONTRAST:  8 mL Gadovist IV COMPARISON:  None. FINDINGS: Brain: Image quality degraded by motion especially the postcontrast images. Multiple enhancing lesions the brain compatible with metastatic disease. Axial images significantly degraded by motion. Coronal and sagittal images have less motion. 8 mm  enhancing lesion right occipital lobe 6 mm enhancing lesion right posterior temporal lobe above the tentorium 9 mm lesion right parietal cortex 3 mm lesion right parietal lobe 5 mm left inferior temporal lobe 5 mm right frontal lesion 5 mm left frontal lesion. No significant brain edema or midline shift. No acute infarct. Negative for hemorrhage Vascular: Normal arterial flow voids Skull and upper cervical spine: No focal skeletal lesion. 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 All less than 1 cm. No significant edema. Image quality degraded by motion which could limit detection of small lesions. Electronically Signed   By: Franchot Gallo M.D.   On: 12/21/2018 11:59   Mr Lumbar Spine Wo Contrast  Result Date: 12/03/2018 CLINICAL DATA:  Sciatica right side. Right back and leg pain 4-5 months. EXAM: MRI LUMBAR SPINE WITHOUT CONTRAST TECHNIQUE: Multiplanar, multisequence MR imaging of the lumbar spine was performed. No intravenous contrast was administered. COMPARISON:  None. FINDINGS: Segmentation:  Normal Alignment:  4 mm anterolisthesis L4-5.  Mild dextroscoliosis. Vertebrae: 1 cm hyperintense lesion in the left posterior T12 vertebral body best seen on inversion recovery. Infiltrating mass lesion in the right sacrum involving the right sacrum. Tumor involves the right S1 vertebral body and pedicle and extends into the right S2 vertebral body. Tumor is low signal on T1 and hyperintense on T2. Extraosseous tumor with narrowing the foramen at L5-S1 and S1-2. Tumor also infiltrates the posterior iliac bone adjacent to the SI joint. Small right SI joint effusion. Conus medullaris and cauda equina: Conus extends to the L1-2 level. Conus and cauda equina appear normal. Paraspinal and other soft tissues: Negative for retroperitoneal adenopathy. Disc levels: L1-2: Mild disc and facet degeneration without stenosis L2-3: Moderate disc degeneration with disc bulging and  spurring. Bilateral facet hypertrophy and mild spinal stenosis. L3-4: Disc degeneration and spondylosis. Bilateral facet hypertrophy. Moderate spinal stenosis. Mild to moderate subarticular stenosis bilaterally L4-5: 4 mm anterolisthesis. Diffuse bulging of the disc with moderate to severe facet degeneration. Moderate to severe spinal stenosis. Mild subarticular stenosis bilaterally L5-S1: Bilateral facet hypertrophy. Right foraminal encroachment due to tumor extension and spurring. 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 Tumor also infiltrates right posterior iliac bone. 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 Right L5 and S1 foraminal encroachment due to tumor extension into the soft tissues. Small lesion T12 also supportive of metastatic disease. Chondrosarcoma or lymphoma right sacrum less likely. These results were called by telephone at the time of interpretation on 12/03/2018 at 10:27 am to [facility] , who verbally acknowledged these results. Electronically Signed   By: Franchot Gallo M.D.   On: 12/03/2018 10:36   Ct Abdomen Pelvis W Contrast  Result Date: 12/17/2018 CLINICAL DATA:  Follow-up exam. History of metastasis and lung nodules. EXAM: CT CHEST, ABDOMEN, AND PELVIS WITH CONTRAST TECHNIQUE: Multidetector CT imaging of the chest, abdomen and pelvis was performed following the standard protocol during bolus administration of intravenous contrast. CONTRAST:  19m OMNIPAQUE IOHEXOL 300 MG/ML  SOLN COMPARISON:  Chest radiograph 12/16/2018; MRI lumbar spine 12/02/2018 FINDINGS: CT CHEST FINDINGS Cardiovascular: Normal heart size. Trace fluid superior pericardial recess. Thoracic aortic vascular calcifications. Mediastinum/Nodes: No axillary lymphadenopathy. There is a 1.9 cm centrally necrotic precarinal node (image 22; series 2). There is a 1.7 cm right hilar node (image 27; series 2). There is a 1.9 cm left  hilar node (image 30; series 2). There is a 1.2 cm  centrally necrotic nodule along the posterior left pericardium (image 38; series 2). Mild wall thickening of the esophagus. Lungs/Pleura: Central airways are patent. There is a dominant centrally necrotic 6.2 x 5.9 cm mass within the right lower lobe (image 68; series 4). There is a 2.9 cm subpleural left lower lobe nodule (image 138; series 4). There are multiple bilateral pulmonary nodules. Reference 1.7 cm left lower lobe nodule (image 115; series 4). Reference 0.8 cm left upper lobe nodule (image 42; series 4). Reference 1.5 cm right middle lobe nodule (image 66; series 4). 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 There is a moderate right pleural effusion. Musculoskeletal: No aggressive or acute appearing osseous lesions. CT ABDOMEN PELVIS FINDINGS Hepatobiliary: There are innumerable low-attenuation lesions throughout the left and right hepatic lobes, the majority are subcentimeter in size. Gallbladder is unremarkable. No intrahepatic or extrahepatic biliary ductal dilatation. Pancreas: Unremarkable Spleen: Unremarkable Adrenals/Urinary Tract: Normal adrenal glands. Kidneys enhance symmetrically with contrast. Subcentimeter too small to characterize low-attenuation lesion inferior pole left kidney (image 77; series 2). Urinary bladder is decompressed. Stomach/Bowel: Stool throughout the colon. No abnormal bowel wall thickening or evidence for bowel obstruction. No free fluid or free intraperitoneal air. Normal morphology of the stomach. Vascular/Lymphatic: Normal caliber abdominal aorta. Peripheral calcified atherosclerotic plaque. 1.3 cm left periaortic lymph node (image 71; series 2). 1.1 cm aortocaval node (image 80; series 2). There is a 1.0 cm porta hepatic node  (image 65; series 2). Reproductive: Prior hysterectomy. Adnexal structures are unremarkable. Other: None. Musculoskeletal: Lumbar spine degenerative changes. There is a 6.7 x 5.7 cm lytic soft tissue mass involving the right hemi sacrum (image 93; series 2) with soft tissue involving the sacral spinal canal. There is a 2.4 cm soft tissue lytic lesion within the posterior right ilium (image 92; series 2). Additional lytic lesions within the right ilium. IMPRESSION: 1. Large centrally necrotic right lower lobe mass concerning the possibility of primary pulmonary malignancy. 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 2. Multiple enlarged and necrotic mediastinal and hilar nodes most compatible with nodal metastasis. 3. Enlarged retroperitoneal nodes compatible with metastasis. 4. Innumerable low-attenuation lesions within liver compatible metastasis. 5. Large lytic mass involving the right hemi sacrum as well as smaller lytic lesions involving the right ilium consistent with osseous metastasis. Electronically Signed   By: Lovey Newcomer M.D.   On: 12/17/2018 16:47   US Venous Img Lower Bilateral  Result Date: 12/20/2018 CLINICAL DATA:  Metastatic cancer, pulmonary emboli, anticoagulated EXAM: BILATERAL LOWER EXTREMITY VENOUS DOPPLER ULTRASOUND TECHNIQUE: Gray-scale sonography with graded compression, as well as color Doppler and duplex ultrasound were performed to evaluate the lower extremity deep venous systems from the level of the common femoral vein and including the common femoral, femoral, profunda femoral, popliteal and calf veins including the posterior tibial, peroneal and gastrocnemius veins when visible. The superficial great saphenous vein was also interrogated. Spectral Doppler was utilized to evaluate flow at rest and with distal augmentation maneuvers in the  common femoral, femoral and popliteal veins. COMPARISON:  None. FINDINGS: RIGHT LOWER EXTREMITY Common Femoral Vein: No evidence of thrombus. Normal compressibility, respiratory phasicity and response to augmentation. Saphenofemoral Junction: No evidence of thrombus. Normal compressibility and flow on color Doppler  imaging. Profunda Femoral Vein: No evidence of thrombus. Normal compressibility and flow on color Doppler imaging. Femoral Vein: No evidence of thrombus. Normal compressibility, respiratory phasicity and response to augmentation. Popliteal Vein: No evidence of thrombus. Normal compressibility, respiratory phasicity and response to augmentation. Calf Veins: No evidence of thrombus. Normal compressibility and flow on color Doppler imaging. Superficial Great Saphenous Vein: No evidence of thrombus. Normal compressibility. Venous Reflux:  Not assessed Other Findings:  None. LEFT LOWER EXTREMITY Common Femoral Vein: No evidence of thrombus. Normal compressibility, respiratory phasicity and response to augmentation. Saphenofemoral Junction: No evidence of thrombus. Normal compressibility and flow on color Doppler imaging. Profunda Femoral Vein: No evidence of thrombus. Normal compressibility and flow on color Doppler imaging. Femoral Vein: No evidence of thrombus. Normal compressibility, respiratory phasicity and response to augmentation. Popliteal Vein: No evidence of thrombus. Normal compressibility, respiratory phasicity and response to augmentation. Calf Veins: Left posterior tibial calf veins demonstrate intraluminal hypoechoic thrombus over a short segment. Vessel is noncompressible. Very low thrombus burden. Other calf veins appear patent. Superficial Great Saphenous Vein: No evidence of thrombus. Normal compressibility. Left small saphenous vein also demonstrates hypoechoic thrombus. Vessel is noncompressible. Very low thrombus burden. Findings compatible with left small saphenous vein superficial  thrombosis. Venous Reflux:  Not assessed Other Findings:  None. IMPRESSION: Negative for right lower extremity DVT. Positive for left calf posterior tibial DVT. Very low thrombus burden. No propagation into the popliteal or femoral veins. Associated left small saphenous vein superficial thrombosis/thrombophlebitis. Electronically Signed   By: Jerilynn Mages.  Shick M.D.   On: 12/20/2018 15:49     Assessment and plan  #Acute bilateral multifocal PE, agree with anticoagulation with heparin  CT image finding was independent reviewed by me and discussed with patient. 12/20/2018 lower extremity Doppler showed.  Left calf posterior tibial DVT, low thrombus burden. Continue heparin drip. Her breathing status has not significantly improved.  Recommend to continue uninterrupted heparin drip for another 24 hours and reevaluate before decision of proceeding with biopsy with interruption of anticoagulation.  #Likely metastatic lung cancer with extensive lymphadenopathy, liver and bone involvement. Aggressive malignant process. Hold liver biopsy due to acute PE and the need of immediate anticoagulation. 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 No edema.  She will need palliative radiation outpatient.  Currently asymptomatic. Liver biopsy in the setting of anticoagulation is associated with high bleeding risk. Discussed with pulmonology regarding bronchoscopy, high risk bleeding as well. I discussed with IR.  Patient has right ilium lesion and per IR, possible soft tissue biopsy can be obtained, hold heparin for 1 hour. Discussed the change of the plan with patient.  Tomorrow, if she remains stable, can hold heparin for an hour prior to proceeding with CT-guided bone marrow biopsy.  Orders are placed.   If she still in mild respiratory distress, will delay biopsy and allow another 24 hours of anticoagulation. Discussed with Dr. Brett Albino.   #Acute  hypoxic respiratory failure, multifactorial secondary to lung mass, lymphangitic spread, pleural effusion, PE.  Continue supportive care. Crackle at base. Will give Lasix 64m x 1 today.   #Hypercalcemia, secondary to malignancy.  Status post Zometa 4 mg x 1.  Calcium level has trended down. #Uric acid level as trended down after respite case.  Continue allopurinol.  #Lower back/hip pain.  Exacerbated with motion.  Continue morphine as needed.  #Transaminitis, due to malignancy.    #Goal of care, we discussed briefly that her condition is  most likely incurable.  Awaiting tissue diagnosis.  Patient desires chemotherapy treatments after diagnosis is finalized. CODE STATUS was discussed.  Patient says "I will do what I need to do".  I called patient's daughter Chrys Racer and update her daily.  Consulted palliative care to establish care, CODE STATUS discussion.  Plan was discussed with Dr. Janice Coffin, MD, PhD Hematology Oncology [facility] Pager- [phone] 12/21/2018

## 2018-12-21 NOTE — Progress Notes (Signed)
[facility] for heparin Indication: pulmonary embolus  Allergies  Allergen Reactions  . Furadantin  [Nitrofurantoin]   . Phenobarbital Rash    Patient Measurements: Height: 5\' 6"  (167.6 cm) Weight: 190 lb 3.2 oz (86.3 kg) IBW/kg (Calculated) : 59.3 Heparin Dosing Weight:77.8 kg  Vital Signs: Temp: 98.1 F (36.7 C) (08/04 1522) Temp Source: Oral (08/04 1522) BP: 115/68 (08/04 1715) Pulse Rate: 112 (08/04 1715)  Labs: Recent Labs    12/19/18 0605 12/20/18 0435 12/20/18 0809  12/21/18 0132 12/21/18 0857 12/21/18 1733  HGB 12.9 12.8  --   --  12.4  --   --   HCT 39.3 38.9  --   --  37.4  --   --   PLT 193 189  --   --  191  --   --   APTT  --   --  33  --   --   --   --   LABPROT  --   --  13.6  --   --   --   --   INR  --   --  1.1  --   --   --   --   HEPARINUNFRC  --   --   --    < > 0.31 0.45 0.48  CREATININE 0.45 0.43*  --   --  0.48  --   --    < > = values in this interval not displayed.    Estimated Creatinine Clearance: 68.3 mL/min (by C-G formula based on SCr of 0.48 mg/dL).   Medical History: Past Medical History:  Diagnosis Date  . Allergy   . Cancer ([place]) 09/2014   Skin Cancer  . Hyperlipidemia   . Hypertension     Medications:  Scheduled:  . allopurinol  300 mg Oral Daily  . feeding supplement (ENSURE ENLIVE)  237 mL Oral BID BM  . fluticasone  2 spray Each Nare Daily  . folic acid  1 mg Oral Daily  . loratadine  10 mg Oral Daily  . mouth rinse  15 mL Mouth Rinse BID  . metoprolol succinate  25 mg Oral Daily  . montelukast  10 mg Oral Daily  . potassium chloride  20 mEq Oral BID  . sodium chloride flush  3 mL Intravenous Q12H    Assessment: Patient admitted for SOB s/t to lung mets that has possibly metastasized to liver. Patient has PE on CT and is going for liver biopsy this am at around 0930. Patient is to be started on heparin drip @ 0945 post-biopsy.  Heparin Course: 8/3 Heparin initiated at  1200 units/hr 8/3 @6 :10 HL 0.17, Heparin 2300 units bolus, increase rate to 1500 units/hr 8/4 @ 01:32 HL 0.31, Heparin drip increased to 1550 units/hr 8/4 @ 08:57 HL 0.45 8/4 @ 1733 HL 0.48   Goal of Therapy:  Heparin level 0.3-0.7 units/ml Monitor platelets by anticoagulation protocol: Yes   Plan:  Heparin therapeutic x 2. Will recheck HL/CBC with AM labs, continue to monitor daily CBC's and adjust per anti-Xa levels.  Eleonore Chiquito, PharmD, BCPS 12/21/2018 6:10 PM

## 2018-12-21 NOTE — Consult Note (Signed)
[facility]  [phone] [phone]   Name: Elizabeth [name] Date: 12/21/2018 MRN: [id number]  DOB: Feb 09, 1946  Patient Care Team: Mar Daring, PA-C as PCP - General (Family Medicine) Dasher, Rayvon Char, MD as Consulting Physician (Dermatology) Yolonda Kida, MD as Consulting Physician (Cardiology) Dingeldein, Remo Lipps, MD as Consulting Physician (Ophthalmology) Kipp Laurence, MD as Referring Physician (Audiology) Grant Fontana, Industry as Referring Physician (Chiropractic Medicine) Watt Climes, PA as Physician Assistant (Physician Assistant)    REASON FOR CONSULTATION: Palliative Care consult requested for this 73 y.o. female with multiple medical problems including hypertension and hyperlipidemia who was admitted on 12/17/2018 with shortness of breath and worsening right hip pain.  Patient was initially sent to the ER for work-up of hypercalcemia and hyperkalemia.  CT of chest, abdomen, and pelvis revealed a large necrotic mass in the lungs most consistent with primary lung cancer with severe metastatic spread throughout the lungs, liver, bone, and abdomen.  MRI of the brain also revealed multiple metastatic brain masses.  Patient's hospitalization has been complicated by acute PE.  Work-up is ongoing.  Patient was referred to palliative care to help address goals.   SOCIAL HISTORY:     reports that she quit smoking about 15 years ago. She has a 17.50 pack-year smoking history. She has never used smokeless tobacco. She reports current alcohol use of about 14.0 standard drinks of alcohol per week. She reports that she does not use drugs.   Patient was divorced and then remarried to her current husband of the past 25 years.  She lives at home with her husband.  She has a biological son and daughter, both of whom live in [place].  She has 2 stepchildren.  Patient is a retired Oncologist.  ADVANCE  DIRECTIVES:  Not on file  CODE STATUS: Full code  PAST MEDICAL HISTORY: Past Medical History:  Diagnosis Date   Allergy    Cancer ([place]) 09/2014   Skin Cancer   Hyperlipidemia    Hypertension     PAST SURGICAL HISTORY:  Past Surgical History:  Procedure Laterality Date   ABDOMINAL HYSTERECTOMY  1989   due to menorrhagia   COLONOSCOPY     DILATION AND CURETTAGE OF UTERUS  1977   SKIN CANCER EXCISION  10/2014   on chest; Dr. Evorn Gong   TRIGGER FINGER RELEASE Right 2000   WRIST ARTHROPLASTY Left 1991    HEMATOLOGY/ONCOLOGY HISTORY:  Oncology History   No history exists.    ALLERGIES:  is allergic to furadantin  [nitrofurantoin] and phenobarbital.  MEDICATIONS:  Current Facility-Administered Medications  Medication Dose Route Frequency Provider Last Rate Last Dose   0.9 %  sodium chloride infusion  250 mL Intravenous PRN Seals, Theo Dills, NP   Stopped at 12/18/18 0709   acetaminophen (TYLENOL) tablet 650 mg  650 mg Oral Q6H PRN Gardiner Barefoot H, NP   650 mg at 12/20/18 6803   Or   acetaminophen (TYLENOL) suppository 650 mg  650 mg Rectal Q6H PRN Seals, Theo Dills, NP       allopurinol (ZYLOPRIM) tablet 300 mg  300 mg Oral Daily Earlie Server, MD   300 mg at 12/21/18 1021   feeding supplement (ENSURE ENLIVE) (ENSURE ENLIVE) liquid 237 mL  237 mL Oral BID BM Gouru, Aruna, MD   237 mL at 12/21/18 1539   fluticasone (FLONASE) 50 MCG/ACT nasal spray 2 spray  2 spray Each Nare Daily Seals, [employer]  H, NP   2 spray at 76/72/09 4709   folic acid (FOLVITE) tablet 1 mg  1 mg Oral Daily Seals, Theo Dills, NP   1 mg at 12/21/18 1021   guaiFENesin-dextromethorphan (ROBITUSSIN DM) 100-10 MG/5ML syrup 5 mL  5 mL Oral Q4H PRN Gouru, Aruna, MD   5 mL at 12/18/18 2321   heparin ADULT infusion 100 units/mL (25000 units/290m sodium chloride 0.45%)  1,550 Units/hr Intravenous Continuous Mayo, KPete Pelt MD 15.5 mL/hr at 12/21/18 1541 1,550 Units/hr at 12/21/18 1541   levalbuterol (XOPENEX)  nebulizer solution 0.63 mg  0.63 mg Nebulization Q6H PRN Gouru, AIllene Silver MD   0.63 mg at 12/21/18 1607   loratadine (CLARITIN) tablet 10 mg  10 mg Oral Daily Seals, ATheo Dills NP   10 mg at 12/21/18 1021   MEDLINE mouth rinse  15 mL Mouth Rinse BID SGardiner BarefootH, NP   15 mL at 12/21/18 1000   metoprolol succinate (TOPROL-XL) 24 hr tablet 25 mg  25 mg Oral Daily Seals, [place] DyH, NP   25 mg at 12/21/18 1022   metoprolol tartrate (LOPRESSOR) injection 5 mg  5 mg Intravenous Q6H PRN Gouru, Aruna, MD   5 mg at 12/21/18 0036   montelukast (SINGULAIR) tablet 10 mg  10 mg Oral Daily Seals, AL[place] DyH, NP   10 mg at 12/21/18 1021   morphine 2 MG/ML injection 1 mg  1 mg Intravenous Q4H PRN Mansy, Jan A, MD   1 mg at 12/20/18 2222   morphine 2 MG/ML injection 2 mg  2 mg Intravenous Q4H PRN Mansy, Jan A, MD       ondansetron (ZOFRAN) tablet 4 mg  4 mg Oral Q6H PRN Seals, ATheo Dills NP       Or   ondansetron (ZOFRAN) injection 4 mg  4 mg Intravenous Q6H PRN Seals, ATheo Dills NP       polyethylene glycol (MIRALAX / GLYCOLAX) packet 17 g  17 g Oral Daily PRN Seals, ATheo Dills NP       potassium chloride (KLOR-CON) packet 20 mEq  20 mEq Oral BID Seals, Angela H, NP   20 mEq at 12/21/18 1022   sodium chloride flush (NS) 0.9 % injection 3 mL  3 mL Intravenous Q12H Seals, Angela H, NP   3 mL at 12/21/18 1000   sodium chloride flush (NS) 0.9 % injection 3 mL  3 mL Intravenous PRN Seals, ATheo Dills NP       tiZANidine (ZANAFLEX) tablet 4 mg  4 mg Oral TID PRN PFritzi Mandes MD   4 mg at 12/21/18 1022    VITAL SIGNS: BP 115/68    Pulse (!) 112    Temp 98.1 F (36.7 C) (Oral)    Resp 20    Ht 5' 6"  (1.676 m)    Wt 190 lb 3.2 oz (86.3 kg)    SpO2 95%    BMI 30.70 kg/m  Filed Weights   12/17/18 2010  Weight: 190 lb 3.2 oz (86.3 kg)    Estimated body mass index is 30.7 kg/m as calculated from the following:   Height as of this encounter: 5' 6"  (1.676 m).   Weight as of this encounter: 190 lb 3.2 oz (86.3  kg).  LABS: CBC:    Component Value Date/Time   WBC 18.5 (H) 12/21/2018 0132   HGB 12.4 12/21/2018 0132   HGB 14.4 11/10/2017 0915   HCT 37.4 12/21/2018 0132   HCT 41.9 11/10/2017 0915  PLT 191 12/21/2018 0132   PLT 241 11/10/2017 0915   MCV 93.3 12/21/2018 0132   MCV 96 11/10/2017 0915   NEUTROABS 11.2 (H) 12/16/2018 1505   NEUTROABS 4.7 11/10/2017 0915   LYMPHSABS 1.8 12/16/2018 1505   LYMPHSABS 2.8 11/10/2017 0915   MONOABS 1.2 (H) 12/16/2018 1505   EOSABS 0.2 12/16/2018 1505   EOSABS 0.2 11/10/2017 0915   BASOSABS 0.1 12/16/2018 1505   BASOSABS 0.1 11/10/2017 0915   Comprehensive Metabolic Panel:    Component Value Date/Time   NA 136 12/21/2018 0132   NA 142 11/10/2017 0915   K 3.8 12/21/2018 0132   CL 103 12/21/2018 0132   CO2 26 12/21/2018 0132   BUN 19 12/21/2018 0132   BUN 19 11/10/2017 0915   CREATININE 0.48 12/21/2018 0132   GLUCOSE 110 (H) 12/21/2018 0132   CALCIUM 10.5 (H) 12/21/2018 0132   AST 74 (H) 12/17/2018 2035   ALT 95 (H) 12/17/2018 2035   ALKPHOS 153 (H) 12/17/2018 2035   BILITOT 0.8 12/17/2018 2035   BILITOT 0.5 11/10/2017 0915   PROT 6.4 (L) 12/17/2018 2035   PROT 6.6 11/10/2017 0915   ALBUMIN 2.9 (L) 12/17/2018 2035   ALBUMIN 4.3 11/10/2017 0915    RADIOGRAPHIC STUDIES: Dg Chest 2 View  Result Date: 12/17/2018 CLINICAL DATA:  History of cancer. Former smoker. Shortness of breath. EXAM: CHEST - 2 VIEW COMPARISON:  03/20/2016.  06/28/2012. FINDINGS: Right hilar fullness. Diffuse bilateral interstitial prominence. Multiple bilateral pulmonary nodular densities. These findings suggest malignancy with possible right hilar adenopathy, interstitial tumor spread, and metastatic disease. An inflammatory or infectious process including active granulomatous disease could also present in this fashion. Contrast-enhanced chest CT should be considered for further evaluation. No pleural effusion or pneumothorax. Heart size normal. Thoracic spine scoliosis.  IMPRESSION: Right hilar fullness, diffuse bilateral interstitial prominence, and multiple bilateral pulmonary nodular densities. These findings suggest malignancy with possible right hilar adenopathy, interstitial tumor spread, and metastatic disease. An inflammatory or infectious process including active granulomatous disease could also present this fashion. Contrast-enhanced chest CT should be considered for further evaluation. Electronically Signed   By: Marcello Moores  Register   On: 12/17/2018 08:14   Ct Chest W Contrast  Result Date: 12/17/2018 CLINICAL DATA:  Follow-up exam. History of metastasis and lung nodules. EXAM: CT CHEST, ABDOMEN, AND PELVIS WITH CONTRAST TECHNIQUE: Multidetector CT imaging of the chest, abdomen and pelvis was performed following the standard protocol during bolus administration of intravenous contrast. CONTRAST:  18m OMNIPAQUE IOHEXOL 300 MG/ML  SOLN COMPARISON:  Chest radiograph 12/16/2018; MRI lumbar spine 12/02/2018 FINDINGS: CT CHEST FINDINGS Cardiovascular: Normal heart size. Trace fluid superior pericardial recess. Thoracic aortic vascular calcifications. Mediastinum/Nodes: No axillary lymphadenopathy. There is a 1.9 cm centrally necrotic precarinal node (image 22; series 2). There is a 1.7 cm right hilar node (image 27; series 2). There is a 1.9 cm left hilar node (image 30; series 2). There is a 1.2 cm centrally necrotic nodule along the posterior left pericardium (image 38; series 2). Mild wall thickening of the esophagus. Lungs/Pleura: Central airways are patent. There is a dominant centrally necrotic 6.2 x 5.9 cm mass within the right lower lobe (image 68; series 4). There is a 2.9 cm subpleural left lower lobe nodule (image 138; series 4). There are multiple bilateral pulmonary nodules. Reference 1.7 cm left lower lobe nodule (image 115; series 4). Reference 0.8 cm left upper lobe nodule (image 42; series 4). Reference 1.5 cm right middle lobe nodule (image 66; series  4).  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 There is a moderate right pleural effusion. Musculoskeletal: No aggressive or acute appearing osseous lesions. CT ABDOMEN PELVIS FINDINGS Hepatobiliary: There are innumerable low-attenuation lesions throughout the left and right hepatic lobes, the majority are subcentimeter in size. Gallbladder is unremarkable. No intrahepatic or extrahepatic biliary ductal dilatation. Pancreas: Unremarkable Spleen: Unremarkable Adrenals/Urinary Tract: Normal adrenal glands. Kidneys enhance symmetrically with contrast. Subcentimeter too small to characterize low-attenuation lesion inferior pole left kidney (image 77; series 2). Urinary bladder is decompressed. Stomach/Bowel: Stool throughout the colon. No abnormal bowel wall thickening or evidence for bowel obstruction. No free fluid or free intraperitoneal air. Normal morphology of the stomach. Vascular/Lymphatic: Normal caliber abdominal aorta. Peripheral calcified atherosclerotic plaque. 1.3 cm left periaortic lymph node (image 71; series 2). 1.1 cm aortocaval node (image 80; series 2). There is a 1.0 cm porta hepatic node (image 65; series 2). Reproductive: Prior hysterectomy. Adnexal structures are unremarkable. Other: None. Musculoskeletal: Lumbar spine degenerative changes. There is a 6.7 x 5.7 cm lytic soft tissue mass involving the right hemi sacrum (image 93; series 2) with soft tissue involving the sacral spinal canal. There is a 2.4 cm soft tissue lytic lesion within the posterior right ilium (image 92; series 2). Additional lytic lesions within the right ilium. IMPRESSION: 1. Large centrally necrotic right lower lobe mass concerning the possibility of primary pulmonary malignancy. 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 2. Multiple enlarged and necrotic mediastinal and hilar nodes most compatible with nodal metastasis. 3. Enlarged retroperitoneal nodes compatible with metastasis. 4. Innumerable low-attenuation lesions within liver compatible metastasis. 5. Large lytic mass involving the right hemi sacrum as well as smaller lytic lesions involving the right ilium consistent with osseous metastasis. Electronically Signed   By: Lovey Newcomer M.D.   On: 12/17/2018 16:47   Ct Angio Chest Pe W Or Wo Contrast  Result Date: 12/20/2018 CLINICAL DATA:  Acute onset of tachycardia and shortness of breath. Recent diagnosis lung cancer EXAM: CT ANGIOGRAPHY CHEST WITH CONTRAST TECHNIQUE: Multidetector CT imaging of the chest was performed using the standard protocol during bolus administration of intravenous contrast. Multiplanar CT image reconstructions and MIPs were obtained to evaluate the vascular anatomy. CONTRAST:  47m OMNIPAQUE IOHEXOL 350 MG/ML SOLN COMPARISON:  12/17/2018 FINDINGS: Cardiovascular: Normal heart size. Trace pericardial fluid or thickening-stable. Possible hypertrophic left ventricle. Multiple new branching pulmonary artery filling defects that are segmental to subsegmental and demarcated on series 5. The most proximal filling defects are seen and segmental right upper lobe branches Mediastinum/Nodes: Known low-density adenopathy in the mediastinum better depicted on parenchymal phase CT performed previously. Lungs/Pleura: Known right lower lobe mass with central low-density and pulmonary metastases on both sides. 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 Small right pleural effusion. Upper Abdomen: Extensive hepatic metastatic disease. Musculoskeletal: No acute finding. Please see Z vision log concerning efforts to reach on-call physician.  Overhead paging is disallowed. Critical Value/emergent results were called by telephone at the time of interpretation on 12/20/2018 at 6:51 am to Dr. MSidney Ace who verbally acknowledged these results. Review of the MIP images confirms the above findings. IMPRESSION: 1. Positive for multifocal segmental and subsegmental acute pulmonary emboli.No indication of right heart strain.  2. Extensive thoracic and intra-abdominal malignancy as recently staged. Electronically Signed   By: Monte Fantasia M.D.   On: 12/20/2018 06:51   Mr Jeri Cos ZL Contrast  Result Date: 12/21/2018 CLINICAL DATA:  Non-small-cell lung cancer staging EXAM: MRI HEAD WITHOUT AND WITH CONTRAST TECHNIQUE: Multiplanar, multiecho pulse sequences of the brain and surrounding structures were obtained without and with intravenous contrast. CONTRAST:  8 mL Gadovist IV COMPARISON:  None. FINDINGS: Brain: Image quality degraded by motion especially the postcontrast images. Multiple enhancing lesions the brain compatible with metastatic disease. Axial images significantly degraded by motion. Coronal and sagittal images have less motion. 8 mm enhancing lesion right occipital lobe 6 mm enhancing lesion right posterior temporal lobe above the tentorium 9 mm lesion right parietal cortex 3 mm lesion right parietal lobe 5 mm left inferior temporal lobe 5 mm right frontal lesion 5 mm left frontal lesion. No significant brain edema or midline shift. No acute infarct. Negative for hemorrhage Vascular: Normal arterial flow voids Skull and upper cervical spine: No focal skeletal lesion. 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 All less than 1 cm. No significant edema. Image quality degraded by motion which could limit detection of small lesions. Electronically Signed   By: Franchot Gallo M.D.   On: 12/21/2018 11:59   Mr Lumbar Spine Wo Contrast  Result Date: 12/03/2018 CLINICAL DATA:  Sciatica right side. Right back  and leg pain 4-5 months. EXAM: MRI LUMBAR SPINE WITHOUT CONTRAST TECHNIQUE: Multiplanar, multisequence MR imaging of the lumbar spine was performed. No intravenous contrast was administered. COMPARISON:  None. FINDINGS: Segmentation:  Normal Alignment:  4 mm anterolisthesis L4-5.  Mild dextroscoliosis. Vertebrae: 1 cm hyperintense lesion in the left posterior T12 vertebral body best seen on inversion recovery. Infiltrating mass lesion in the right sacrum involving the right sacrum. Tumor involves the right S1 vertebral body and pedicle and extends into the right S2 vertebral body. Tumor is low signal on T1 and hyperintense on T2. Extraosseous tumor with narrowing the foramen at L5-S1 and S1-2. Tumor also infiltrates the posterior iliac bone adjacent to the SI joint. Small right SI joint effusion. Conus medullaris and cauda equina: Conus extends to the L1-2 level. Conus and cauda equina appear normal. Paraspinal and other soft tissues: Negative for retroperitoneal adenopathy. Disc levels: L1-2: Mild disc and facet degeneration without stenosis L2-3: Moderate disc degeneration with disc bulging and spurring. Bilateral facet hypertrophy and mild spinal stenosis. L3-4: Disc degeneration and spondylosis. Bilateral facet hypertrophy. Moderate spinal stenosis. Mild to moderate subarticular stenosis bilaterally L4-5: 4 mm anterolisthesis. Diffuse bulging of the disc with moderate to severe facet degeneration. Moderate to severe spinal stenosis. Mild subarticular stenosis bilaterally L5-S1: Bilateral facet hypertrophy. Right foraminal encroachment due to tumor extension and spurring. 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 Tumor also infiltrates right posterior iliac bone. 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 Right L5 and S1 foraminal encroachment due to tumor extension into the soft tissues. Small lesion T12 also supportive of metastatic  disease. Chondrosarcoma or lymphoma right sacrum less likely. These results were called by telephone at the time of interpretation on 12/03/2018 at 10:27 am to [facility] , who verbally acknowledged these results. Electronically Signed   By: Franchot Gallo M.D.   On: 12/03/2018 10:36   Ct Abdomen Pelvis W Contrast  Result Date: 12/17/2018 CLINICAL DATA:  Follow-up exam. History of metastasis and lung nodules. EXAM:  CT CHEST, ABDOMEN, AND PELVIS WITH CONTRAST TECHNIQUE: Multidetector CT imaging of the chest, abdomen and pelvis was performed following the standard protocol during bolus administration of intravenous contrast. CONTRAST:  118m OMNIPAQUE IOHEXOL 300 MG/ML  SOLN COMPARISON:  Chest radiograph 12/16/2018; MRI lumbar spine 12/02/2018 FINDINGS: CT CHEST FINDINGS Cardiovascular: Normal heart size. Trace fluid superior pericardial recess. Thoracic aortic vascular calcifications. Mediastinum/Nodes: No axillary lymphadenopathy. There is a 1.9 cm centrally necrotic precarinal node (image 22; series 2). There is a 1.7 cm right hilar node (image 27; series 2). There is a 1.9 cm left hilar node (image 30; series 2). There is a 1.2 cm centrally necrotic nodule along the posterior left pericardium (image 38; series 2). Mild wall thickening of the esophagus. Lungs/Pleura: Central airways are patent. There is a dominant centrally necrotic 6.2 x 5.9 cm mass within the right lower lobe (image 68; series 4). There is a 2.9 cm subpleural left lower lobe nodule (image 138; series 4). There are multiple bilateral pulmonary nodules. Reference 1.7 cm left lower lobe nodule (image 115; series 4). Reference 0.8 cm left upper lobe nodule (image 42; series 4). Reference 1.5 cm right middle lobe nodule (image 66; series 4). Within the right lung involving the right upper, right middle and right lower lobes as well as within the central left upper and left lower lobes there is nodular interlobular septal thickening concerning  for lymphangitic carcinomatosis. There is a moderate right pleural effusion. Musculoskeletal: No aggressive or acute appearing osseous lesions. CT ABDOMEN PELVIS FINDINGS Hepatobiliary: There are innumerable low-attenuation lesions throughout the left and right hepatic lobes, the majority are subcentimeter in size. Gallbladder is unremarkable. No intrahepatic or extrahepatic biliary ductal dilatation. Pancreas: Unremarkable Spleen: Unremarkable Adrenals/Urinary Tract: Normal adrenal glands. Kidneys enhance symmetrically with contrast. Subcentimeter too small to characterize low-attenuation lesion inferior pole left kidney (image 77; series 2). Urinary bladder is decompressed. Stomach/Bowel: Stool throughout the colon. No abnormal bowel wall thickening or evidence for bowel obstruction. No free fluid or free intraperitoneal air. Normal morphology of the stomach. Vascular/Lymphatic: Normal caliber abdominal aorta. Peripheral calcified atherosclerotic plaque. 1.3 cm left periaortic lymph node (image 71; series 2). 1.1 cm aortocaval node (image 80; series 2). There is a 1.0 cm porta hepatic node (image 65; series 2). Reproductive: Prior hysterectomy. Adnexal structures are unremarkable. Other: None. Musculoskeletal: Lumbar spine degenerative changes. There is a 6.7 x 5.7 cm lytic soft tissue mass involving the right hemi sacrum (image 93; series 2) with soft tissue involving the sacral spinal canal. There is a 2.4 cm soft tissue lytic lesion within the posterior right ilium (image 92; series 2). Additional lytic lesions within the right ilium. IMPRESSION: 1. Large centrally necrotic right lower lobe mass concerning the possibility of primary pulmonary malignancy. 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 2. Multiple enlarged and necrotic mediastinal and hilar nodes most  compatible with nodal metastasis. 3. Enlarged retroperitoneal nodes compatible with metastasis. 4. Innumerable low-attenuation lesions within liver compatible metastasis. 5. Large lytic mass involving the right hemi sacrum as well as smaller lytic lesions involving the right ilium consistent with osseous metastasis. Electronically Signed   By: DLovey NewcomerM.D.   On: 12/17/2018 16:47   UKoreaVenous Img Lower Bilateral  Result Date: 12/20/2018 CLINICAL DATA:  Metastatic cancer, pulmonary emboli, anticoagulated EXAM: BILATERAL LOWER EXTREMITY VENOUS DOPPLER ULTRASOUND TECHNIQUE: Gray-scale sonography with graded compression, as well as  color Doppler and duplex ultrasound were performed to evaluate the lower extremity deep venous systems from the level of the common femoral vein and including the common femoral, femoral, profunda femoral, popliteal and calf veins including the posterior tibial, peroneal and gastrocnemius veins when visible. The superficial great saphenous vein was also interrogated. Spectral Doppler was utilized to evaluate flow at rest and with distal augmentation maneuvers in the common femoral, femoral and popliteal veins. COMPARISON:  None. FINDINGS: RIGHT LOWER EXTREMITY Common Femoral Vein: No evidence of thrombus. Normal compressibility, respiratory phasicity and response to augmentation. Saphenofemoral Junction: No evidence of thrombus. Normal compressibility and flow on color Doppler imaging. Profunda Femoral Vein: No evidence of thrombus. Normal compressibility and flow on color Doppler imaging. Femoral Vein: No evidence of thrombus. Normal compressibility, respiratory phasicity and response to augmentation. Popliteal Vein: No evidence of thrombus. Normal compressibility, respiratory phasicity and response to augmentation. Calf Veins: No evidence of thrombus. Normal compressibility and flow on color Doppler imaging. Superficial Great Saphenous Vein: No evidence of thrombus. Normal  compressibility. Venous Reflux:  Not assessed Other Findings:  None. LEFT LOWER EXTREMITY Common Femoral Vein: No evidence of thrombus. Normal compressibility, respiratory phasicity and response to augmentation. Saphenofemoral Junction: No evidence of thrombus. Normal compressibility and flow on color Doppler imaging. Profunda Femoral Vein: No evidence of thrombus. Normal compressibility and flow on color Doppler imaging. Femoral Vein: No evidence of thrombus. Normal compressibility, respiratory phasicity and response to augmentation. Popliteal Vein: No evidence of thrombus. Normal compressibility, respiratory phasicity and response to augmentation. Calf Veins: Left posterior tibial calf veins demonstrate intraluminal hypoechoic thrombus over a short segment. Vessel is noncompressible. Very low thrombus burden. Other calf veins appear patent. Superficial Great Saphenous Vein: No evidence of thrombus. Normal compressibility. Left small saphenous vein also demonstrates hypoechoic thrombus. Vessel is noncompressible. Very low thrombus burden. Findings compatible with left small saphenous vein superficial thrombosis. Venous Reflux:  Not assessed Other Findings:  None. IMPRESSION: Negative for right lower extremity DVT. Positive for left calf posterior tibial DVT. Very low thrombus burden. No propagation into the popliteal or femoral veins. Associated left small saphenous vein superficial thrombosis/thrombophlebitis. Electronically Signed   By: Jerilynn Mages.  Shick M.D.   On: 12/20/2018 15:49    PERFORMANCE STATUS (ECOG) : 3 - Symptomatic, >50% confined to bed  Review of Systems Unless otherwise noted, a complete review of systems is negative.  Physical Exam General: NAD, frail appearing, thin Pulmonary: On O2, somewhat labored breathing with talking Extremities: no edema Skin: no rashes Neurological: Weakness but otherwise nonfocal  IMPRESSION: I met with patient and husband today to discuss goals.  I introduced  palliative care services and attempted to establish therapeutic rapport.  Together, we reviewed patient's multiple medical problems.  Both patient and husband seem to recognize the severity of her current illness.  Both remain committed to pursuing ongoing work-up and initiation of treatment if any options are available.  Patient verbalized an optimistic outlook with hope that treatment would improve her clinical situation.  She says her primary goal is to maintain functioning and independence.  We discussed advance care planning.  Patient completed a living will and healthcare power of attorney documents about 10 years ago.  Husband will find these documents and bring a copy to the hospital.  Patient would want her husband to be her decision-maker if needed.  We discussed CODE STATUS.  Patient says that she is contemplating a DNR but that has thought about requesting one attempt at resuscitation.  I explained  that resuscitation would most likely prove futile in the setting of advanced cancer.  I encouraged patient and husband to discuss these matters as a family prior to decision making.  PLAN: -Continue current scope of treatment -Patient's goals seem aligned with continued work-up in pursuit of treatment -We will plan to follow patient in the clinic post discharge   Patient expressed understanding and was in agreement with this plan. She also understands that She can call the clinic at any time with any questions, concerns, or complaints.     Time Total: 60 minutes  Visit consisted of counseling and education dealing with the complex and emotionally intense issues of symptom management and palliative care in the setting of serious and potentially life-threatening illness.Greater than 50%  of this time was spent counseling and coordinating care related to the above assessment and plan.  Signed by: Altha Harm, PhD, NP-C [phone] (Work Cell)

## 2018-12-21 NOTE — Care Management Important Message (Signed)
Important Message  Patient Details  Name: Elizabeth [name] MRN: [id number] Date of Birth: 1945-12-28   Medicare Important Message Given:  Yes     Juliann Pulse A Duane Trias 12/21/2018, 11:12 AM

## 2018-12-21 NOTE — Progress Notes (Addendum)
[facility] [place] NAME: Elizabeth [name]    MR#:  [id number]  DATE OF BIRTH:  [birth year]  SUBJECTIVE:   Feeling better today.  States her shortness of breath has improved.  She is still noting some dyspnea on exertion.  She denies any chest pain or palpitations.  She has been weaned from 8 L HFNC to 4 L by [place] today.  REVIEW OF SYSTEMS:  CONSTITUTIONAL: No fever, fatigue or weakness.  EYES: No blurred or double vision.  EARS, NOSE, AND THROAT: No tinnitus or ear pain.  RESPIRATORY: No cough,  or hemoptysis. + Dyspnea on exertion CARDIOVASCULAR: No chest pain, orthopnea, edema.  GASTROINTESTINAL: No nausea, vomiting, diarrhea or abdominal pain.  GENITOURINARY: No dysuria, hematuria.  ENDOCRINE: No polyuria, nocturia HEMATOLOGY: No anemia, easy bruising or bleeding SKIN: No rash or lesion. MUSCULOSKELETAL: No joint pain or arthritis.   NEUROLOGIC: No tingling, numbness, weakness.  PSYCHIATRY: No anxiety or depression.   DRUG ALLERGIES:   Allergies  Allergen Reactions  . Furadantin  [Nitrofurantoin]   . Phenobarbital Rash    VITALS:  Blood pressure (!) 114/56, pulse (!) 113, temperature 99.1 F (37.3 C), temperature source Oral, resp. rate (!) 22, height 5\' 6"  (1.676 m), weight 86.3 kg, SpO2 93 %.  PHYSICAL EXAMINATION:  GENERAL:  73 y.o.-year-old patient lying in the bed with no acute distress.   EYES: Pupils equal, round, reactive to light and accommodation. No scleral icterus. Extraocular muscles intact.  HEENT: Head atraumatic, normocephalic. Oropharynx and nasopharynx clear.  NECK:  Supple, no jugular venous distention. No thyroid enlargement, no tenderness.  LUNGS: + Diminished breath sounds in the lung bases bilaterally, no wheezing, rales,rhonchi or crepitation. No use of accessory muscles of respiration.  CARDIOVASCULAR: Tachycardic, regular rhythm, S1, S2 normal. No murmurs, rubs, or gallops.  ABDOMEN: Soft, nontender,  nondistended. Bowel sounds present.  EXTREMITIES: No pedal edema, cyanosis, or clubbing.  NEUROLOGIC: Cranial nerves II through XII are intact. Muscle strength 5/5 in all extremities. Sensation intact. Gait not checked.  PSYCHIATRIC: The patient is alert and oriented x 3.  SKIN: No obvious rash, lesion, or ulcer.    LABORATORY PANEL:   CBC Recent Labs  Lab 12/21/18 0132  WBC 18.5*  HGB 12.4  HCT 37.4  PLT 191   ------------------------------------------------------------------------------------------------------------------  Chemistries  Recent Labs  Lab 12/17/18 2035  12/21/18 0132  NA 137   < > 136  K 2.7*   < > 3.8  CL 102   < > 103  CO2 24   < > 26  GLUCOSE 134*   < > 110*  BUN 26*   < > 19  CREATININE 0.83   < > 0.48  CALCIUM 12.2*   < > 10.5*  AST 74*  --   --   ALT 95*  --   --   ALKPHOS 153*  --   --   BILITOT 0.8  --   --    < > = values in this interval not displayed.   ------------------------------------------------------------------------------------------------------------------  Cardiac Enzymes No results for input(s): TROPONINI in the last 168 hours. ------------------------------------------------------------------------------------------------------------------  RADIOLOGY:  Ct Angio Chest Pe W Or Wo Contrast  Result Date: 12/20/2018 CLINICAL DATA:  Acute onset of tachycardia and shortness of breath. Recent diagnosis lung cancer EXAM: CT ANGIOGRAPHY CHEST WITH CONTRAST TECHNIQUE: Multidetector CT imaging of the chest was performed using the standard protocol during bolus administration of intravenous contrast. Multiplanar CT image reconstructions and  MIPs were obtained to evaluate the vascular anatomy. CONTRAST:  81mL OMNIPAQUE IOHEXOL 350 MG/ML SOLN COMPARISON:  12/17/2018 FINDINGS: Cardiovascular: Normal heart size. Trace pericardial fluid or thickening-stable. Possible hypertrophic left ventricle. Multiple new branching pulmonary artery filling  defects that are segmental to subsegmental and demarcated on series 5. The most proximal filling defects are seen and segmental right upper lobe branches Mediastinum/Nodes: Known low-density adenopathy in the mediastinum better depicted on parenchymal phase CT performed previously. Lungs/Pleura: Known right lower lobe mass with central low-density and pulmonary metastases on both sides. 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 Small right pleural effusion. Upper Abdomen: Extensive hepatic metastatic disease. Musculoskeletal: No acute finding. Please see Z vision log concerning efforts to reach on-call physician. Overhead paging is disallowed. Critical Value/emergent results were called by telephone at the time of interpretation on 12/20/2018 at 6:51 am to Dr. Sidney Ace, who verbally acknowledged these results. Review of the MIP images confirms the above findings. IMPRESSION: 1. Positive for multifocal segmental and subsegmental acute pulmonary emboli.No indication of right heart strain. 2. Extensive thoracic and intra-abdominal malignancy as recently staged. Electronically Signed   By: Monte Fantasia M.D.   On: 12/20/2018 06:51   US Venous Img Lower Bilateral  Result Date: 12/20/2018 CLINICAL DATA:  Metastatic cancer, pulmonary emboli, anticoagulated EXAM: BILATERAL LOWER EXTREMITY VENOUS DOPPLER ULTRASOUND TECHNIQUE: Gray-scale sonography with graded compression, as well as color Doppler and duplex ultrasound were performed to evaluate the lower extremity deep venous systems from the level of the common femoral vein and including the common femoral, femoral, profunda femoral, popliteal and calf veins including the posterior tibial, peroneal and gastrocnemius veins when visible. The superficial great saphenous vein was also interrogated. Spectral Doppler was utilized to evaluate flow at rest and with distal augmentation maneuvers in the common femoral, femoral and popliteal  veins. COMPARISON:  None. FINDINGS: RIGHT LOWER EXTREMITY Common Femoral Vein: No evidence of thrombus. Normal compressibility, respiratory phasicity and response to augmentation. Saphenofemoral Junction: No evidence of thrombus. Normal compressibility and flow on color Doppler imaging. Profunda Femoral Vein: No evidence of thrombus. Normal compressibility and flow on color Doppler imaging. Femoral Vein: No evidence of thrombus. Normal compressibility, respiratory phasicity and response to augmentation. Popliteal Vein: No evidence of thrombus. Normal compressibility, respiratory phasicity and response to augmentation. Calf Veins: No evidence of thrombus. Normal compressibility and flow on color Doppler imaging. Superficial Great Saphenous Vein: No evidence of thrombus. Normal compressibility. Venous Reflux:  Not assessed Other Findings:  None. LEFT LOWER EXTREMITY Common Femoral Vein: No evidence of thrombus. Normal compressibility, respiratory phasicity and response to augmentation. Saphenofemoral Junction: No evidence of thrombus. Normal compressibility and flow on color Doppler imaging. Profunda Femoral Vein: No evidence of thrombus. Normal compressibility and flow on color Doppler imaging. Femoral Vein: No evidence of thrombus. Normal compressibility, respiratory phasicity and response to augmentation. Popliteal Vein: No evidence of thrombus. Normal compressibility, respiratory phasicity and response to augmentation. Calf Veins: Left posterior tibial calf veins demonstrate intraluminal hypoechoic thrombus over a short segment. Vessel is noncompressible. Very low thrombus burden. Other calf veins appear patent. Superficial Great Saphenous Vein: No evidence of thrombus. Normal compressibility. Left small saphenous vein also demonstrates hypoechoic thrombus. Vessel is noncompressible. Very low thrombus burden. Findings compatible with left small saphenous vein superficial thrombosis. Venous Reflux:  Not assessed  Other Findings:  None. IMPRESSION: Negative for right lower extremity DVT. Positive for left calf posterior tibial DVT. Very low thrombus burden. No propagation into the popliteal or femoral veins. Associated  left small saphenous vein superficial thrombosis/thrombophlebitis. Electronically Signed   By: Jerilynn Mages.  Shick M.D.   On: 12/20/2018 15:49    EKG:   Orders placed or performed during the hospital encounter of 12/17/18  . EKG 12-Lead  . EKG 12-Lead    ASSESSMENT AND PLAN:    Acute hypoxic respiratory failure- secondary to right lower lobe lung mass and multifocal PE.  Improving.  Patient is down 4 L O2. -CTA chest was positive for multifocal PE -Continue heparin gtt -ECHO without evidence of right heart strain -DuoNebs PRN -Continue supplemental oxygen, wean as able  Left lower extremity DVT- seen on duplex US. 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 -Oncology following -Will order MRI brain for staging, per oncology recommendations -Per oncology, patient will need 48 hours of anticoagulation prior to disrupting this for her liver biopsy -Palliative care consulted  Leukocytosis-likely stress reaction.  She did have a low-grade fever last night, but feel this is more likely related to her PE. -UA on admission was negative -CTA chest did not show any evidence of pneumonia -Check a procalcitonin -Repeat CBC in the morning  Hypercalcemia- improving, likely secondary to malignancy. -s/p IV Zometa x 1  Hypertension- BP well controlled -Continue metoprolol -Holding home losartan with recent contrast load  All the records are reviewed and case discussed with Care Management/Social Workerr. Management plans discussed with the patient,she is  in agreement.  Patient said that she would update to the family members and requesting not to talk to her family  members at this point of time  CODE STATUS: Full code  TOTAL TIME TAKING CARE OF THIS PATIENT: 40 minutes.   POSSIBLE D/C IN 3-4 DAYS, DEPENDING ON CLINICAL CONDITION.  Note: This dictation was prepared with Dragon dictation along with smaller phrase technology. Any transcriptional errors that result from this process are unintentional.   Evette Doffing M.D on 12/21/2018 at 11:38 AM  Between 7am to 6pm - Pager - [phone]  After 6pm go to [email or web address] - password [email or web address] [facility]  Office  [phone]  CC: Primary care physician; Mar Daring, PA-C

## 2018-12-21 NOTE — Progress Notes (Signed)
Patients HR was running between 130s - 140s she had Lopressor at 1833 with no relief. Messaged [place], NP new order to give an additional 5mg  IV dose of Lopressor.  Later the pts HR only came down to around 117, [employer], NP she told me to wait until the next dose of Lopressor which was at 0033 and give that. It was given and patients HR is now 108-118.

## 2018-12-21 NOTE — Progress Notes (Signed)
[facility] for heparin Indication: pulmonary embolus  Allergies  Allergen Reactions  . Furadantin  [Nitrofurantoin]   . Phenobarbital Rash    Patient Measurements: Height: 5\' 6"  (167.6 cm) Weight: 190 lb 3.2 oz (86.3 kg) IBW/kg (Calculated) : 59.3 Heparin Dosing Weight:77.8 kg  Vital Signs: Temp: 98.3 F (36.8 C) (08/04 1027) Temp Source: Oral (08/04 1027) BP: 114/56 (08/04 1027) Pulse Rate: 113 (08/04 1027)  Labs: Recent Labs    12/19/18 0605 12/20/18 0435 12/20/18 0809 12/20/18 1610 12/21/18 0132 12/21/18 0857  HGB 12.9 12.8  --   --  12.4  --   HCT 39.3 38.9  --   --  37.4  --   PLT 193 189  --   --  191  --   APTT  --   --  33  --   --   --   LABPROT  --   --  13.6  --   --   --   INR  --   --  1.1  --   --   --   HEPARINUNFRC  --   --   --  0.17* 0.31 0.45  CREATININE 0.45 0.43*  --   --  0.48  --     Estimated Creatinine Clearance: 68.3 mL/min (by C-G formula based on SCr of 0.48 mg/dL).   Medical History: Past Medical History:  Diagnosis Date  . Allergy   . Cancer ([place]) 09/2014   Skin Cancer  . Hyperlipidemia   . Hypertension     Medications:  Scheduled:  . allopurinol  300 mg Oral Daily  . feeding supplement (ENSURE ENLIVE)  237 mL Oral BID BM  . fluticasone  2 spray Each Nare Daily  . folic acid  1 mg Oral Daily  . gabapentin  300 mg Oral BID  . loratadine  10 mg Oral Daily  . mouth rinse  15 mL Mouth Rinse BID  . metoprolol succinate  25 mg Oral Daily  . montelukast  10 mg Oral Daily  . potassium chloride  20 mEq Oral BID  . sodium chloride flush  3 mL Intravenous Q12H    Assessment: Patient admitted for SOB s/t to lung mets that has possibly metastasized to liver. Patient has PE on CT and is going for liver biopsy this am at around 0930. Patient is to be started on heparin drip @ 0945 post-biopsy.  Heparin Course: 8/3 Heparin initiated at 1200 units/hr 8/3 @6 :10 HL 0.17, Heparin 2300 units  bolus, increase rate to 1500 units/hr 8/4 @ 01:32 HL 0.31, Heparin drip increased to 1550 units/hr 8/4 @ 08:57 HL 0.45  Goal of Therapy:  Heparin level 0.3-0.7 units/ml Monitor platelets by anticoagulation protocol: Yes   Plan:  8/4  HL @ 0857 = 0.45.  Heparin therapeutic times one. Will recheck HL in 8 hours for confirmation, continue to monitor daily CBC's and adjust per anti-Xa levels.  Paulina Fusi, PharmD, BCPS 12/21/2018 12:26 PM

## 2018-12-21 NOTE — Progress Notes (Signed)
Telemetry called and stated that patients O2 sats were 89. They had dropped to the lowest 87. Elizabeth [name] with respiratory and he said to bump her O2 up to 6LPM. Once it was up to 6LPM her O2 sat is up to 93%.

## 2018-12-21 NOTE — Progress Notes (Signed)
[facility] for heparin Indication: pulmonary embolus  Allergies  Allergen Reactions  . Furadantin  [Nitrofurantoin]   . Phenobarbital Rash    Patient Measurements: Height: 5\' 6"  (167.6 cm) Weight: 190 lb 3.2 oz (86.3 kg) IBW/kg (Calculated) : 59.3 Heparin Dosing Weight:77.8 kg  Vital Signs: Temp: 100.4 F (38 C) (08/03 2008) Temp Source: Oral (08/03 2008) BP: 130/80 (08/03 2221) Pulse Rate: 131 (08/03 2008)  Labs: Recent Labs    12/18/18 9604 12/19/18 5409 12/20/18 0435 12/20/18 0809 12/20/18 1610 12/21/18 0132  HGB  --  12.9 12.8  --   --  12.4  HCT  --  39.3 38.9  --   --  37.4  PLT  --  193 189  --   --  191  APTT  --   --   --  33  --   --   LABPROT 13.3  --   --  13.6  --   --   INR 1.0  --   --  1.1  --   --   HEPARINUNFRC  --   --   --   --  0.17* 0.31  CREATININE  --  0.45 0.43*  --   --  0.48    Estimated Creatinine Clearance: 68.3 mL/min (by C-G formula based on SCr of 0.48 mg/dL).   Medical History: Past Medical History:  Diagnosis Date  . Allergy   . Cancer ([place]) 09/2014   Skin Cancer  . Hyperlipidemia   . Hypertension     Medications:  Scheduled:  . allopurinol  300 mg Oral Daily  . feeding supplement (ENSURE ENLIVE)  237 mL Oral BID BM  . fluticasone  2 spray Each Nare Daily  . folic acid  1 mg Oral Daily  . gabapentin  300 mg Oral BID  . loratadine  10 mg Oral Daily  . mouth rinse  15 mL Mouth Rinse BID  . metoprolol succinate  25 mg Oral Daily  . montelukast  10 mg Oral Daily  . potassium chloride  20 mEq Oral BID  . sodium chloride flush  3 mL Intravenous Q12H    Assessment: Patient admitted for SOB s/t to lung mets that has possibly metastasized to liver. Patient has PE on CT and is going for liver biopsy this am at around 0930. Patient is to be started on heparin drip @ 0945 post-biopsy.  Will not bolus since patient will be post-op and has received two days of VTE prophylaxis  lovenox. Will start heparin drip at 1200 units/hr start 0945 post-biopsy.  Goal of Therapy:  Heparin level 0.3-0.7 units/ml Monitor platelets by anticoagulation protocol: Yes   Plan:  8/4  HL @ 0132 = 0.31.  Heparin level barely therapeutic therefore will increase rate to 1550 units/hr and recheck heparin level in 6 hours, continue to monitor daily CBC's and adjust per anti-Xa levels.  Hart Robinsons, PharmD 12/21/2018

## 2018-12-22 ENCOUNTER — Inpatient Hospital Stay: Payer: Medicare Other

## 2018-12-22 ENCOUNTER — Ambulatory Visit: Payer: Medicare Other

## 2018-12-22 DIAGNOSIS — M899 Disorder of bone, unspecified: Secondary | ICD-10-CM

## 2018-12-22 DIAGNOSIS — J9600 Acute respiratory failure, unspecified whether with hypoxia or hypercapnia: Secondary | ICD-10-CM

## 2018-12-22 LAB — HEPARIN LEVEL (UNFRACTIONATED)
Heparin Unfractionated: 0.21 IU/mL — ABNORMAL LOW (ref 0.30–0.70)
Heparin Unfractionated: 0.24 IU/mL — ABNORMAL LOW (ref 0.30–0.70)

## 2018-12-22 LAB — CBC
HCT: 36.6 % (ref 36.0–46.0)
Hemoglobin: 12 g/dL (ref 12.0–15.0)
MCH: 30.7 pg (ref 26.0–34.0)
MCHC: 32.8 g/dL (ref 30.0–36.0)
MCV: 93.6 fL (ref 80.0–100.0)
Platelets: 205 10*3/uL (ref 150–400)
RBC: 3.91 MIL/uL (ref 3.87–5.11)
RDW: 13 % (ref 11.5–15.5)
WBC: 16 10*3/uL — ABNORMAL HIGH (ref 4.0–10.5)
nRBC: 0 % (ref 0.0–0.2)

## 2018-12-22 LAB — BASIC METABOLIC PANEL
Anion gap: 10 (ref 5–15)
BUN: 20 mg/dL (ref 8–23)
CO2: 25 mmol/L (ref 22–32)
Calcium: 9.8 mg/dL (ref 8.9–10.3)
Chloride: 102 mmol/L (ref 98–111)
Creatinine, Ser: 0.48 mg/dL (ref 0.44–1.00)
GFR calc Af Amer: >60 mL/min (ref >60)
GFR calc non Af Amer: >60 mL/min (ref >60)
Glucose, Bld: 102 mg/dL — ABNORMAL HIGH (ref 70–99)
Potassium: 3.7 mmol/L (ref 3.5–5.1)
Sodium: 137 mmol/L (ref 135–145)

## 2018-12-22 LAB — PROCALCITONIN: Procalcitonin: 0.68 ng/mL

## 2018-12-22 MED ORDER — FENTANYL CITRATE (PF) 100 MCG/2ML IJ SOLN
INTRAMUSCULAR | Status: AC
  Filled 2018-12-22: qty 2

## 2018-12-22 MED ORDER — SODIUM CHLORIDE 0.9 % IV SOLN
INTRAVENOUS | Status: AC | PRN
  Administered 2018-12-22: 10 mL/h via INTRAVENOUS

## 2018-12-22 MED ORDER — FENTANYL CITRATE (PF) 100 MCG/2ML IJ SOLN
INTRAMUSCULAR | Status: AC | PRN
  Administered 2018-12-22: 50 ug via INTRAVENOUS

## 2018-12-22 MED ORDER — SODIUM CHLORIDE 0.9 % IV SOLN
1.0000 g | INTRAVENOUS | Status: DC
  Administered 2018-12-22 – 2018-12-25 (×4): 1 g via INTRAVENOUS
  Filled 2018-12-22 (×4): qty 1
  Filled 2018-12-22: qty 10

## 2018-12-22 MED ORDER — MIDAZOLAM HCL 2 MG/2ML IJ SOLN
INTRAMUSCULAR | Status: AC | PRN
  Administered 2018-12-22: 1 mg via INTRAVENOUS

## 2018-12-22 MED ORDER — MIDAZOLAM HCL 2 MG/2ML IJ SOLN
INTRAMUSCULAR | Status: AC
  Filled 2018-12-22: qty 4

## 2018-12-22 MED ORDER — SODIUM CHLORIDE 0.9 % IV SOLN
500.0000 mg | INTRAVENOUS | Status: DC
  Administered 2018-12-22 – 2018-12-23 (×2): 500 mg via INTRAVENOUS
  Filled 2018-12-22 (×3): qty 500

## 2018-12-22 MED ORDER — HEPARIN BOLUS VIA INFUSION
1200.0000 [IU] | Freq: Once | INTRAVENOUS | Status: AC
  Administered 2018-12-22: 06:00:00 1200 [IU] via INTRAVENOUS
  Filled 2018-12-22: qty 1200

## 2018-12-22 MED ORDER — HEPARIN BOLUS VIA INFUSION
1200.0000 [IU] | Freq: Once | INTRAVENOUS | Status: AC
  Administered 2018-12-22: 1200 [IU] via INTRAVENOUS
  Filled 2018-12-22: qty 1200

## 2018-12-22 NOTE — Progress Notes (Addendum)
Hematology/Oncology Follow Up Note [facility]  Telephone:([phone] [phone]  Patient Care Team: Rubye Beach as PCP - General (Family Medicine) Dasher, Rayvon Char, MD as Consulting Physician (Dermatology) Yolonda Kida, MD as Consulting Physician (Cardiology) Dingeldein, Remo Lipps, MD as Consulting Physician (Ophthalmology) Kipp Laurence, MD as Referring Physician (Audiology) Grant Fontana, Jennette as Referring Physician (Chiropractic Medicine) Watt Climes, PA as Physician Assistant (Physician Assistant)   Name of the patient: Ranetta Armacost  [id number]  Mar 08, 1946   INTERVAL HISTORY Patient is lying in bed, breathing via high flow oxygen. No acute overnight events. She is temporarily off heparin drip for biopsy this morning.  She is going to resume heparin this afternoon  She reports that she could not sleep very well last night. Pain is well controlled with IV morphine. Heart rate is high.  Metoprolol was ordered.  Review of Systems  Constitutional: Positive for appetite change, fatigue and unexpected weight change. Negative for chills and fever.  HENT:   Negative for hearing loss and voice change.   Eyes: Negative for eye problems.  Respiratory: Positive for shortness of breath. Negative for chest tightness and cough.   Cardiovascular: Negative for chest pain.  Gastrointestinal: Negative for abdominal distention, abdominal pain and blood in stool.  Endocrine: Negative for hot flashes.  Genitourinary: Negative for difficulty urinating and frequency.   Musculoskeletal: Positive for back pain. Negative for arthralgias.  Skin: Negative for itching and rash.  Neurological: Negative for extremity weakness.  Hematological: Negative for adenopathy.  Psychiatric/Behavioral: Negative for confusion.      Allergies  Allergen Reactions   Furadantin  [Nitrofurantoin]    Phenobarbital Rash     Past Medical History:  Diagnosis  Date   Allergy    Cancer ([place]) 09/2014   Skin Cancer   Hyperlipidemia    Hypertension      Past Surgical History:  Procedure Laterality Date   ABDOMINAL HYSTERECTOMY  1989   due to menorrhagia   COLONOSCOPY     [place]   SKIN CANCER EXCISION  10/2014   on chest; Dr. Evorn Gong   TRIGGER FINGER RELEASE Right 2000   WRIST ARTHROPLASTY Left 1991    Social History   Socioeconomic History   Marital status: Married    Spouse name: Eulas Post   Number of children: 2   Years of education: Not on file   Highest education level: Bachelor's degree (e.g., BA, AB, BS)  Occupational History   Occupation: retired  Scientist, product/process development strain: Not hard at [employer]    Worry: Never true    Inability: Never true   Transportation needs    Medical: No    Non-medical: No  Tobacco Use   Smoking status: Former Smoker    Packs/day: 0.50    Years: 35.00    Pack years: 17.50    Quit date: 05/20/2003    Years since quitting: 15.6   Smokeless tobacco: Never Used  Substance and Sexual Activity   Alcohol use: Yes    Alcohol/week: 14.0 standard drinks    Types: 14 Glasses of wine per week    Comment: occasional; 12/16/18 - states no alcohol since 12/02/18    Drug use: No   Sexual activity: Not on file  Lifestyle   Physical activity    Days per week: 0 days    Minutes per session: 0 min   Stress: Not at  all  Relationships   Social connections    Talks on phone: Patient refused    Gets together: Patient refused    Attends religious service: Patient refused    Active member of club or organization: Patient refused    Attends meetings of clubs or organizations: Patient refused    Relationship status: Patient refused   Intimate partner violence    Fear of current or ex partner: Patient refused    Emotionally abused: Patient refused    Physically abused: Patient refused    Forced sexual activity: Patient refused    Other Topics Concern   Not on file  Social History Narrative   Not on file    Family History  Problem Relation Age of Onset   Arthritis Mother    Cerebrovascular Disease Mother    Heart disease Mother    Vascular Disease Mother    Hypertension Father    COPD Father    Throat cancer Father    Melanoma Father    Prostate cancer Father    Skin cancer Brother    Skin cancer Brother    Breast cancer Paternal Aunt 65     Current Facility-Administered Medications:    0.9 %  sodium chloride infusion, 250 mL, Intravenous, PRN, Seals, Theo Dills, NP, Stopped at 12/18/18 1324   acetaminophen (TYLENOL) tablet 650 mg, 650 mg, Oral, Q6H PRN, 650 mg at 12/20/18 0358 **OR** acetaminophen (TYLENOL) suppository 650 mg, 650 mg, Rectal, Q6H PRN, Seals, Theo Dills, NP   allopurinol (ZYLOPRIM) tablet 300 mg, 300 mg, Oral, Daily, Earlie Server, MD, 300 mg at 12/22/18 0933   feeding supplement (ENSURE ENLIVE) (ENSURE ENLIVE) liquid 237 mL, 237 mL, Oral, BID BM, Gouru, Aruna, MD, 237 mL at 12/22/18 1231   fentaNYL (SUBLIMAZE) 100 MCG/2ML injection, , , ,    fluticasone (FLONASE) 50 MCG/ACT nasal spray 2 spray, 2 spray, Each Nare, Daily, Seals, Angela H, NP, 2 spray at 40/10/27 2536   folic acid (FOLVITE) tablet 1 mg, 1 mg, Oral, Daily, Seals, Angela H, NP, 1 mg at 12/22/18 0933   guaiFENesin-dextromethorphan (ROBITUSSIN DM) 100-10 MG/5ML syrup 5 mL, 5 mL, Oral, Q4H PRN, Gouru, Aruna, MD, 5 mL at 12/18/18 2321   heparin ADULT infusion 100 units/mL (25000 units/240mL sodium chloride 0.45%), 1,700 Units/hr, Intravenous, Continuous, Hall, Scott A, RPH, Stopped at 12/22/18 0930   levalbuterol (XOPENEX) nebulizer solution 0.63 mg, 0.63 mg, Nebulization, Q6H PRN, Gouru, Aruna, MD, 0.63 mg at 12/22/18 0853   loratadine (CLARITIN) tablet 10 mg, 10 mg, Oral, Daily, Seals, Angela H, NP, 10 mg at 12/22/18 6440   MEDLINE mouth rinse, 15 mL, Mouth Rinse, BID, Seals, Angela H, NP, 15 mL at 12/21/18  2149   metoprolol succinate (TOPROL-XL) 24 hr tablet 25 mg, 25 mg, Oral, Daily, Seals, Angela H, NP, 25 mg at 12/22/18 0933   metoprolol tartrate (LOPRESSOR) injection 5 mg, 5 mg, Intravenous, Q6H PRN, Gouru, Aruna, MD, 2.5 mg at 12/22/18 1256   midazolam (VERSED) 2 MG/2ML injection, , , ,    montelukast (SINGULAIR) tablet 10 mg, 10 mg, Oral, Daily, Seals, Angela H, NP, 10 mg at 12/22/18 3474   morphine 2 MG/ML injection 1 mg, 1 mg, Intravenous, Q4H PRN, Mansy, Jan A, MD, 1 mg at 12/21/18 2147   morphine 2 MG/ML injection 2 mg, 2 mg, Intravenous, Q4H PRN, Mansy, Jan A, MD   ondansetron (ZOFRAN) tablet 4 mg, 4 mg, Oral, Q6H PRN **OR** ondansetron (ZOFRAN) injection 4 mg, 4 mg, Intravenous, Q6H  PRN, Seals, Theo Dills, NP   polyethylene glycol (MIRALAX / GLYCOLAX) packet 17 g, 17 g, Oral, Daily PRN, Seals, Theo Dills, NP   potassium chloride (KLOR-CON) packet 20 mEq, 20 mEq, Oral, BID, Seals, Angela H, NP, 20 mEq at 12/22/18 0934   sodium chloride flush (NS) 0.9 % injection 3 mL, 3 mL, Intravenous, Q12H, Seals, Angela H, NP, 3 mL at 12/22/18 0934   sodium chloride flush (NS) 0.9 % injection 3 mL, 3 mL, Intravenous, PRN, Seals, Angela H, NP   tiZANidine (ZANAFLEX) tablet 4 mg, 4 mg, Oral, TID PRN, Fritzi Mandes, MD, 4 mg at 12/21/18 1022  Physical exam:  Vitals:   12/22/18 1200 12/22/18 1233 12/22/18 1302 12/22/18 1306  BP: (!) 168/97 124/65 (!) 127/51 (!) 113/57  Pulse: (!) 116 (!) 119 (!) 113 92  Resp: (!) 22     Temp:      TempSrc:      SpO2: 97%     Weight:      Height:       Physical Exam Constitutional:      General: She is not in acute distress.    Appearance: She is not diaphoretic.  HENT:     Head: Normocephalic and atraumatic.  Eyes:     General: No scleral icterus.    Extraocular Movements: Extraocular movements intact.     Pupils: Pupils are equal, round, and reactive to light.  Neck:     Musculoskeletal: Normal range of motion and neck supple.  Cardiovascular:      Rate and Rhythm: Regular rhythm.     Heart sounds: Normal heart sounds.     Comments: Tachycardia Pulmonary:     Effort: Pulmonary effort is normal. No respiratory distress.     Breath sounds: Decreased breath sounds present. No wheezing or rhonchi.     Comments: Breathing via nasal cannula oxygen.  Right basilar crackles improved. Chest:     Chest wall: No mass or tenderness.  Abdominal:     General: Bowel sounds are normal. There is no distension.     Palpations: Abdomen is soft. There is no splenomegaly or mass.     Tenderness: There is no abdominal tenderness.  Musculoskeletal: Normal range of motion.        General: No deformity.     Right lower leg: No edema.  Skin:    General: Skin is warm and dry.     Findings: No erythema or rash.  Neurological:     General: No focal deficit present.     Mental Status: She is alert and oriented to person, place, and time.     Cranial Nerves: No cranial nerve deficit.     Coordination: Coordination normal.  Psychiatric:        Mood and Affect: Mood normal.        Behavior: Behavior normal.        Thought Content: Thought content normal.     CMP Latest Ref Rng & Units 12/22/2018  Glucose 70 - 99 mg/dL 102(H)  BUN 8 - 23 mg/dL 20  Creatinine 0.44 - 1.00 mg/dL 0.48  Sodium 135 - 145 mmol/L 137  Potassium 3.5 - 5.1 mmol/L 3.7  Chloride 98 - 111 mmol/L 102  CO2 22 - 32 mmol/L 25  Calcium 8.9 - 10.3 mg/dL 9.8  Total Protein 6.5 - 8.1 g/dL -  Total Bilirubin 0.3 - 1.2 mg/dL -  Alkaline Phos 38 - 126 U/L -  AST 15 - 41 U/L -  ALT 0 - 44 U/L -   CBC Latest Ref Rng & Units 12/22/2018  WBC 4.0 - 10.5 K/uL 16.0(H)  Hemoglobin 12.0 - 15.0 g/dL 12.0  Hematocrit 36.0 - 46.0 % 36.6  Platelets 150 - 400 K/uL 205   RADIOGRAPHIC STUDIES: I have personally reviewed the radiological images as listed and agreed with the findings in the report.  Dg Chest 2 View  Result Date: 12/17/2018 CLINICAL DATA:  History of cancer. Former smoker. Shortness  of breath. EXAM: CHEST - 2 VIEW COMPARISON:  03/20/2016.  06/28/2012. FINDINGS: Right hilar fullness. Diffuse bilateral interstitial prominence. Multiple bilateral pulmonary nodular densities. These findings suggest malignancy with possible right hilar adenopathy, interstitial tumor spread, and metastatic disease. An inflammatory or infectious process including active granulomatous disease could also present in this fashion. Contrast-enhanced chest CT should be considered for further evaluation. No pleural effusion or pneumothorax. Heart size normal. Thoracic spine scoliosis. IMPRESSION: Right hilar fullness, diffuse bilateral interstitial prominence, and multiple bilateral pulmonary nodular densities. These findings suggest malignancy with possible right hilar adenopathy, interstitial tumor spread, and metastatic disease. An inflammatory or infectious process including active granulomatous disease could also present this fashion. Contrast-enhanced chest CT should be considered for further evaluation. Electronically Signed   By: Marcello Moores  Register   On: 12/17/2018 08:14   Ct Chest W Contrast  Result Date: 12/17/2018 CLINICAL DATA:  Follow-up exam. History of metastasis and lung nodules. EXAM: CT CHEST, ABDOMEN, AND PELVIS WITH CONTRAST TECHNIQUE: Multidetector CT imaging of the chest, abdomen and pelvis was performed following the standard protocol during bolus administration of intravenous contrast. CONTRAST:  157mL OMNIPAQUE IOHEXOL 300 MG/ML  SOLN COMPARISON:  Chest radiograph 12/16/2018; MRI lumbar spine 12/02/2018 FINDINGS: CT CHEST FINDINGS Cardiovascular: Normal heart size. Trace fluid superior pericardial recess. Thoracic aortic vascular calcifications. Mediastinum/Nodes: No axillary lymphadenopathy. There is a 1.9 cm centrally necrotic precarinal node (image 22; series 2). There is a 1.7 cm right hilar node (image 27; series 2). There is a 1.9 cm left hilar node (image 30; series 2). There is a 1.2 cm  centrally necrotic nodule along the posterior left pericardium (image 38; series 2). Mild wall thickening of the esophagus. Lungs/Pleura: Central airways are patent. There is a dominant centrally necrotic 6.2 x 5.9 cm mass within the right lower lobe (image 68; series 4). There is a 2.9 cm subpleural left lower lobe nodule (image 138; series 4). There are multiple bilateral pulmonary nodules. Reference 1.7 cm left lower lobe nodule (image 115; series 4). Reference 0.8 cm left upper lobe nodule (image 42; series 4). Reference 1.5 cm right middle lobe nodule (image 66; series 4). 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 There is a moderate right pleural effusion. Musculoskeletal: No aggressive or acute appearing osseous lesions. CT ABDOMEN PELVIS FINDINGS Hepatobiliary: There are innumerable low-attenuation lesions throughout the left and right hepatic lobes, the majority are subcentimeter in size. Gallbladder is unremarkable. No intrahepatic or extrahepatic biliary ductal dilatation. Pancreas: Unremarkable Spleen: Unremarkable Adrenals/Urinary Tract: Normal adrenal glands. Kidneys enhance symmetrically with contrast. Subcentimeter too small to characterize low-attenuation lesion inferior pole left kidney (image 77; series 2). Urinary bladder is decompressed. Stomach/Bowel: Stool throughout the colon. No abnormal bowel wall thickening or evidence for bowel obstruction. No free fluid or free intraperitoneal air. Normal morphology of the stomach. Vascular/Lymphatic: Normal caliber abdominal  aorta. Peripheral calcified atherosclerotic plaque. 1.3 cm left periaortic lymph node (image 71; series 2). 1.1 cm aortocaval node (image 80; series 2). There is a 1.0 cm porta hepatic node (image 65; series 2). Reproductive: Prior  hysterectomy. Adnexal structures are unremarkable. Other: None. Musculoskeletal: Lumbar spine degenerative changes. There is a 6.7 x 5.7 cm lytic soft tissue mass involving the right hemi sacrum (image 93; series 2) with soft tissue involving the sacral spinal canal. There is a 2.4 cm soft tissue lytic lesion within the posterior right ilium (image 92; series 2). Additional lytic lesions within the right ilium. IMPRESSION: 1. Large centrally necrotic right lower lobe mass concerning the possibility of primary pulmonary malignancy. 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 2. Multiple enlarged and necrotic mediastinal and hilar nodes most compatible with nodal metastasis. 3. Enlarged retroperitoneal nodes compatible with metastasis. 4. Innumerable low-attenuation lesions within liver compatible metastasis. 5. Large lytic mass involving the right hemi sacrum as well as smaller lytic lesions involving the right ilium consistent with osseous metastasis. Electronically Signed   By: Lovey Newcomer M.D.   On: 12/17/2018 16:47   Ct Angio Chest Pe W Or Wo Contrast  Result Date: 12/20/2018 CLINICAL DATA:  Acute onset of tachycardia and shortness of breath. Recent diagnosis lung cancer EXAM: CT ANGIOGRAPHY CHEST WITH CONTRAST TECHNIQUE: Multidetector CT imaging of the chest was performed using the standard protocol during bolus administration of intravenous contrast. Multiplanar CT image reconstructions and MIPs were obtained to evaluate the vascular anatomy. CONTRAST:  18mL OMNIPAQUE IOHEXOL 350 MG/ML SOLN COMPARISON:  12/17/2018 FINDINGS: Cardiovascular: Normal heart size. Trace pericardial fluid or thickening-stable. Possible hypertrophic left ventricle. Multiple new branching pulmonary artery filling defects that are segmental to subsegmental and demarcated on series 5. The  most proximal filling defects are seen and segmental right upper lobe branches Mediastinum/Nodes: Known low-density adenopathy in the mediastinum better depicted on parenchymal phase CT performed previously. Lungs/Pleura: Known right lower lobe mass with central low-density and pulmonary metastases on both sides. 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 Small right pleural effusion. Upper Abdomen: Extensive hepatic metastatic disease. Musculoskeletal: No acute finding. Please see Z vision log concerning efforts to reach on-call physician. Overhead paging is disallowed. Critical Value/emergent results were called by telephone at the time of interpretation on 12/20/2018 at 6:51 am to Dr. Sidney Ace, who verbally acknowledged these results. Review of the MIP images confirms the above findings. IMPRESSION: 1. Positive for multifocal segmental and subsegmental acute pulmonary emboli.No indication of right heart strain. 2. Extensive thoracic and intra-abdominal malignancy as recently staged. Electronically Signed   By: Monte Fantasia M.D.   On: 12/20/2018 06:51   Mr Jeri Cos PP Contrast  Result Date: 12/21/2018 CLINICAL DATA:  Non-small-cell lung cancer staging EXAM: MRI HEAD WITHOUT AND WITH CONTRAST TECHNIQUE: Multiplanar, multiecho pulse sequences of the brain and surrounding structures were obtained without and with intravenous contrast. CONTRAST:  8 mL Gadovist IV COMPARISON:  None. FINDINGS: Brain: Image quality degraded by motion especially the postcontrast images. Multiple enhancing lesions the brain compatible with metastatic disease. Axial images significantly degraded by motion. Coronal and sagittal images have less motion. 8 mm enhancing lesion right occipital lobe 6 mm enhancing lesion right posterior temporal lobe above the tentorium 9 mm lesion right parietal cortex 3 mm lesion right parietal lobe 5 mm left inferior temporal lobe 5 mm right frontal lesion 5  mm left frontal  lesion. No significant brain edema or midline shift. No acute infarct. Negative for hemorrhage Vascular: Normal arterial flow voids Skull and upper cervical spine: No focal skeletal lesion. 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 All less than 1 cm. No significant edema. Image quality degraded by motion which could limit detection of small lesions. Electronically Signed   By: Franchot Gallo M.D.   On: 12/21/2018 11:59   Mr Lumbar Spine Wo Contrast  Result Date: 12/03/2018 CLINICAL DATA:  Sciatica right side. Right back and leg pain 4-5 months. EXAM: MRI LUMBAR SPINE WITHOUT CONTRAST TECHNIQUE: Multiplanar, multisequence MR imaging of the lumbar spine was performed. No intravenous contrast was administered. COMPARISON:  None. FINDINGS: Segmentation:  Normal Alignment:  4 mm anterolisthesis L4-5.  Mild dextroscoliosis. Vertebrae: 1 cm hyperintense lesion in the left posterior T12 vertebral body best seen on inversion recovery. Infiltrating mass lesion in the right sacrum involving the right sacrum. Tumor involves the right S1 vertebral body and pedicle and extends into the right S2 vertebral body. Tumor is low signal on T1 and hyperintense on T2. Extraosseous tumor with narrowing the foramen at L5-S1 and S1-2. Tumor also infiltrates the posterior iliac bone adjacent to the SI joint. Small right SI joint effusion. Conus medullaris and cauda equina: Conus extends to the L1-2 level. Conus and cauda equina appear normal. Paraspinal and other soft tissues: Negative for retroperitoneal adenopathy. Disc levels: L1-2: Mild disc and facet degeneration without stenosis L2-3: Moderate disc degeneration with disc bulging and spurring. Bilateral facet hypertrophy and mild spinal stenosis. L3-4: Disc degeneration and spondylosis. Bilateral facet hypertrophy. Moderate spinal stenosis. Mild to moderate subarticular stenosis bilaterally L4-5: 4 mm anterolisthesis. Diffuse  bulging of the disc with moderate to severe facet degeneration. Moderate to severe spinal stenosis. Mild subarticular stenosis bilaterally L5-S1: Bilateral facet hypertrophy. Right foraminal encroachment due to tumor extension and spurring. 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 Tumor also infiltrates right posterior iliac bone. 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 Right L5 and S1 foraminal encroachment due to tumor extension into the soft tissues. Small lesion T12 also supportive of metastatic disease. Chondrosarcoma or lymphoma right sacrum less likely. These results were called by telephone at the time of interpretation on 12/03/2018 at 10:27 am to [facility] , who verbally acknowledged these results. Electronically Signed   By: Franchot Gallo M.D.   On: 12/03/2018 10:36   Ct Abdomen Pelvis W Contrast  Result Date: 12/17/2018 CLINICAL DATA:  Follow-up exam. History of metastasis and lung nodules. EXAM: CT CHEST, ABDOMEN, AND PELVIS WITH CONTRAST TECHNIQUE: Multidetector CT imaging of the chest, abdomen and pelvis was performed following the standard protocol during bolus administration of intravenous contrast. CONTRAST:  16mL OMNIPAQUE IOHEXOL 300 MG/ML  SOLN COMPARISON:  Chest radiograph 12/16/2018; MRI lumbar spine 12/02/2018 FINDINGS: CT CHEST FINDINGS Cardiovascular: Normal heart size. Trace fluid superior pericardial recess. Thoracic aortic vascular calcifications. Mediastinum/Nodes: No axillary lymphadenopathy. There is a 1.9 cm centrally necrotic precarinal node (image 22; series 2). There is a 1.7 cm right hilar node (image 27; series 2). There is a 1.9 cm left hilar node (image 30; series 2). There is a 1.2 cm centrally necrotic nodule along the posterior left pericardium (image 38; series 2). Mild wall thickening of the esophagus. Lungs/Pleura: Central airways are patent. There is a dominant centrally  necrotic 6.2 x 5.9 cm mass within the right lower lobe (  image 68; series 4). There is a 2.9 cm subpleural left lower lobe nodule (image 138; series 4). There are multiple bilateral pulmonary nodules. Reference 1.7 cm left lower lobe nodule (image 115; series 4). Reference 0.8 cm left upper lobe nodule (image 42; series 4). Reference 1.5 cm right middle lobe nodule (image 66; series 4). 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 There is a moderate right pleural effusion. Musculoskeletal: No aggressive or acute appearing osseous lesions. CT ABDOMEN PELVIS FINDINGS Hepatobiliary: There are innumerable low-attenuation lesions throughout the left and right hepatic lobes, the majority are subcentimeter in size. Gallbladder is unremarkable. No intrahepatic or extrahepatic biliary ductal dilatation. Pancreas: Unremarkable Spleen: Unremarkable Adrenals/Urinary Tract: Normal adrenal glands. Kidneys enhance symmetrically with contrast. Subcentimeter too small to characterize low-attenuation lesion inferior pole left kidney (image 77; series 2). Urinary bladder is decompressed. Stomach/Bowel: Stool throughout the colon. No abnormal bowel wall thickening or evidence for bowel obstruction. No free fluid or free intraperitoneal air. Normal morphology of the stomach. Vascular/Lymphatic: Normal caliber abdominal aorta. Peripheral calcified atherosclerotic plaque. 1.3 cm left periaortic lymph node (image 71; series 2). 1.1 cm aortocaval node (image 80; series 2). There is a 1.0 cm porta hepatic node (image 65; series 2). Reproductive: Prior hysterectomy. Adnexal structures are unremarkable. Other: None. Musculoskeletal: Lumbar spine degenerative changes. There is a 6.7 x 5.7 cm lytic soft tissue mass involving the right hemi sacrum (image  93; series 2) with soft tissue involving the sacral spinal canal. There is a 2.4 cm soft tissue lytic lesion within the posterior right ilium (image 92; series 2). Additional lytic lesions within the right ilium. IMPRESSION: 1. Large centrally necrotic right lower lobe mass concerning the possibility of primary pulmonary malignancy. 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 2. Multiple enlarged and necrotic mediastinal and hilar nodes most compatible with nodal metastasis. 3. Enlarged retroperitoneal nodes compatible with metastasis. 4. Innumerable low-attenuation lesions within liver compatible metastasis. 5. Large lytic mass involving the right hemi sacrum as well as smaller lytic lesions involving the right ilium consistent with osseous metastasis. Electronically Signed   By: Lovey Newcomer M.D.   On: 12/17/2018 16:47   US Venous Img Lower Bilateral  Result Date: 12/20/2018 CLINICAL DATA:  Metastatic cancer, pulmonary emboli, anticoagulated EXAM: BILATERAL LOWER EXTREMITY VENOUS DOPPLER ULTRASOUND TECHNIQUE: Gray-scale sonography with graded compression, as well as color Doppler and duplex ultrasound were performed to evaluate the lower extremity deep venous systems from the level of the common femoral vein and including the common femoral, femoral, profunda femoral, popliteal and calf veins including the posterior tibial, peroneal and gastrocnemius veins when visible. The superficial great saphenous vein was also interrogated. Spectral Doppler was utilized to evaluate flow at rest and with distal augmentation maneuvers in the common femoral, femoral and popliteal veins. COMPARISON:  None. FINDINGS: RIGHT LOWER EXTREMITY Common Femoral Vein: No evidence of thrombus. Normal compressibility, respiratory phasicity and response to augmentation. Saphenofemoral Junction:  No evidence of thrombus. Normal compressibility and flow on color Doppler imaging. Profunda Femoral Vein: No evidence of thrombus. Normal compressibility and flow on color Doppler imaging. Femoral Vein: No evidence of thrombus. Normal compressibility, respiratory phasicity and response to augmentation. Popliteal Vein: No evidence of thrombus. Normal compressibility, respiratory  phasicity and response to augmentation. Calf Veins: No evidence of thrombus. Normal compressibility and flow on color Doppler imaging. Superficial Great Saphenous Vein: No evidence of thrombus. Normal compressibility. Venous Reflux:  Not assessed Other Findings:  None. LEFT LOWER EXTREMITY Common Femoral Vein: No evidence of thrombus. Normal compressibility, respiratory phasicity and response to augmentation. Saphenofemoral Junction: No evidence of thrombus. Normal compressibility and flow on color Doppler imaging. Profunda Femoral Vein: No evidence of thrombus. Normal compressibility and flow on color Doppler imaging. Femoral Vein: No evidence of thrombus. Normal compressibility, respiratory phasicity and response to augmentation. Popliteal Vein: No evidence of thrombus. Normal compressibility, respiratory phasicity and response to augmentation. Calf Veins: Left posterior tibial calf veins demonstrate intraluminal hypoechoic thrombus over a short segment. Vessel is noncompressible. Very low thrombus burden. Other calf veins appear patent. Superficial Great Saphenous Vein: No evidence of thrombus. Normal compressibility. Left small saphenous vein also demonstrates hypoechoic thrombus. Vessel is noncompressible. Very low thrombus burden. Findings compatible with left small saphenous vein superficial thrombosis. Venous Reflux:  Not assessed Other Findings:  None. IMPRESSION: Negative for right lower extremity DVT. Positive for left calf posterior tibial DVT. Very low thrombus burden. No propagation into the popliteal or femoral veins.  Associated left small saphenous vein superficial thrombosis/thrombophlebitis. Electronically Signed   By: Jerilynn Mages.  Shick M.D.   On: 12/20/2018 15:49   Ct Biopsy  Result Date: 12/22/2018 INDICATION: 73 year old with probable metastatic disease and needs a tissue diagnosis. Lytic lesion involving the posterior right ilium. EXAM: CT-GUIDED CORE BIOPSY OF RIGHT ILIAC BONE LESION MEDICATIONS: None. ANESTHESIA/SEDATION: Moderate (conscious) sedation was employed during this procedure. A total of Versed 1.0 mg and Fentanyl 50 mcg was administered intravenously. Moderate Sedation Time: 11 minutes. The patient's level of consciousness and vital signs were monitored continuously by radiology nursing throughout the procedure under my direct supervision. FLUOROSCOPY TIME:  None COMPLICATIONS: None immediate. PROCEDURE: Informed written consent was obtained from the patient after a thorough discussion of the procedural risks, benefits and alternatives. All questions were addressed. A timeout was performed prior to the initiation of the procedure. Patient was placed prone. CT images through the pelvis were obtained. The destructive lesion in the posterior right ilium was targeted for biopsy. The overlying skin was prepped with chlorhexidine and sterile field was created. Skin and soft tissues were anesthetized with 1% lidocaine. Using CT guidance, 17 gauge coaxial needle was easily directed through the cortex and into the soft tissue lesion within the bone. A total of 4 core biopsies were obtained with an 18 gauge device. Specimens placed in formalin. Needle was removed without complication. FINDINGS: Soft tissue lesions involving the right sacrum and the posterior right ilium. Needle was easily advanced into the right posterior iliac bone lesion. Four soft tissue core biopsies were obtained. IMPRESSION: CT-guided core biopsies of the posterior right iliac bone lesion. Electronically Signed   By: Markus Daft M.D.   On: 12/22/2018  11:55     Assessment and plan #Acute bilateral multifocal PE, agree with anticoagulation with heparin  CT image finding was independent reviewed by me and discussed with patient. 12/20/2018 lower extremity Doppler showed.  Left calf posterior tibial DVT, low thrombus burden. Heparin was temporarily hold for an hour prior to right ilium biopsy.  Resume heparin drip.   #Likely metastatic lung cancer with extensive lymphadenopathy, liver and bone involvement. Aggressive malignant process. Hold liver biopsy due to acute PE and the need of immediate anticoagulation. 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 No edema.  She will need palliative radiation outpatient.  Currently asymptomatic. Right ilium bone biopsy was done this morning.   #Acute hypoxic respiratory failure, multifactorial secondary to lung mass, lymphangitic spread, pleural effusion, PE.  Continue supportive care.  Oxygen.  Wean down as tolerated. #Leukocytosis.  Likely reactive. CAP.  Antibiotics ceftriaxone and azithromycin for coverage of pneumonia  #Hypercalcemia, secondary to bone metastasis and malignancy.  Status post Zometa 4 mg x 1.  Calcium level normalized to 9.8 today.   #Uric acid level as trended down after respite case.  Continue allopurinol  #Lower back/hip pain.  Exacerbated with motion.  Continue morphine as needed.  #Transaminitis, due to malignancy.  .  #Goal of care, we discussed briefly that her condition is most likely incurable.  Awaiting tissue diagnosis.  Patient desires chemotherapy treatments after diagnosis is finalized. CODE STATUS was discussed.  Full code Patient's daughter has been updated daily previously.  Today patient wants me to update her husband. Recommend patient to have a discussion with both daughter and husband and appoint 1 person to be the main contact and I will call to give update. Addendum, I  received a secure chart message from patient's RN.  Patient has appointed husband to be the main contact person and request me to give him an update.  Called husband and updated him.    Earlie Server, MD, PhD Hematology Oncology [facility] at [facility] Pager- [phone] 12/22/2018

## 2018-12-22 NOTE — Progress Notes (Signed)
[place] for heparin Indication: pulmonary embolus  Allergies  Allergen Reactions  . Furadantin  [Nitrofurantoin]   . Phenobarbital Rash   Patient Measurements: Height: 5\' 6"  (167.6 cm) Weight: 190 lb 3.2 oz (86.3 kg) IBW/kg (Calculated) : 59.3 Heparin Dosing Weight:77.8 kg  Vital Signs: Temp: 98.9 F (37.2 C) (08/05 2057) Temp Source: Oral (08/05 2057) BP: 115/82 (08/05 2057) Pulse Rate: 109 (08/05 2057)  Labs: Recent Labs    12/20/18 0435 12/20/18 0809  12/21/18 0132  12/21/18 1733 12/22/18 0403 12/22/18 2124  HGB 12.8  --   --  12.4  --   --  12.0  --   HCT 38.9  --   --  37.4  --   --  36.6  --   PLT 189  --   --  191  --   --  205  --   APTT  --  33  --   --   --   --   --   --   LABPROT  --  13.6  --   --   --   --   --   --   INR  --  1.1  --   --   --   --   --   --   HEPARINUNFRC  --   --    < > 0.31   < > 0.48 0.21* 0.24*  CREATININE 0.43*  --   --  0.48  --   --  0.48  --    < > = values in this interval not displayed.    Estimated Creatinine Clearance: 68.3 mL/min (by C-G formula based on SCr of 0.48 mg/dL).   Medical History: Past Medical History:  Diagnosis Date  . Allergy   . Cancer ([place]) 09/2014   Skin Cancer  . Hyperlipidemia   . Hypertension     Medications:  Scheduled:  . allopurinol  300 mg Oral Daily  . feeding supplement (ENSURE ENLIVE)  237 mL Oral BID BM  . fentaNYL      . fluticasone  2 spray Each Nare Daily  . folic acid  1 mg Oral Daily  . loratadine  10 mg Oral Daily  . mouth rinse  15 mL Mouth Rinse BID  . metoprolol succinate  25 mg Oral Daily  . midazolam      . montelukast  10 mg Oral Daily  . potassium chloride  20 mEq Oral BID  . sodium chloride flush  3 mL Intravenous Q12H    Assessment: Patient admitted for SOB s/t to lung mets that has possibly metastasized to liver. Patient has PE on CT and is going for liver biopsy this am at around 0930. Patient is to be started on  heparin drip @ 0945 post-biopsy.  Heparin Course: 8/3 Heparin initiated at 1200 units/hr 8/3 @6 :10 HL 0.17, Heparin 2300 units bolus, increase rate to 1500 units/hr 8/4 @ 01:32 HL 0.31, Heparin drip increased to 1550 units/hr 8/4 @ 08:57 HL 0.45 8/4 @ 1733 HL 0.48  8/5 @ 0403 HL 0.21 8/5: Heparin was held today at 0930 then restarted at 1320 at 1700 units/hr.  Confirmed with nurse there have been on interruptions in infusion since restart.  HL low at 0.24 (at 2124)  Goal of Therapy:  Heparin level 0.3-0.7 units/ml Monitor platelets by anticoagulation protocol: Yes   Plan:  Will rebolus with 1200 units x 1 and increase infusion to 1850 units/hr  Recheck HL in 6 hours, continue to monitor daily HL and CBC's and adjust per anti-Xa levels.  Hart Robinsons, PharmD Clinical Pharmacist 12/22/2018

## 2018-12-22 NOTE — Progress Notes (Signed)
[place] [facility] [place] NAME: Elizabeth [name]    MR#:  [id number]  DATE OF BIRTH:  [birth year]  SUBJECTIVE:  Patient status post biopsy of right iliac bone today, patient now down to 5 L of oxygen and saturation 96%, says she is feeling better.  Tachycardic after the biopsy.  Patient has no chest pain.  And appears comfortable.  Says shortness of breath is improved.   REVIEW OF SYSTEMS:  CONSTITUTIONAL: No fever, fatigue or weakness.  EYES: No blurred or double vision.  EARS, NOSE, AND THROAT: No tinnitus or ear pain.  RESPIRATORY: Mild shortness of breath, cough.  CARDIOVASCULAR: No chest pain, orthopnea, edema.  GASTROINTESTINAL: No nausea, vomiting, diarrhea or abdominal pain.  GENITOURINARY: No dysuria, hematuria.  ENDOCRINE: No polyuria, nocturia HEMATOLOGY: No anemia, easy bruising or bleeding SKIN: No rash or lesion. MUSCULOSKELETAL: No joint pain or arthritis.   NEUROLOGIC: No tingling, numbness, weakness.  PSYCHIATRY: No anxiety or depression.   DRUG ALLERGIES:   Allergies  Allergen Reactions  . Furadantin  [Nitrofurantoin]   . Phenobarbital Rash    VITALS:  Blood pressure (!) 113/57, pulse 92, temperature 99.3 F (37.4 C), resp. rate (!) 22, height _0  (1.676 m), weight 86.3 kg, SpO2 97 %.  PHYSICAL EXAMINATION:  GENERAL:  73 y.o.-year-old patient lying in the bed with no acute distress.   EYES: Pupils equal, round, reactive to light and accommodation. No scleral icterus. Extraocular muscles intact.  HEENT: Head atraumatic, normocephalic. Oropharynx and nasopharynx clear.  NECK:  Supple, no jugular venous distention. No thyroid enlargement, no tenderness.  LUNGS: + Diminished breath sounds in the lung bases bilaterally, no wheezing, rales,rhonchi or crepitation. No use of accessory muscles of respiration.  CARDIOVASCULAR: Tachycardic, regular rhythm, S1, S2 normal. No murmurs, rubs, or gallops.  ABDOMEN: Soft, nontender,  nondistended. Bowel sounds present.  EXTREMITIES: No pedal edema, cyanosis, or clubbing.  NEUROLOGIC: Cranial nerves II through XII are intact. Muscle strength 5/5 in all extremities. Sensation intact. Gait not checked.  PSYCHIATRIC: The patient is alert and oriented x 3.  SKIN: No obvious rash, lesion, or ulcer.    LABORATORY PANEL:   CBC Recent Labs  Lab 12/22/18 0403  WBC 16.0*  HGB 12.0  HCT 36.6  PLT 205   ------------------------------------------------------------------------------------------------------------------  Chemistries  Recent Labs  Lab 12/17/18 2035  12/22/18 0403  NA 137   < > 137  K 2.7*   < > 3.7  CL 102   < > 102  CO2 24   < > 25  GLUCOSE 134*   < > 102*  BUN 26*   < > 20  CREATININE 0.83   < > 0.48  CALCIUM 12.2*   < > 9.8  AST 74*  --   --   ALT 95*  --   --   ALKPHOS 153*  --   --   BILITOT 0.8  --   --    < > = values in this interval not displayed.   ------------------------------------------------------------------------------------------------------------------  Cardiac Enzymes No results for input(s): TROPONINI in the last 168 hours. ------------------------------------------------------------------------------------------------------------------  RADIOLOGY:  Mr Jeri Cos Wo Contrast  Result Date: 12/21/2018 CLINICAL DATA:  Non-small-cell lung cancer staging EXAM: MRI HEAD WITHOUT AND WITH CONTRAST TECHNIQUE: Multiplanar, multiecho pulse sequences of the brain and surrounding structures were obtained without and with intravenous contrast. CONTRAST:  8 mL Gadovist IV COMPARISON:  None. FINDINGS: Brain: Image quality degraded by motion especially the postcontrast  images. Multiple enhancing lesions the brain compatible with metastatic disease. Axial images significantly degraded by motion. Coronal and sagittal images have less motion. 8 mm enhancing lesion right occipital lobe 6 mm enhancing lesion right posterior temporal lobe above the  tentorium 9 mm lesion right parietal cortex 3 mm lesion right parietal lobe 5 mm left inferior temporal lobe 5 mm right frontal lesion 5 mm left frontal lesion. No significant brain edema or midline shift. No acute infarct. Negative for hemorrhage Vascular: Normal arterial flow voids Skull and upper cervical spine: No focal skeletal lesion. 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 All less than 1 cm. No significant edema. Image quality degraded by motion which could limit detection of small lesions. Electronically Signed   By: Franchot Gallo M.D.   On: 12/21/2018 11:59   US Venous Img Lower Bilateral  Result Date: 12/20/2018 CLINICAL DATA:  Metastatic cancer, pulmonary emboli, anticoagulated EXAM: BILATERAL LOWER EXTREMITY VENOUS DOPPLER ULTRASOUND TECHNIQUE: Gray-scale sonography with graded compression, as well as color Doppler and duplex ultrasound were performed to evaluate the lower extremity deep venous systems from the level of the common femoral vein and including the common femoral, femoral, profunda femoral, popliteal and calf veins including the posterior tibial, peroneal and gastrocnemius veins when visible. The superficial great saphenous vein was also interrogated. Spectral Doppler was utilized to evaluate flow at rest and with distal augmentation maneuvers in the common femoral, femoral and popliteal veins. COMPARISON:  None. FINDINGS: RIGHT LOWER EXTREMITY Common Femoral Vein: No evidence of thrombus. Normal compressibility, respiratory phasicity and response to augmentation. Saphenofemoral Junction: No evidence of thrombus. Normal compressibility and flow on color Doppler imaging. Profunda Femoral Vein: No evidence of thrombus. Normal compressibility and flow on color Doppler imaging. Femoral Vein: No evidence of thrombus. Normal compressibility, respiratory phasicity and response to augmentation. Popliteal Vein: No evidence of thrombus. Normal  compressibility, respiratory phasicity and response to augmentation. Calf Veins: No evidence of thrombus. Normal compressibility and flow on color Doppler imaging. Superficial Great Saphenous Vein: No evidence of thrombus. Normal compressibility. Venous Reflux:  Not assessed Other Findings:  None. LEFT LOWER EXTREMITY Common Femoral Vein: No evidence of thrombus. Normal compressibility, respiratory phasicity and response to augmentation. Saphenofemoral Junction: No evidence of thrombus. Normal compressibility and flow on color Doppler imaging. Profunda Femoral Vein: No evidence of thrombus. Normal compressibility and flow on color Doppler imaging. Femoral Vein: No evidence of thrombus. Normal compressibility, respiratory phasicity and response to augmentation. Popliteal Vein: No evidence of thrombus. Normal compressibility, respiratory phasicity and response to augmentation. Calf Veins: Left posterior tibial calf veins demonstrate intraluminal hypoechoic thrombus over a short segment. Vessel is noncompressible. Very low thrombus burden. Other calf veins appear patent. Superficial Great Saphenous Vein: No evidence of thrombus. Normal compressibility. Left small saphenous vein also demonstrates hypoechoic thrombus. Vessel is noncompressible. Very low thrombus burden. Findings compatible with left small saphenous vein superficial thrombosis. Venous Reflux:  Not assessed Other Findings:  None. IMPRESSION: Negative for right lower extremity DVT. Positive for left calf posterior tibial DVT. Very low thrombus burden. No propagation into the popliteal or femoral veins. Associated left small saphenous vein superficial thrombosis/thrombophlebitis. Electronically Signed   By: Jerilynn Mages.  Shick M.D.   On: 12/20/2018 15:49   Ct Biopsy  Result Date: 12/22/2018 INDICATION: 73 year old with probable metastatic disease and needs a tissue diagnosis. Lytic lesion involving the posterior right ilium. EXAM: CT-GUIDED CORE BIOPSY OF RIGHT  ILIAC BONE LESION MEDICATIONS: None. ANESTHESIA/SEDATION: Moderate (conscious)  sedation was employed during this procedure. A total of Versed 1.0 mg and Fentanyl 50 mcg was administered intravenously. Moderate Sedation Time: 11 minutes. The patient's level of consciousness and vital signs were monitored continuously by radiology nursing throughout the procedure under my direct supervision. FLUOROSCOPY TIME:  None COMPLICATIONS: None immediate. PROCEDURE: Informed written consent was obtained from the patient after a thorough discussion of the procedural risks, benefits and alternatives. All questions were addressed. A timeout was performed prior to the initiation of the procedure. Patient was placed prone. CT images through the pelvis were obtained. The destructive lesion in the posterior right ilium was targeted for biopsy. The overlying skin was prepped with chlorhexidine and sterile field was created. Skin and soft tissues were anesthetized with 1% lidocaine. Using CT guidance, 17 gauge coaxial needle was easily directed through the cortex and into the soft tissue lesion within the bone. A total of 4 core biopsies were obtained with an 18 gauge device. Specimens placed in formalin. Needle was removed without complication. FINDINGS: Soft tissue lesions involving the right sacrum and the posterior right ilium. Needle was easily advanced into the right posterior iliac bone lesion. Four soft tissue core biopsies were obtained. IMPRESSION: CT-guided core biopsies of the posterior right iliac bone lesion. Electronically Signed   By: Markus Daft M.D.   On: 12/22/2018 11:55    EKG:   Orders placed or performed during the hospital encounter of 12/17/18  . EKG 12-Lead  . EKG 12-Lead    ASSESSMENT AND PLAN:    Acute hypoxic respiratory failure- secondary to right lower lobe lung mass and multifocal PE.  Improving.  Patient is down to 4 to 5 L of oxygen. -CTA chest was positive for multifocal PE -Continue  heparin gtt -ECHO without evidence of right heart strain -DuoNebs PRN -Continue supplemental oxygen, wean as able  Left lower extremity DVT- seen on duplex US. -Continue heparin gtt, plan to wean off the oxygen and then changed to oral anticoagulant if okay with oncology.  New right lower lobe 6 cm lung mass with central necrosis- concerning for primary pulmonary malignancy with metastases to lymph nodes, liver, bone. -Oncology following Status post bone marrow biopsy today   acute PE, possible superimposed pneumonia with hypoxia, elevated white count, fever, elevated procalcitonin, start empiric antibiotics secondary to immunosuppressed status.-Rocephin, Zithromax..   Hypercalcemia- improving, likely secondary to malignancy. -s/p IV Zometa x 1  Hypertension-tachycardia, increase beta-blocker due to tachycardia   Plan for discharge when able to wean off oxygen, patient is eager to go home as soon as possible and start therapy for her cancer.  all the records are reviewed and case discussed with Care Management/Social Workerr. Management plans discussed with the patient,she is  in agreement.  Patient said that she would update to the family members and requesting not to talk to her family members at this point of time  CODE STATUS: Full code  TOTAL TIME TAKING CARE OF THIS PATIENT: 40 minutes.   POSSIBLE D/C IN 3-4 DAYS, DEPENDING ON CLINICAL CONDITION.  Note: This dictation was prepared with Dragon dictation along with smaller phrase technology. Any transcriptional errors that result from this process are unintentional.   Epifanio Lesches M.D on 12/22/2018 at 2:28 PM  Between 7am to 6pm - Pager - [phone]  After 6pm go to [email or web address] - password [email or web address]  Office  [phone]  CC: Primary care physician; Mar Daring, PA-C

## 2018-12-22 NOTE — Consult Note (Signed)
Chief Complaint: Patient was seen in consultation today for tissue biopsy. Chief Complaint  Patient presents with   Shortness of Breath   Hip Pain   Fatigue     Referring Physician(s): Yu  Patient Status: [facility] - In-pt  History of Present Illness: Elizabeth [name] is a 73 y.o. female with presumed metastatic disease.  Patient has been complaining of shortness of breath and right hip pain.  Cross-sectional imaging has demonstrated pelvic bone lesions and patient was found to have a right lung mass with chest lymphadenopathy and liver lesions.  In addition, the patient has a calf DVT and small pulmonary emboli.  Patient is being treated with IV heparin for the thromboembolic disease.  Shortness of breath has not improved while in the hospital.  She denies chest or abdominal pain.  She notes loose stool since being in the hospital.  Past Medical History:  Diagnosis Date   Allergy    Cancer ([place]) 09/2014   Skin Cancer   Hyperlipidemia    Hypertension     Past Surgical History:  Procedure Laterality Date   ABDOMINAL HYSTERECTOMY  1989   due to menorrhagia   COLONOSCOPY     DILATION AND CURETTAGE OF UTERUS  1977   SKIN CANCER EXCISION  10/2014   on chest; Dr. Evorn Gong   TRIGGER FINGER RELEASE Right 2000   WRIST ARTHROPLASTY Left 1991    Allergies: Furadantin  [nitrofurantoin] and Phenobarbital  Medications: Prior to Admission medications   Medication Sig Start Date End Date Taking? Authorizing Provider  albuterol (VENTOLIN HFA) 108 (90 Base) MCG/ACT inhaler Inhale 2 puffs into the lungs every 6 (six) hours as needed for wheezing or shortness of breath. 12/09/18  Yes Burnette, Clearnce Sorrel, PA-C  benzonatate (TESSALON) 200 MG capsule Take 1 capsule (200 mg total) by mouth 3 (three) times daily as needed. 12/09/18  Yes Mar Daring, PA-C  cetirizine (ZYRTEC) 10 MG tablet TAKE 1 TABLET BY MOUTH ONCE A DAY 12/04/17  Yes Mar Daring, PA-C  cholecalciferol  (VITAMIN D) 1000 units tablet Take 1,000 Units by mouth daily.   Yes [provider]  CINNAMON PO Take by mouth daily.    Yes [provider]  fluticasone (FLONASE) 50 MCG/ACT nasal spray PLACE 2 SPRAYS INTO BOTH NOSTRILS DAILY 09/29/18  Yes Fenton Malling M, PA-C  Fluticasone-Salmeterol (ADVAIR DISKUS) 250-50 MCG/DOSE AEPB INHALE 1 PUFF INTO LUNGS EVERY 12 HOURS (TWICE DAILY) RINSE MOUTH AFTER USE. Patient taking differently: as needed. INHALE 1 PUFF INTO LUNGS EVERY 12 HOURS (TWICE DAILY) RINSE MOUTH AFTER USE 06/11/18  Yes Fenton Malling M, PA-C  folic acid (FOLVITE) 1 MG tablet Take 1 mg by mouth daily.    Yes [provider]  gabapentin (NEURONTIN) 300 MG capsule Take 1 capsule (300 mg total) by mouth 3 (three) times daily. 12/16/18  Yes Earlie Server, MD  Ginger, Zingiber officinalis, (GINGER PO) Take by mouth.   Yes [provider]  hydrochlorothiazide (MICROZIDE) 12.5 MG capsule TAKE 1 CAPSULE BY MOUTH ONCE DAILY 11/30/18  Yes Mar Daring, PA-C  HYDROcodone-acetaminophen (NORCO) 5-325 MG tablet Take 1 tablet by mouth every 6 (six) hours as needed for moderate pain. 12/16/18  Yes Earlie Server, MD  losartan (COZAAR) 100 MG tablet TAKE 1 TABLET BY MOUTH ONCE A DAY 08/05/18  Yes Mar Daring, PA-C  methotrexate 2.5 MG tablet Take 7.5 mg by mouth once a week.    Yes [provider]  metoprolol succinate (TOPROL-XL) 50  MG 24 hr tablet TAKE 1 TABLET BY MOUTH TWICE A DAY WITH OR IMMEDIATELY FOLLOWING A MEAL 08/23/18  Yes Burnette, Clearnce Sorrel, PA-C  Misc Natural Products (HERBAL ENERGY COMPLEX PO) Take by mouth daily.    Yes [provider]  montelukast (SINGULAIR) 10 MG tablet TAKE 1 TABLET BY MOUTH ONCE DAILY 09/24/18  Yes Mar Daring, PA-C  Multiple Vitamin tablet Take 1 tablet by mouth daily.  02/27/11  Yes [provider]  Multiple Vitamins-Minerals (PRESERVISION AREDS PO) Take by mouth daily.    Yes [provider]  Multiple Vitamins-Minerals (ZINC PO) Take by mouth daily.    Yes [provider]  Probiotic Product (PROBIOTIC-10 PO) Take by mouth daily.   Yes [provider]  tiZANidine (ZANAFLEX) 4 MG tablet TAKE 1 TABLET BY MOUTH 3 TIMES DAILY AS NEEDED FOR MUSCLE SPASMS 11/11/18  Yes Fenton Malling M, PA-C  traMADol (ULTRAM) 50 MG tablet Take 50 mg by mouth every 8 (eight) hours as needed.   Yes [provider]  TURMERIC PO Take by mouth daily.    Yes [provider]  vitamin E 1000 UNIT capsule Take 1,000 Units by mouth daily.   Yes [provider]     Family History  Problem Relation Age of Onset   Arthritis Mother    Cerebrovascular Disease Mother    Heart disease Mother    Vascular Disease Mother    Hypertension Father    COPD Father    Throat cancer Father    Melanoma Father    Prostate cancer Father    Skin cancer Brother    Skin cancer Brother    Breast cancer Paternal Aunt 50    Social History   Socioeconomic History   Marital status: Married    Spouse name: Eulas Post   Number of children: 2   Years of education: Not on file   Highest education level: Bachelor's degree (e.g., BA, AB, BS)  Occupational History   Occupation: retired  Scientist, product/process development strain: Not hard at [employer]    Worry: Never true    Inability: Never true   Transportation needs    Medical: No    Non-medical: No  Tobacco Use   Smoking status: Former Smoker    Packs/day: 0.50    Years: 35.00    Pack years: 17.50    Quit date: 05/20/2003    Years since quitting: 15.6   Smokeless tobacco: Never Used  Substance and Sexual Activity   Alcohol use: Yes    Alcohol/week: 14.0 standard drinks    Types: 14 Glasses of wine per week    Comment: occasional; 12/16/18 - states no alcohol since 12/02/18    Drug use: No   Sexual activity: Not on file  Lifestyle   Physical activity    Days per week: 0  days    Minutes per session: 0 min   Stress: Not at all  Relationships   Social connections    Talks on phone: Patient refused    Gets together: Patient refused    Attends religious service: Patient refused    Active member of club or organization: Patient refused    Attends meetings of clubs or organizations: Patient refused    Relationship status: Patient refused  Other Topics Concern   Not on file  Social History Narrative   Not on file    Review of Systems  Respiratory: Positive for shortness of breath.  Cardiovascular: Negative for chest pain.  Gastrointestinal: Negative for abdominal distention.  Genitourinary: Negative.     Vital Signs: BP 135/79 (BP Location: Right Arm)    Pulse 100    Temp 99.3 F (37.4 C)    Resp 20    Ht 5\' 6"  (1.676 m)    Wt 86.3 kg    SpO2 94%    BMI 30.70 kg/m   Physical Exam Cardiovascular:     Rate and Rhythm: Normal rate and regular rhythm.     Heart sounds: Normal heart sounds.  Pulmonary:     Effort: Pulmonary effort is normal.     Breath sounds: Wheezing present.     Comments: Decreased breath sounds in the right lung with wheezing. Abdominal:     General: Abdomen is flat.     Palpations: Abdomen is soft.  Neurological:     Mental Status: She is alert.     Imaging: Dg Chest 2 View  Result Date: 12/17/2018 CLINICAL DATA:  History of cancer. Former smoker. Shortness of breath. EXAM: CHEST - 2 VIEW COMPARISON:  03/20/2016.  06/28/2012. FINDINGS: Right hilar fullness. Diffuse bilateral interstitial prominence. Multiple bilateral pulmonary nodular densities. These findings suggest malignancy with possible right hilar adenopathy, interstitial tumor spread, and metastatic disease. An inflammatory or infectious process including active granulomatous disease could also present in this fashion. Contrast-enhanced chest CT should be considered for further evaluation. No pleural effusion or pneumothorax. Heart size normal. Thoracic spine  scoliosis. IMPRESSION: Right hilar fullness, diffuse bilateral interstitial prominence, and multiple bilateral pulmonary nodular densities. These findings suggest malignancy with possible right hilar adenopathy, interstitial tumor spread, and metastatic disease. An inflammatory or infectious process including active granulomatous disease could also present this fashion. Contrast-enhanced chest CT should be considered for further evaluation. Electronically Signed   By: Marcello Moores  Register   On: 12/17/2018 08:14   Ct Chest W Contrast  Result Date: 12/17/2018 CLINICAL DATA:  Follow-up exam. History of metastasis and lung nodules. EXAM: CT CHEST, ABDOMEN, AND PELVIS WITH CONTRAST TECHNIQUE: Multidetector CT imaging of the chest, abdomen and pelvis was performed following the standard protocol during bolus administration of intravenous contrast. CONTRAST:  165mL OMNIPAQUE IOHEXOL 300 MG/ML  SOLN COMPARISON:  Chest radiograph 12/16/2018; MRI lumbar spine 12/02/2018 FINDINGS: CT CHEST FINDINGS Cardiovascular: Normal heart size. Trace fluid superior pericardial recess. Thoracic aortic vascular calcifications. Mediastinum/Nodes: No axillary lymphadenopathy. There is a 1.9 cm centrally necrotic precarinal node (image 22; series 2). There is a 1.7 cm right hilar node (image 27; series 2). There is a 1.9 cm left hilar node (image 30; series 2). There is a 1.2 cm centrally necrotic nodule along the posterior left pericardium (image 38; series 2). Mild wall thickening of the esophagus. Lungs/Pleura: Central airways are patent. There is a dominant centrally necrotic 6.2 x 5.9 cm mass within the right lower lobe (image 68; series 4). There is a 2.9 cm subpleural left lower lobe nodule (image 138; series 4). There are multiple bilateral pulmonary nodules. Reference 1.7 cm left lower lobe nodule (image 115; series 4). Reference 0.8 cm left upper lobe nodule (image 42; series 4). Reference 1.5 cm right middle lobe nodule (image 66;  series 4). 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 There is a moderate right pleural effusion. Musculoskeletal: No aggressive or acute appearing osseous lesions. CT  ABDOMEN PELVIS FINDINGS Hepatobiliary: There are innumerable low-attenuation lesions throughout the left and right hepatic lobes, the majority are subcentimeter in size. Gallbladder is unremarkable. No intrahepatic or extrahepatic biliary ductal dilatation. Pancreas: Unremarkable Spleen: Unremarkable Adrenals/Urinary Tract: Normal adrenal glands. Kidneys enhance symmetrically with contrast. Subcentimeter too small to characterize low-attenuation lesion inferior pole left kidney (image 77; series 2). Urinary bladder is decompressed. Stomach/Bowel: Stool throughout the colon. No abnormal bowel wall thickening or evidence for bowel obstruction. No free fluid or free intraperitoneal air. Normal morphology of the stomach. Vascular/Lymphatic: Normal caliber abdominal aorta. Peripheral calcified atherosclerotic plaque. 1.3 cm left periaortic lymph node (image 71; series 2). 1.1 cm aortocaval node (image 80; series 2). There is a 1.0 cm porta hepatic node (image 65; series 2). Reproductive: Prior hysterectomy. Adnexal structures are unremarkable. Other: None. Musculoskeletal: Lumbar spine degenerative changes. There is a 6.7 x 5.7 cm lytic soft tissue mass involving the right hemi sacrum (image 93; series 2) with soft tissue involving the sacral spinal canal. There is a 2.4 cm soft tissue lytic lesion within the posterior right ilium (image 92; series 2). Additional lytic lesions within the right ilium. IMPRESSION: 1. Large centrally necrotic right lower lobe mass concerning the possibility of primary pulmonary malignancy. 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 2. Multiple enlarged and necrotic mediastinal and hilar nodes most compatible with nodal metastasis. 3. Enlarged retroperitoneal nodes compatible with metastasis. 4. Innumerable low-attenuation lesions within liver compatible metastasis. 5. Large lytic mass involving the right hemi sacrum as well as smaller lytic lesions involving the right ilium consistent with osseous metastasis. Electronically Signed   By: Lovey Newcomer M.D.   On: 12/17/2018 16:47   Ct Angio Chest Pe W Or Wo Contrast  Result Date: 12/20/2018 CLINICAL DATA:  Acute onset of tachycardia and shortness of breath. Recent diagnosis lung cancer EXAM: CT ANGIOGRAPHY CHEST WITH CONTRAST TECHNIQUE: Multidetector CT imaging of the chest was performed using the standard protocol during bolus administration of intravenous contrast. Multiplanar CT image reconstructions and MIPs were obtained to evaluate the vascular anatomy. CONTRAST:  60mL OMNIPAQUE IOHEXOL 350 MG/ML SOLN COMPARISON:  12/17/2018 FINDINGS: Cardiovascular: Normal heart size. Trace pericardial fluid or thickening-stable. Possible hypertrophic left ventricle. Multiple new branching pulmonary artery filling defects that are segmental to subsegmental and demarcated on series 5. The most proximal filling defects are seen and segmental right upper lobe branches Mediastinum/Nodes: Known low-density adenopathy in the mediastinum better depicted on parenchymal phase CT performed previously. Lungs/Pleura: Known right lower lobe mass with central low-density and pulmonary metastases on both sides. 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 Small right pleural effusion. Upper Abdomen: Extensive hepatic metastatic disease. Musculoskeletal: No acute finding. Please see Z vision log concerning efforts to reach on-call  physician. Overhead paging is disallowed. Critical Value/emergent results were called by telephone at the time of interpretation on 12/20/2018 at 6:51 am to Dr. Sidney Ace, who verbally acknowledged these results. Review of the MIP images confirms the above findings. IMPRESSION: 1. Positive for multifocal segmental and subsegmental acute pulmonary emboli.No indication of right heart strain. 2. Extensive thoracic and intra-abdominal malignancy as recently staged. Electronically Signed   By: Monte Fantasia M.D.   On: 12/20/2018 06:51   Mr Jeri Cos QQ Contrast  Result Date: 12/21/2018 CLINICAL DATA:  Non-small-cell lung cancer staging EXAM: MRI HEAD WITHOUT AND WITH CONTRAST TECHNIQUE: Multiplanar, multiecho pulse sequences of  the brain and surrounding structures were obtained without and with intravenous contrast. CONTRAST:  8 mL Gadovist IV COMPARISON:  None. FINDINGS: Brain: Image quality degraded by motion especially the postcontrast images. Multiple enhancing lesions the brain compatible with metastatic disease. Axial images significantly degraded by motion. Coronal and sagittal images have less motion. 8 mm enhancing lesion right occipital lobe 6 mm enhancing lesion right posterior temporal lobe above the tentorium 9 mm lesion right parietal cortex 3 mm lesion right parietal lobe 5 mm left inferior temporal lobe 5 mm right frontal lesion 5 mm left frontal lesion. No significant brain edema or midline shift. No acute infarct. Negative for hemorrhage Vascular: Normal arterial flow voids Skull and upper cervical spine: No focal skeletal lesion. 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 All less than 1 cm. No significant edema. Image quality degraded by motion which could limit detection of small lesions. Electronically Signed   By: Franchot Gallo M.D.   On: 12/21/2018 11:59   Mr Lumbar Spine Wo Contrast  Result Date: 12/03/2018 CLINICAL DATA:  Sciatica right side.  Right back and leg pain 4-5 months. EXAM: MRI LUMBAR SPINE WITHOUT CONTRAST TECHNIQUE: Multiplanar, multisequence MR imaging of the lumbar spine was performed. No intravenous contrast was administered. COMPARISON:  None. FINDINGS: Segmentation:  Normal Alignment:  4 mm anterolisthesis L4-5.  Mild dextroscoliosis. Vertebrae: 1 cm hyperintense lesion in the left posterior T12 vertebral body best seen on inversion recovery. Infiltrating mass lesion in the right sacrum involving the right sacrum. Tumor involves the right S1 vertebral body and pedicle and extends into the right S2 vertebral body. Tumor is low signal on T1 and hyperintense on T2. Extraosseous tumor with narrowing the foramen at L5-S1 and S1-2. Tumor also infiltrates the posterior iliac bone adjacent to the SI joint. Small right SI joint effusion. Conus medullaris and cauda equina: Conus extends to the L1-2 level. Conus and cauda equina appear normal. Paraspinal and other soft tissues: Negative for retroperitoneal adenopathy. Disc levels: L1-2: Mild disc and facet degeneration without stenosis L2-3: Moderate disc degeneration with disc bulging and spurring. Bilateral facet hypertrophy and mild spinal stenosis. L3-4: Disc degeneration and spondylosis. Bilateral facet hypertrophy. Moderate spinal stenosis. Mild to moderate subarticular stenosis bilaterally L4-5: 4 mm anterolisthesis. Diffuse bulging of the disc with moderate to severe facet degeneration. Moderate to severe spinal stenosis. Mild subarticular stenosis bilaterally L5-S1: Bilateral facet hypertrophy. Right foraminal encroachment due to tumor extension and spurring. 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 Tumor also infiltrates right posterior iliac bone. 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 Right L5 and S1 foraminal encroachment due to tumor extension into the soft tissues. Small lesion T12 also supportive of  metastatic disease. Chondrosarcoma or lymphoma right sacrum less likely. These results were called by telephone at the time of interpretation on 12/03/2018 at 10:27 am to [facility] , who verbally acknowledged these results. Electronically Signed   By: Franchot Gallo M.D.   On: 12/03/2018 10:36   Ct Abdomen Pelvis W Contrast  Result Date: 12/17/2018 CLINICAL DATA:  Follow-up exam. History of metastasis and lung nodules. EXAM: CT CHEST, ABDOMEN, AND PELVIS WITH CONTRAST TECHNIQUE: Multidetector CT imaging of the chest, abdomen and pelvis was performed following the standard protocol during bolus administration of intravenous contrast. CONTRAST:  123mL OMNIPAQUE IOHEXOL 300 MG/ML  SOLN COMPARISON:  Chest radiograph 12/16/2018; MRI lumbar spine 12/02/2018 FINDINGS: CT CHEST FINDINGS Cardiovascular: Normal heart  size. Trace fluid superior pericardial recess. Thoracic aortic vascular calcifications. Mediastinum/Nodes: No axillary lymphadenopathy. There is a 1.9 cm centrally necrotic precarinal node (image 22; series 2). There is a 1.7 cm right hilar node (image 27; series 2). There is a 1.9 cm left hilar node (image 30; series 2). There is a 1.2 cm centrally necrotic nodule along the posterior left pericardium (image 38; series 2). Mild wall thickening of the esophagus. Lungs/Pleura: Central airways are patent. There is a dominant centrally necrotic 6.2 x 5.9 cm mass within the right lower lobe (image 68; series 4). There is a 2.9 cm subpleural left lower lobe nodule (image 138; series 4). There are multiple bilateral pulmonary nodules. Reference 1.7 cm left lower lobe nodule (image 115; series 4). Reference 0.8 cm left upper lobe nodule (image 42; series 4). Reference 1.5 cm right middle lobe nodule (image 66; series 4). 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 There is a moderate right pleural effusion. Musculoskeletal: No aggressive or acute appearing osseous lesions. CT ABDOMEN PELVIS FINDINGS Hepatobiliary: There are innumerable low-attenuation lesions throughout the left and right hepatic lobes, the majority are subcentimeter in size. Gallbladder is unremarkable. No intrahepatic or extrahepatic biliary ductal dilatation. Pancreas: Unremarkable Spleen: Unremarkable Adrenals/Urinary Tract: Normal adrenal glands. Kidneys enhance symmetrically with contrast. Subcentimeter too small to characterize low-attenuation lesion inferior pole left kidney (image 77; series 2). Urinary bladder is decompressed. Stomach/Bowel: Stool throughout the colon. No abnormal bowel wall thickening or evidence for bowel obstruction. No free fluid or free intraperitoneal air. Normal morphology of the stomach. Vascular/Lymphatic: Normal caliber abdominal aorta. Peripheral calcified atherosclerotic plaque. 1.3 cm left periaortic lymph node (image 71; series 2). 1.1 cm aortocaval node (image 80; series 2). There is a 1.0 cm porta hepatic node (image 65; series 2). Reproductive: Prior hysterectomy. Adnexal structures are unremarkable. Other: None. Musculoskeletal: Lumbar spine degenerative changes. There is a 6.7 x 5.7 cm lytic soft tissue mass involving the right hemi sacrum (image 93; series 2) with soft tissue involving the sacral spinal canal. There is a 2.4 cm soft tissue lytic lesion within the posterior right ilium (image 92; series 2). Additional lytic lesions within the right ilium. IMPRESSION: 1. Large centrally necrotic right lower lobe mass concerning the possibility of primary pulmonary malignancy. 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 2. Multiple enlarged and necrotic mediastinal and hilar  nodes most compatible with nodal metastasis. 3. Enlarged retroperitoneal nodes compatible with metastasis. 4. Innumerable low-attenuation lesions within liver compatible metastasis. 5. Large lytic mass involving the right hemi sacrum as well as smaller lytic lesions involving the right ilium consistent with osseous metastasis. Electronically Signed   By: Lovey Newcomer M.D.   On: 12/17/2018 16:47   US Venous Img Lower Bilateral  Result Date: 12/20/2018 CLINICAL DATA:  Metastatic cancer, pulmonary emboli, anticoagulated EXAM: BILATERAL LOWER EXTREMITY VENOUS DOPPLER ULTRASOUND TECHNIQUE: Gray-scale sonography with graded compression, as well as color Doppler and duplex ultrasound were performed to evaluate the lower extremity deep venous systems from the level of the common femoral vein and including the common femoral, femoral, profunda femoral, popliteal and calf veins including the posterior tibial, peroneal and gastrocnemius veins when visible. The superficial great saphenous vein was also interrogated. Spectral  Doppler was utilized to evaluate flow at rest and with distal augmentation maneuvers in the common femoral, femoral and popliteal veins. COMPARISON:  None. FINDINGS: RIGHT LOWER EXTREMITY Common Femoral Vein: No evidence of thrombus. Normal compressibility, respiratory phasicity and response to augmentation. Saphenofemoral Junction: No evidence of thrombus. Normal compressibility and flow on color Doppler imaging. Profunda Femoral Vein: No evidence of thrombus. Normal compressibility and flow on color Doppler imaging. Femoral Vein: No evidence of thrombus. Normal compressibility, respiratory phasicity and response to augmentation. Popliteal Vein: No evidence of thrombus. Normal compressibility, respiratory phasicity and response to augmentation. Calf Veins: No evidence of thrombus. Normal compressibility and flow on color Doppler imaging. Superficial Great Saphenous Vein: No evidence of thrombus. Normal  compressibility. Venous Reflux:  Not assessed Other Findings:  None. LEFT LOWER EXTREMITY Common Femoral Vein: No evidence of thrombus. Normal compressibility, respiratory phasicity and response to augmentation. Saphenofemoral Junction: No evidence of thrombus. Normal compressibility and flow on color Doppler imaging. Profunda Femoral Vein: No evidence of thrombus. Normal compressibility and flow on color Doppler imaging. Femoral Vein: No evidence of thrombus. Normal compressibility, respiratory phasicity and response to augmentation. Popliteal Vein: No evidence of thrombus. Normal compressibility, respiratory phasicity and response to augmentation. Calf Veins: Left posterior tibial calf veins demonstrate intraluminal hypoechoic thrombus over a short segment. Vessel is noncompressible. Very low thrombus burden. Other calf veins appear patent. Superficial Great Saphenous Vein: No evidence of thrombus. Normal compressibility. Left small saphenous vein also demonstrates hypoechoic thrombus. Vessel is noncompressible. Very low thrombus burden. Findings compatible with left small saphenous vein superficial thrombosis. Venous Reflux:  Not assessed Other Findings:  None. IMPRESSION: Negative for right lower extremity DVT. Positive for left calf posterior tibial DVT. Very low thrombus burden. No propagation into the popliteal or femoral veins. Associated left small saphenous vein superficial thrombosis/thrombophlebitis. Electronically Signed   By: Jerilynn Mages.  Shick M.D.   On: 12/20/2018 15:49    Labs:  CBC: Recent Labs    12/19/18 0605 12/20/18 0435 12/21/18 0132 12/22/18 0403  WBC 11.2* 13.4* 18.5* 16.0*  HGB 12.9 12.8 12.4 12.0  HCT 39.3 38.9 37.4 36.6  PLT 193 189 191 205    COAGS: Recent Labs    12/18/18 0843 12/20/18 0809  INR 1.0 1.1  APTT  --  33    BMP: Recent Labs    12/19/18 0605 12/20/18 0435 12/21/18 0132 12/22/18 0403  NA 139 138 136 137  K 3.2* 3.4* 3.8 3.7  CL 105 103 103 102  CO2  27 27 26 25   GLUCOSE 100* 111* 110* 102*  BUN 17 23 19 20   CALCIUM 12.0* 11.3* 10.5* 9.8  CREATININE 0.45 0.43* 0.48 0.48  GFRNONAA >60 >60 >60 >60  GFRAA >60 >60 >60 >60    LIVER FUNCTION TESTS: Recent Labs    12/16/18 1505 12/17/18 2035  BILITOT 0.8 0.8  AST 88* 74*  ALT 114* 95*  ALKPHOS 166* 153*  PROT 7.2 6.4*  ALBUMIN 3.4* 2.9*    TUMOR MARKERS: No results for input(s): AFPTM, CEA, CA199, CHROMGRNA in the last 8760 hours.  Assessment and Plan:  73 year old with presumed metastatic disease and needs a tissue diagnosis.  I have reviewed the patient's cross-sectional imaging and feel that the right iliac bone lesion should be sufficient biopsy and low risk of bleeding with the patient being on anticoagulation for thromboembolic disease.  Discussed CT-guided core biopsy of the right iliac bone lesion.  We also discussed performing core biopsy of the right sacral lesion if  we could not get adequate tissue from the right ilium.  Risks and benefits of CT-guided bone lesion biopsy was discussed with the patient and/or patient's family including, but not limited to bleeding, infection, damage to adjacent structures or low yield requiring additional tests.  All of the questions were answered and there is agreement to proceed.  Consent signed and in chart.    Thank you for this interesting consult.  I greatly enjoyed meeting IYANI DRESNER and look forward to participating in their care.  A copy of this report was sent to the requesting provider on this date.  Electronically Signed: Burman Riis, MD 12/22/2018, 10:52 AM   I spent a total of 20 Minutes    in face to face in clinical consultation, greater than 50% of which was counseling/coordinating care for CT biopsy.

## 2018-12-22 NOTE — Progress Notes (Signed)
Dr Vianne Bulls made aware that HR 125, per Dr Vianne Bulls give ordered Lopressor but only IV 2.5mg 

## 2018-12-22 NOTE — Procedures (Signed)
Interventional Radiology Procedure:   Indications: Metastatic disease and needs tissue diagnosis  Procedure: CT guided biopsy of right iliac bone lesion  Findings: 4 soft tissue cores obtained from right ilium  Complications: None     EBL: less than 10 ml  Plan: Bedrest and may restart heparin in 2 hours.     Morayma Godown R. Anselm Pancoast, MD  Pager: [phone]

## 2018-12-22 NOTE — Progress Notes (Signed)
[place] for heparin Indication: pulmonary embolus  Allergies  Allergen Reactions  . Furadantin  [Nitrofurantoin]   . Phenobarbital Rash   Patient Measurements: Height: 5\' 6"  (167.6 cm) Weight: 190 lb 3.2 oz (86.3 kg) IBW/kg (Calculated) : 59.3 Heparin Dosing Weight:77.8 kg  Vital Signs: Temp: 98.4 F (36.9 C) (08/04 1918) BP: 131/78 (08/04 1918) Pulse Rate: 133 (08/04 1918)  Labs: Recent Labs    12/20/18 0435 12/20/18 0809  12/21/18 0132 12/21/18 0857 12/21/18 1733 12/22/18 0403  HGB 12.8  --   --  12.4  --   --  12.0  HCT 38.9  --   --  37.4  --   --  36.6  PLT 189  --   --  191  --   --  205  APTT  --  33  --   --   --   --   --   LABPROT  --  13.6  --   --   --   --   --   INR  --  1.1  --   --   --   --   --   HEPARINUNFRC  --   --    < > 0.31 0.45 0.48 0.21*  CREATININE 0.43*  --   --  0.48  --   --  0.48   < > = values in this interval not displayed.    Estimated Creatinine Clearance: 68.3 mL/min (by C-G formula based on SCr of 0.48 mg/dL).   Medical History: Past Medical History:  Diagnosis Date  . Allergy   . Cancer ([place]) 09/2014   Skin Cancer  . Hyperlipidemia   . Hypertension     Medications:  Scheduled:  . allopurinol  300 mg Oral Daily  . feeding supplement (ENSURE ENLIVE)  237 mL Oral BID BM  . fluticasone  2 spray Each Nare Daily  . folic acid  1 mg Oral Daily  . loratadine  10 mg Oral Daily  . mouth rinse  15 mL Mouth Rinse BID  . metoprolol succinate  25 mg Oral Daily  . montelukast  10 mg Oral Daily  . potassium chloride  20 mEq Oral BID  . sodium chloride flush  3 mL Intravenous Q12H    Assessment: Patient admitted for SOB s/t to lung mets that has possibly metastasized to liver. Patient has PE on CT and is going for liver biopsy this am at around 0930. Patient is to be started on heparin drip @ 0945 post-biopsy.  Heparin Course: 8/3 Heparin initiated at 1200 units/hr 8/3 @6 :10 HL 0.17,  Heparin 2300 units bolus, increase rate to 1500 units/hr 8/4 @ 01:32 HL 0.31, Heparin drip increased to 1550 units/hr 8/4 @ 08:57 HL 0.45 8/4 @ 1733 HL 0.48  8/5 @ 0403 HL 0.21  Goal of Therapy:  Heparin level 0.3-0.7 units/ml Monitor platelets by anticoagulation protocol: Yes   Plan:  Heparin level dropped to subtherapeutic range.  Confirmed with nurse that infusion was not interrupted w/ the exception of approx 5 minutes due to line occluded.   Will rebolus with 1200 units x 1 and increase infusion to 1700 units/hr  Recheck HL in 6 hours, continue to monitor daily HL and CBC's and adjust per anti-Xa levels.  Hart Robinsons, PharmD Clinical Pharmacist 12/22/2018

## 2018-12-22 NOTE — Progress Notes (Signed)
Nutrition Follow-up  DOCUMENTATION CODES:   Not applicable  INTERVENTION:  Continue Ensure Enlive po BID, each supplement provides 350 kcal and 20 grams of protein.  NUTRITION DIAGNOSIS:   Increased nutrient needs related to catabolic illness(lung mass with central necrosis concerning for malignancy) as evidenced by estimated needs.  Ongoing.  GOAL:   Patient will meet greater than or equal to 90% of their needs  Progressing.  MONITOR:   PO intake, Supplement acceptance, Labs, Weight trends, I & O's  REASON FOR ASSESSMENT:   Malnutrition Screening Tool    ASSESSMENT:   73 year old female with PMHx of HTN, HLD admitted with acute hypoxic respiratory failure, right lower lobe 6 cm lung mass with central necrosis concerning for malignancy.  Patient reports her appetite is improving slowly but has still not returned to normal. She understands importance of good nutrition and is still attempting to eat well at meals. She reports for breakfast yesterday she had toast with butter, cottage cheese, fruit. For lunch she had roast beef with potatoes and green beans. For dinner she had cottage cheese, yogurt, fruit, and sweet tea. She is also drinking her Ensure between meals. Discussed that patient is doing a great job choosing adequate protein at meals. Encouraged her to continue with adequate intake as she has increased calorie/protein needs and this will help prevent unintentional weight loss/loss of lean body mass. Patient is NPO for possible biopsy today.  Medications reviewed and include: allopurinol, folic acid 1 mg daily, potassium chloride 20 mEq BID, heparin gtt.  Labs reviewed.  Diet Order:   Diet Order            Diet NPO time specified  Diet effective midnight             EDUCATION NEEDS:   No education needs have been identified at this time  Skin:  Skin Assessment: Reviewed RN Assessment  Last BM:  12/18/2018 per chart  Height:   Ht Readings from Last 1  Encounters:  12/17/18 5\' 6"  (1.676 m)   Weight:   Wt Readings from Last 1 Encounters:  12/17/18 86.3 kg   Ideal Body Weight:  59.1 kg  BMI:  Body mass index is 30.7 kg/m.  Estimated Nutritional Needs:   Kcal:  6067-7034  Protein:  95-105 grams  Fluid:  1.8-2 L/day  Willey Blade, MS, RD, LDN Office: [phone] Pager: [phone] After Hours/Weekend Pager: [phone]

## 2018-12-22 NOTE — Progress Notes (Signed)
Per Dr Tasia Catchings and IR ok for pt to have her morning meds

## 2018-12-23 ENCOUNTER — Ambulatory Visit
Admission: RE | Admit: 2018-12-23 | Discharge: 2018-12-23 | Disposition: A | Discharge status: Home / Self Care | Payer: Medicare Other | Source: Ambulatory Visit | Attending: Radiation Oncology | Admitting: Radiation Oncology

## 2018-12-23 ENCOUNTER — Telehealth: Payer: Self-pay | Admitting: Physician Assistant

## 2018-12-23 ENCOUNTER — Encounter: Payer: Self-pay | Admitting: Oncology

## 2018-12-23 DIAGNOSIS — C3490 Malignant neoplasm of unspecified part of unspecified bronchus or lung: Secondary | ICD-10-CM | POA: Insufficient documentation

## 2018-12-23 DIAGNOSIS — C7931 Secondary malignant neoplasm of brain: Secondary | ICD-10-CM | POA: Insufficient documentation

## 2018-12-23 DIAGNOSIS — Z510 Encounter for antineoplastic radiation therapy: Secondary | ICD-10-CM | POA: Insufficient documentation

## 2018-12-23 LAB — CBC
HCT: 37.2 % (ref 36.0–46.0)
Hemoglobin: 12.2 g/dL (ref 12.0–15.0)
MCH: 30.9 pg (ref 26.0–34.0)
MCHC: 32.8 g/dL (ref 30.0–36.0)
MCV: 94.2 fL (ref 80.0–100.0)
Platelets: 225 10*3/uL (ref 150–400)
RBC: 3.95 MIL/uL (ref 3.87–5.11)
RDW: 13.1 % (ref 11.5–15.5)
WBC: 13.5 10*3/uL — ABNORMAL HIGH (ref 4.0–10.5)
nRBC: 0 % (ref 0.0–0.2)

## 2018-12-23 LAB — HEPARIN LEVEL (UNFRACTIONATED)
Heparin Unfractionated: 0.44 IU/mL (ref 0.30–0.70)
Heparin Unfractionated: 0.42 IU/mL (ref 0.30–0.70)

## 2018-12-23 MED ORDER — METOPROLOL SUCCINATE ER 50 MG PO TB24
50.0000 mg | ORAL_TABLET | Freq: Every day | ORAL | Status: DC
  Administered 2018-12-23 – 2018-12-26 (×4): 50 mg via ORAL
  Filled 2018-12-23 (×4): qty 1

## 2018-12-23 MED ORDER — GABAPENTIN 300 MG PO CAPS
300.0000 mg | ORAL_CAPSULE | Freq: Three times a day (TID) | ORAL | Status: DC
  Administered 2018-12-24 – 2018-12-26 (×9): 300 mg via ORAL
  Filled 2018-12-23 (×9): qty 1

## 2018-12-23 MED ORDER — ALPRAZOLAM 0.25 MG PO TABS
0.2500 mg | ORAL_TABLET | Freq: Every evening | ORAL | Status: DC | PRN
  Administered 2018-12-23 – 2018-12-25 (×3): 0.25 mg via ORAL
  Filled 2018-12-23 (×3): qty 1

## 2018-12-23 NOTE — Progress Notes (Signed)
Hematology/Oncology Follow Up Note [facility]  [phone]) [phone] [phone]  Patient Care Team: Rubye Beach as PCP - General (Family Medicine) Dasher, Rayvon Char, MD as Consulting Physician (Dermatology) Yolonda Kida, MD as Consulting Physician (Cardiology) Dingeldein, Remo Lipps, MD as Consulting Physician (Ophthalmology) Kipp Laurence, MD as Referring Physician (Audiology) Grant Fontana, [place] as Referring Physician (Chiropractic Medicine) Watt Climes, PA as Physician Assistant (Physician Assistant) Telford Nab, RN as Registered Nurse   Name of the patient: Elizabeth [name]  [id number]  March 07, 1946   INTERVAL HISTORY Patient is lying in bed, back on 6 L of high flow oxygen. She reported out of breath and hallucinated last night.  Per RN note, oxygen was 98% on 4 L high flow. Patient was assessed by respiratory, oxygen increased to 6 L.  Patient was instructed on breathing techniques to assist with respiratory status. This morning, she feels okay.  Breathing has not changed.  She was able to cough out some mucus. On heparin drip. Denies any pain today.  Review of Systems  Constitutional: Positive for appetite change, fatigue and unexpected weight change.  Respiratory: Positive for cough and shortness of breath.   Cardiovascular: Negative for chest pain.  Gastrointestinal: Negative for abdominal distention and abdominal pain.  Genitourinary: Negative for frequency and hematuria.   Musculoskeletal: Positive for back pain.  Skin: Negative for rash.  Neurological: Negative for dizziness.    Allergies  Allergen Reactions   Furadantin  [Nitrofurantoin]    Phenobarbital Rash     Past Medical History:  Diagnosis Date   Allergy    Cancer ([place]) 09/2014   Skin Cancer   Hyperlipidemia    Hypertension      Past Surgical History:  Procedure Laterality Date   ABDOMINAL HYSTERECTOMY  1989   due to menorrhagia    COLONOSCOPY     [place]   SKIN CANCER EXCISION  10/2014   on chest; Dr. Evorn Gong   TRIGGER FINGER RELEASE Right 2000   WRIST ARTHROPLASTY Left 1991    Social History   Socioeconomic History   Marital status: Married    Spouse name: Eulas Post   Number of children: 2   Years of education: Not on file   Highest education level: Bachelor's degree (e.g., BA, AB, BS)  Occupational History   Occupation: retired  Scientist, product/process development strain: Not hard at [employer]    Worry: Never true    Inability: Never true   Transportation needs    Medical: No    Non-medical: No  Tobacco Use   Smoking status: Former Smoker    Packs/day: 0.50    Years: 35.00    Pack years: 17.50    Quit date: 05/20/2003    Years since quitting: 15.6   Smokeless tobacco: Never Used  Substance and Sexual Activity   Alcohol use: Yes    Alcohol/week: 14.0 standard drinks    Types: 14 Glasses of wine per week    Comment: occasional; 12/16/18 - states no alcohol since 12/02/18    Drug use: No   Sexual activity: Not on file  Lifestyle   Physical activity    Days per week: 0 days    Minutes per session: 0 min   Stress: Not at all  Relationships   Social connections    Talks on phone: Patient refused    Gets together: Patient refused    Attends religious  service: Patient refused    Active member of club or organization: Patient refused    Attends meetings of clubs or organizations: Patient refused    Relationship status: Patient refused   Intimate partner violence    Fear of current or ex partner: Patient refused    Emotionally abused: Patient refused    Physically abused: Patient refused    Forced sexual activity: Patient refused  Other Topics Concern   Not on file  Social History Narrative   Not on file    Family History  Problem Relation Age of Onset   Arthritis Mother    Cerebrovascular Disease Mother    Heart disease  Mother    Vascular Disease Mother    Hypertension Father    COPD Father    Throat cancer Father    Melanoma Father    Prostate cancer Father    Skin cancer Brother    Skin cancer Brother    Breast cancer Paternal Aunt 72     Current Facility-Administered Medications:    0.9 %  sodium chloride infusion, 250 mL, Intravenous, PRN, Seals, Theo Dills, NP, Stopped at 12/22/18 2335   acetaminophen (TYLENOL) tablet 650 mg, 650 mg, Oral, Q6H PRN, 650 mg at 12/20/18 0358 **OR** acetaminophen (TYLENOL) suppository 650 mg, 650 mg, Rectal, Q6H PRN, Seals, Theo Dills, NP   allopurinol (ZYLOPRIM) tablet 300 mg, 300 mg, Oral, Daily, Earlie Server, MD, 300 mg at 12/22/18 0933   azithromycin (ZITHROMAX) 500 mg in sodium chloride 0.9 % 250 mL IVPB, 500 mg, Intravenous, Q24H, Epifanio Lesches, MD, Stopped at 12/22/18 1753   cefTRIAXone (ROCEPHIN) 1 g in sodium chloride 0.9 % 100 mL IVPB, 1 g, Intravenous, Q24H, Epifanio Lesches, MD, Stopped at 12/22/18 1648   feeding supplement (ENSURE ENLIVE) (ENSURE ENLIVE) liquid 237 mL, 237 mL, Oral, BID BM, Gouru, Aruna, MD, 237 mL at 12/22/18 1231   fluticasone (FLONASE) 50 MCG/ACT nasal spray 2 spray, 2 spray, Each Nare, Daily, Seals, Theo Dills, NP, 2 spray at 26/71/24 5809   folic acid (FOLVITE) tablet 1 mg, 1 mg, Oral, Daily, Seals, Angela H, NP, 1 mg at 12/22/18 0933   guaiFENesin-dextromethorphan (ROBITUSSIN DM) 100-10 MG/5ML syrup 5 mL, 5 mL, Oral, Q4H PRN, Gouru, Aruna, MD, 5 mL at 12/22/18 2339   heparin ADULT infusion 100 units/mL (25000 units/23mL sodium chloride 0.45%), 1,850 Units/hr, Intravenous, Continuous, Hall, Scott A, RPH, Last Rate: 18.5 mL/hr at 12/23/18 0300, 1,850 Units/hr at 12/23/18 0300   levalbuterol (XOPENEX) nebulizer solution 0.63 mg, 0.63 mg, Nebulization, Q6H PRN, Gouru, Aruna, MD, 0.63 mg at 12/23/18 0740   loratadine (CLARITIN) tablet 10 mg, 10 mg, Oral, Daily, Seals, Angela H, NP, 10 mg at 12/22/18 9833   MEDLINE  mouth rinse, 15 mL, Mouth Rinse, BID, Seals, Angela H, NP, 15 mL at 12/22/18 2205   metoprolol succinate (TOPROL-XL) 24 hr tablet 50 mg, 50 mg, Oral, Daily, Epifanio Lesches, MD   metoprolol tartrate (LOPRESSOR) injection 5 mg, 5 mg, Intravenous, Q6H PRN, Gouru, Aruna, MD, 2.5 mg at 12/22/18 1256   montelukast (SINGULAIR) tablet 10 mg, 10 mg, Oral, Daily, Seals, Angela H, NP, 10 mg at 12/22/18 8250   morphine 2 MG/ML injection 1 mg, 1 mg, Intravenous, Q4H PRN, Mansy, Jan A, MD, 1 mg at 12/22/18 2322   morphine 2 MG/ML injection 2 mg, 2 mg, Intravenous, Q4H PRN, Mansy, Jan A, MD   ondansetron (ZOFRAN) tablet 4 mg, 4 mg, Oral, Q6H PRN **OR** ondansetron (ZOFRAN) injection 4 mg, 4 mg,  Intravenous, Q6H PRN, Seals, Theo Dills, NP   polyethylene glycol (MIRALAX / GLYCOLAX) packet 17 g, 17 g, Oral, Daily PRN, Seals, Theo Dills, NP   potassium chloride (KLOR-CON) packet 20 mEq, 20 mEq, Oral, BID, Seals, Angela H, NP, 20 mEq at 12/22/18 2205   sodium chloride flush (NS) 0.9 % injection 3 mL, 3 mL, Intravenous, Q12H, Seals, Angela H, NP, 3 mL at 12/22/18 0934   sodium chloride flush (NS) 0.9 % injection 3 mL, 3 mL, Intravenous, PRN, Seals, Angela H, NP   tiZANidine (ZANAFLEX) tablet 4 mg, 4 mg, Oral, TID PRN, Fritzi Mandes, MD, 4 mg at 12/22/18 2322  Physical exam:  Vitals:   12/22/18 1957 12/22/18 2057 12/23/18 0313 12/23/18 0742  BP:  115/82 113/61   Pulse:  (!) 109 (!) 101   Resp:  15    Temp:  98.9 F (37.2 C) 98.7 F (37.1 C)   TempSrc:  Oral Oral   SpO2: 99% 98% 97% 95%  Weight:      Height:        GENERAL: Not in acute distress. SKIN:  No rashes or significant lesions  HEAD: Normocephalic, No masses, lesions, tenderness or abnormalities  EYES: Conjunctiva are pink, non icteric ENT: External ears normal ,lips , buccal mucosa, and tongue normal and mucous membranes are moist  LYMPH: No palpable lymphadenopathy  LUNGS: Decreased breath sound on right lower base.  Some  wheezing HEART: Tachycardia ABDOMEN: Abdomen soft, non-tender, normal bowel sounds, MUSCULOSKELETAL: No CVA tenderness and no tenderness on percussion of the back or rib cage.  EXTREMITIES: No edema, no skin discoloration or tenderness NEURO: Alert & oriented, no focal motor/sensory deficits.      CMP Latest Ref Rng & Units 12/22/2018  Glucose 70 - 99 mg/dL 102(H)  BUN 8 - 23 mg/dL 20  Creatinine 0.44 - 1.00 mg/dL 0.48  Sodium 135 - 145 mmol/L 137  Potassium 3.5 - 5.1 mmol/L 3.7  Chloride 98 - 111 mmol/L 102  CO2 22 - 32 mmol/L 25  Calcium 8.9 - 10.3 mg/dL 9.8  Total Protein 6.5 - 8.1 g/dL -  Total Bilirubin 0.3 - 1.2 mg/dL -  Alkaline Phos 38 - 126 U/L -  AST 15 - 41 U/L -  ALT 0 - 44 U/L -   CBC Latest Ref Rng & Units 12/23/2018  WBC 4.0 - 10.5 K/uL 13.5(H)  Hemoglobin 12.0 - 15.0 g/dL 12.2  Hematocrit 36.0 - 46.0 % 37.2  Platelets 150 - 400 K/uL 225   RADIOGRAPHIC STUDIES: I have personally reviewed the radiological images as listed and agreed with the findings in the report.  Dg Chest 2 View  Result Date: 12/17/2018 CLINICAL DATA:  History of cancer. Former smoker. Shortness of breath. EXAM: CHEST - 2 VIEW COMPARISON:  03/20/2016.  06/28/2012. FINDINGS: Right hilar fullness. Diffuse bilateral interstitial prominence. Multiple bilateral pulmonary nodular densities. These findings suggest malignancy with possible right hilar adenopathy, interstitial tumor spread, and metastatic disease. An inflammatory or infectious process including active granulomatous disease could also present in this fashion. Contrast-enhanced chest CT should be considered for further evaluation. No pleural effusion or pneumothorax. Heart size normal. Thoracic spine scoliosis. IMPRESSION: Right hilar fullness, diffuse bilateral interstitial prominence, and multiple bilateral pulmonary nodular densities. These findings suggest malignancy with possible right hilar adenopathy, interstitial tumor spread, and  metastatic disease. An inflammatory or infectious process including active granulomatous disease could also present this fashion. Contrast-enhanced chest CT should be considered for further evaluation. Electronically Signed  By: [place]   On: 12/17/2018 08:14   Ct Chest W Contrast  Result Date: 12/17/2018 CLINICAL DATA:  Follow-up exam. History of metastasis and lung nodules. EXAM: CT CHEST, ABDOMEN, AND PELVIS WITH CONTRAST TECHNIQUE: Multidetector CT imaging of the chest, abdomen and pelvis was performed following the standard protocol during bolus administration of intravenous contrast. CONTRAST:  179mL OMNIPAQUE IOHEXOL 300 MG/ML  SOLN COMPARISON:  Chest radiograph 12/16/2018; MRI lumbar spine 12/02/2018 FINDINGS: CT CHEST FINDINGS Cardiovascular: Normal heart size. Trace fluid superior pericardial recess. Thoracic aortic vascular calcifications. Mediastinum/Nodes: No axillary lymphadenopathy. There is a 1.9 cm centrally necrotic precarinal node (image 22; series 2). There is a 1.7 cm right hilar node (image 27; series 2). There is a 1.9 cm left hilar node (image 30; series 2). There is a 1.2 cm centrally necrotic nodule along the posterior left pericardium (image 38; series 2). Mild wall thickening of the esophagus. Lungs/Pleura: Central airways are patent. There is a dominant centrally necrotic 6.2 x 5.9 cm mass within the right lower lobe (image 68; series 4). There is a 2.9 cm subpleural left lower lobe nodule (image 138; series 4). There are multiple bilateral pulmonary nodules. Reference 1.7 cm left lower lobe nodule (image 115; series 4). Reference 0.8 cm left upper lobe nodule (image 42; series 4). Reference 1.5 cm right middle lobe nodule (image 66; series 4). 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 There is a moderate right pleural effusion. Musculoskeletal: No aggressive or acute appearing osseous lesions. CT ABDOMEN PELVIS FINDINGS Hepatobiliary: There are innumerable low-attenuation lesions throughout the left and right hepatic lobes, the majority are subcentimeter in size. Gallbladder is unremarkable. No intrahepatic or extrahepatic biliary ductal dilatation. Pancreas: Unremarkable Spleen: Unremarkable Adrenals/Urinary Tract: Normal adrenal glands. Kidneys enhance symmetrically with contrast. Subcentimeter too small to characterize low-attenuation lesion inferior pole left kidney (image 77; series 2). Urinary bladder is decompressed. Stomach/Bowel: Stool throughout the colon. No abnormal bowel wall thickening or evidence for bowel obstruction. No free fluid or free intraperitoneal air. Normal morphology of the stomach. Vascular/Lymphatic: Normal caliber abdominal aorta. Peripheral calcified atherosclerotic plaque. 1.3 cm left periaortic lymph node (image 71; series 2). 1.1 cm aortocaval node (image 80; series 2). There is a 1.0 cm porta hepatic node (image 65; series 2). Reproductive: Prior hysterectomy. Adnexal structures are unremarkable. Other: None. Musculoskeletal: Lumbar spine degenerative changes. There is a 6.7 x 5.7 cm lytic soft tissue mass involving the right hemi sacrum (image 93; series 2) with soft tissue involving the sacral spinal canal. There is a 2.4 cm soft tissue lytic lesion within the posterior right ilium (image 92; series 2). Additional lytic lesions within the right ilium. IMPRESSION: 1. Large centrally necrotic right lower lobe mass concerning the possibility of primary pulmonary malignancy. 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 2. Multiple enlarged and necrotic mediastinal and hilar nodes most compatible with  nodal metastasis. 3. Enlarged retroperitoneal nodes compatible with metastasis. 4. Innumerable low-attenuation lesions within liver compatible metastasis. 5. Large lytic mass involving the right hemi sacrum as well as smaller lytic lesions involving the right ilium consistent with osseous metastasis. Electronically Signed   By: Lovey Newcomer M.D.   On: 12/17/2018 16:47   Ct Angio  Chest Pe W Or Wo Contrast  Result Date: 12/20/2018 CLINICAL DATA:  Acute onset of tachycardia and shortness of breath. Recent diagnosis lung cancer EXAM: CT ANGIOGRAPHY CHEST WITH CONTRAST TECHNIQUE: Multidetector CT imaging of the chest was performed using the standard protocol during bolus administration of intravenous contrast. Multiplanar CT image reconstructions and MIPs were obtained to evaluate the vascular anatomy. CONTRAST:  20mL OMNIPAQUE IOHEXOL 350 MG/ML SOLN COMPARISON:  12/17/2018 FINDINGS: Cardiovascular: Normal heart size. Trace pericardial fluid or thickening-stable. Possible hypertrophic left ventricle. Multiple new branching pulmonary artery filling defects that are segmental to subsegmental and demarcated on series 5. The most proximal filling defects are seen and segmental right upper lobe branches Mediastinum/Nodes: Known low-density adenopathy in the mediastinum better depicted on parenchymal phase CT performed previously. Lungs/Pleura: Known right lower lobe mass with central low-density and pulmonary metastases on both sides. 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 Small right pleural effusion. Upper Abdomen: Extensive hepatic metastatic disease. Musculoskeletal: No acute finding. Please see Z vision log concerning efforts to reach on-call physician. Overhead paging is disallowed. Critical Value/emergent results were called by telephone at the time of interpretation on 12/20/2018 at 6:51 am to Dr. Sidney Ace, who verbally acknowledged these results. Review of the MIP images confirms  the above findings. IMPRESSION: 1. Positive for multifocal segmental and subsegmental acute pulmonary emboli.No indication of right heart strain. 2. Extensive thoracic and intra-abdominal malignancy as recently staged. Electronically Signed   By: Monte Fantasia M.D.   On: 12/20/2018 06:51   Mr Jeri Cos BZ Contrast  Result Date: 12/21/2018 CLINICAL DATA:  Non-small-cell lung cancer staging EXAM: MRI HEAD WITHOUT AND WITH CONTRAST TECHNIQUE: Multiplanar, multiecho pulse sequences of the brain and surrounding structures were obtained without and with intravenous contrast. CONTRAST:  8 mL Gadovist IV COMPARISON:  None. FINDINGS: Brain: Image quality degraded by motion especially the postcontrast images. Multiple enhancing lesions the brain compatible with metastatic disease. Axial images significantly degraded by motion. Coronal and sagittal images have less motion. 8 mm enhancing lesion right occipital lobe 6 mm enhancing lesion right posterior temporal lobe above the tentorium 9 mm lesion right parietal cortex 3 mm lesion right parietal lobe 5 mm left inferior temporal lobe 5 mm right frontal lesion 5 mm left frontal lesion. No significant brain edema or midline shift. No acute infarct. Negative for hemorrhage Vascular: Normal arterial flow voids Skull and upper cervical spine: No focal skeletal lesion. 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 All less than 1 cm. No significant edema. Image quality degraded by motion which could limit detection of small lesions. Electronically Signed   By: Franchot Gallo M.D.   On: 12/21/2018 11:59   Mr Lumbar Spine Wo Contrast  Result Date: 12/03/2018 CLINICAL DATA:  Sciatica right side. Right back and leg pain 4-5 months. EXAM: MRI LUMBAR SPINE WITHOUT CONTRAST TECHNIQUE: Multiplanar, multisequence MR imaging of the lumbar spine was performed. No intravenous contrast was administered. COMPARISON:  None. FINDINGS:  Segmentation:  Normal Alignment:  4 mm anterolisthesis L4-5.  Mild dextroscoliosis. Vertebrae: 1 cm hyperintense lesion in the left posterior T12 vertebral body best seen on inversion recovery. Infiltrating mass lesion in the right sacrum involving the right sacrum. Tumor involves the right S1 vertebral body and pedicle and extends into the right S2 vertebral body. Tumor is low signal on T1 and hyperintense on T2. Extraosseous tumor with narrowing the foramen at L5-S1 and S1-2. Tumor also infiltrates the posterior iliac bone adjacent  to the SI joint. Small right SI joint effusion. Conus medullaris and cauda equina: Conus extends to the L1-2 level. Conus and cauda equina appear normal. Paraspinal and other soft tissues: Negative for retroperitoneal adenopathy. Disc levels: L1-2: Mild disc and facet degeneration without stenosis L2-3: Moderate disc degeneration with disc bulging and spurring. Bilateral facet hypertrophy and mild spinal stenosis. L3-4: Disc degeneration and spondylosis. Bilateral facet hypertrophy. Moderate spinal stenosis. Mild to moderate subarticular stenosis bilaterally L4-5: 4 mm anterolisthesis. Diffuse bulging of the disc with moderate to severe facet degeneration. Moderate to severe spinal stenosis. Mild subarticular stenosis bilaterally L5-S1: Bilateral facet hypertrophy. Right foraminal encroachment due to tumor extension and spurring. 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 Tumor also infiltrates right posterior iliac bone. 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 Right L5 and S1 foraminal encroachment due to tumor extension into the soft tissues. Small lesion T12 also supportive of metastatic disease. Chondrosarcoma or lymphoma right sacrum less likely. These results were called by telephone at the time of interpretation on 12/03/2018 at 10:27 am to [facility] , who verbally acknowledged these results.  Electronically Signed   By: Franchot Gallo M.D.   On: 12/03/2018 10:36   Ct Abdomen Pelvis W Contrast  Result Date: 12/17/2018 CLINICAL DATA:  Follow-up exam. History of metastasis and lung nodules. EXAM: CT CHEST, ABDOMEN, AND PELVIS WITH CONTRAST TECHNIQUE: Multidetector CT imaging of the chest, abdomen and pelvis was performed following the standard protocol during bolus administration of intravenous contrast. CONTRAST:  120mL OMNIPAQUE IOHEXOL 300 MG/ML  SOLN COMPARISON:  Chest radiograph 12/16/2018; MRI lumbar spine 12/02/2018 FINDINGS: CT CHEST FINDINGS Cardiovascular: Normal heart size. Trace fluid superior pericardial recess. Thoracic aortic vascular calcifications. Mediastinum/Nodes: No axillary lymphadenopathy. There is a 1.9 cm centrally necrotic precarinal node (image 22; series 2). There is a 1.7 cm right hilar node (image 27; series 2). There is a 1.9 cm left hilar node (image 30; series 2). There is a 1.2 cm centrally necrotic nodule along the posterior left pericardium (image 38; series 2). Mild wall thickening of the esophagus. Lungs/Pleura: Central airways are patent. There is a dominant centrally necrotic 6.2 x 5.9 cm mass within the right lower lobe (image 68; series 4). There is a 2.9 cm subpleural left lower lobe nodule (image 138; series 4). There are multiple bilateral pulmonary nodules. Reference 1.7 cm left lower lobe nodule (image 115; series 4). Reference 0.8 cm left upper lobe nodule (image 42; series 4). Reference 1.5 cm right middle lobe nodule (image 66; series 4). 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 There is a moderate right pleural effusion. Musculoskeletal: No aggressive or acute appearing osseous lesions. CT ABDOMEN PELVIS FINDINGS Hepatobiliary: There are innumerable  low-attenuation lesions throughout the left and right hepatic lobes, the majority are subcentimeter in size. Gallbladder is unremarkable. No intrahepatic or extrahepatic biliary ductal dilatation. Pancreas: Unremarkable Spleen: Unremarkable Adrenals/Urinary Tract: Normal adrenal glands. Kidneys enhance symmetrically with contrast. Subcentimeter too small to characterize low-attenuation lesion inferior pole left kidney (image 77; series 2). Urinary bladder is decompressed. Stomach/Bowel: Stool throughout the colon. No abnormal bowel wall thickening or evidence for bowel obstruction. No free fluid or free intraperitoneal air. Normal morphology of the stomach. Vascular/Lymphatic: Normal caliber abdominal aorta. Peripheral calcified atherosclerotic plaque. 1.3 cm left periaortic  lymph node (image 71; series 2). 1.1 cm aortocaval node (image 80; series 2). There is a 1.0 cm porta hepatic node (image 65; series 2). Reproductive: Prior hysterectomy. Adnexal structures are unremarkable. Other: None. Musculoskeletal: Lumbar spine degenerative changes. There is a 6.7 x 5.7 cm lytic soft tissue mass involving the right hemi sacrum (image 93; series 2) with soft tissue involving the sacral spinal canal. There is a 2.4 cm soft tissue lytic lesion within the posterior right ilium (image 92; series 2). Additional lytic lesions within the right ilium. IMPRESSION: 1. Large centrally necrotic right lower lobe mass concerning the possibility of primary pulmonary malignancy. 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 2. Multiple enlarged and necrotic mediastinal and hilar nodes most compatible with nodal metastasis. 3. Enlarged retroperitoneal nodes compatible with metastasis. 4. Innumerable low-attenuation lesions within liver compatible metastasis. 5. Large lytic mass involving the right  hemi sacrum as well as smaller lytic lesions involving the right ilium consistent with osseous metastasis. Electronically Signed   By: Lovey Newcomer M.D.   On: 12/17/2018 16:47   US Venous Img Lower Bilateral  Result Date: 12/20/2018 CLINICAL DATA:  Metastatic cancer, pulmonary emboli, anticoagulated EXAM: BILATERAL LOWER EXTREMITY VENOUS DOPPLER ULTRASOUND TECHNIQUE: Gray-scale sonography with graded compression, as well as color Doppler and duplex ultrasound were performed to evaluate the lower extremity deep venous systems from the level of the common femoral vein and including the common femoral, femoral, profunda femoral, popliteal and calf veins including the posterior tibial, peroneal and gastrocnemius veins when visible. The superficial great saphenous vein was also interrogated. Spectral Doppler was utilized to evaluate flow at rest and with distal augmentation maneuvers in the common femoral, femoral and popliteal veins. COMPARISON:  None. FINDINGS: RIGHT LOWER EXTREMITY Common Femoral Vein: No evidence of thrombus. Normal compressibility, respiratory phasicity and response to augmentation. Saphenofemoral Junction: No evidence of thrombus. Normal compressibility and flow on color Doppler imaging. Profunda Femoral Vein: No evidence of thrombus. Normal compressibility and flow on color Doppler imaging. Femoral Vein: No evidence of thrombus. Normal compressibility, respiratory phasicity and response to augmentation. Popliteal Vein: No evidence of thrombus. Normal compressibility, respiratory phasicity and response to augmentation. Calf Veins: No evidence of thrombus. Normal compressibility and flow on color Doppler imaging. Superficial Great Saphenous Vein: No evidence of thrombus. Normal compressibility. Venous Reflux:  Not assessed Other Findings:  None. LEFT LOWER EXTREMITY Common Femoral Vein: No evidence of thrombus. Normal compressibility, respiratory phasicity and response to augmentation.  Saphenofemoral Junction: No evidence of thrombus. Normal compressibility and flow on color Doppler imaging. Profunda Femoral Vein: No evidence of thrombus. Normal compressibility and flow on color Doppler imaging. Femoral Vein: No evidence of thrombus. Normal compressibility, respiratory phasicity and response to augmentation. Popliteal Vein: No evidence of thrombus. Normal compressibility, respiratory phasicity and response to augmentation. Calf Veins: Left posterior tibial calf veins demonstrate intraluminal hypoechoic thrombus over a short segment. Vessel is noncompressible. Very low thrombus burden. Other calf veins appear patent. Superficial Great Saphenous Vein: No evidence of thrombus. Normal compressibility. Left small saphenous vein also demonstrates hypoechoic thrombus. Vessel is noncompressible. Very low thrombus burden. Findings compatible with left small saphenous vein superficial thrombosis. Venous Reflux:  Not assessed Other Findings:  None. IMPRESSION: Negative for right lower extremity DVT. Positive for left calf posterior tibial DVT. Very low thrombus burden. No propagation into the popliteal or femoral veins. Associated left small saphenous vein  superficial thrombosis/thrombophlebitis. Electronically Signed   By: Jerilynn Mages.  Shick M.D.   On: 12/20/2018 15:49   Ct Biopsy  Result Date: 12/22/2018 INDICATION: 73 year old with probable metastatic disease and needs a tissue diagnosis. Lytic lesion involving the posterior right ilium. EXAM: CT-GUIDED CORE BIOPSY OF RIGHT ILIAC BONE LESION MEDICATIONS: None. ANESTHESIA/SEDATION: Moderate (conscious) sedation was employed during this procedure. A total of Versed 1.0 mg and Fentanyl 50 mcg was administered intravenously. Moderate Sedation Time: 11 minutes. The patient's level of consciousness and vital signs were monitored continuously by radiology nursing throughout the procedure under my direct supervision. FLUOROSCOPY TIME:  None COMPLICATIONS: None  immediate. PROCEDURE: Informed written consent was obtained from the patient after a thorough discussion of the procedural risks, benefits and alternatives. All questions were addressed. A timeout was performed prior to the initiation of the procedure. Patient was placed prone. CT images through the pelvis were obtained. The destructive lesion in the posterior right ilium was targeted for biopsy. The overlying skin was prepped with chlorhexidine and sterile field was created. Skin and soft tissues were anesthetized with 1% lidocaine. Using CT guidance, 17 gauge coaxial needle was easily directed through the cortex and into the soft tissue lesion within the bone. A total of 4 core biopsies were obtained with an 18 gauge device. Specimens placed in formalin. Needle was removed without complication. FINDINGS: Soft tissue lesions involving the right sacrum and the posterior right ilium. Needle was easily advanced into the right posterior iliac bone lesion. Four soft tissue core biopsies were obtained. IMPRESSION: CT-guided core biopsies of the posterior right iliac bone lesion. Electronically Signed   By: Markus Daft M.D.   On: 12/22/2018 11:55     Assessment and plan #Acute bilateral multifocal PE, agree with anticoagulation with heparin  CT image finding was independent reviewed by me and discussed with patient. 12/20/2018 lower extremity Doppler showed.  Left calf posterior tibial DVT, low thrombus burden. Status post biopsy.  Resumed on heparin.  Hemoglobin stable. Continue supportive care.Marland Kitchen   #Likely metastatic lung cancer with extensive lymphadenopathy, liver and bone involvement. Aggressive malignant process. 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 No edema.  She will need palliative radiation outpatient.  Currently asymptomatic. Status post right ilium bone biopsy .  Awaiting pathology results.   #Acute hypoxic  respiratory failure, multifactorial secondary to lung mass, lymphangitic spread, pleural effusion, PE.  Continue supportive care.  Oxygen to be weaned down as tolerated. #Leukocytosis.  Likely reactive. CAP.  Antibiotics ceftriaxone and azithromycin for coverage of pneumonia  #Hypercalcemia, secondary to bone metastasis and malignancy.  Status post Zometa 4 mg x 1.  Calcium level normalized to 9.8   #High uric acid, continue allopurinol #Lower back/hip pain.  Exacerbated with motion.  Continue morphine as needed.  #Transaminitis, due to malignancy.  .  #Goal of care, we discussed briefly that her condition is most likely incurable.  Awaiting tissue diagnosis.  Patient desires chemotherapy treatments after diagnosis is finalized. CODE STATUS   Full code  Earlie Server, MD, PhD Hematology Oncology [place] at [facility]  12/23/2018

## 2018-12-23 NOTE — Telephone Encounter (Signed)
Pt's husband Elizabeth [name] is requesting Tawanna Sat return his call. Elizabeth [name] stated that pt is in the hospital with cancer and he isn't pleased with how the MRI progress was handled and he wants to speak with Tawanna Sat about the MRI because "someone needs to be accountable for this". Please advise. Thanks TNP

## 2018-12-23 NOTE — Telephone Encounter (Signed)
Called and spoke to husband. Case was handled in a week and a half time, but was approx 2 months from her pain onset when xray was first obtained. Discussed her case and the medical limitations. He was reassured and conversation was pleasant. Advised if they need anything at all to please call the office.

## 2018-12-23 NOTE — Telephone Encounter (Signed)
Called and went to VM

## 2018-12-23 NOTE — Progress Notes (Signed)
[place] for heparin Indication: pulmonary embolus  Allergies  Allergen Reactions  . Furadantin  [Nitrofurantoin]   . Phenobarbital Rash   Patient Measurements: Height: 5\' 6"  (167.6 cm) Weight: 190 lb 3.2 oz (86.3 kg) IBW/kg (Calculated) : 59.3 Heparin Dosing Weight:77.8 kg  Vital Signs: Temp: 98.7 F (37.1 C) (08/06 0313) Temp Source: Oral (08/06 0313) BP: 113/61 (08/06 0313) Pulse Rate: 101 (08/06 0313)  Labs: Recent Labs    12/21/18 0132  12/22/18 0403 12/22/18 2124 12/23/18 0521 12/23/18 1119  HGB 12.4  --  12.0  --  12.2  --   HCT 37.4  --  36.6  --  37.2  --   PLT 191  --  205  --  225  --   HEPARINUNFRC 0.31   < > 0.21* 0.24* 0.44 0.42  CREATININE 0.48  --  0.48  --   --   --    < > = values in this interval not displayed.    Estimated Creatinine Clearance: 68.3 mL/min (by C-G formula based on SCr of 0.48 mg/dL).   Medical History: Past Medical History:  Diagnosis Date  . Allergy   . Cancer ([place]) 09/2014   Skin Cancer  . Hyperlipidemia   . Hypertension     Medications:  Scheduled:  . allopurinol  300 mg Oral Daily  . feeding supplement (ENSURE ENLIVE)  237 mL Oral BID BM  . fluticasone  2 spray Each Nare Daily  . folic acid  1 mg Oral Daily  . loratadine  10 mg Oral Daily  . mouth rinse  15 mL Mouth Rinse BID  . metoprolol succinate  50 mg Oral Daily  . montelukast  10 mg Oral Daily  . potassium chloride  20 mEq Oral BID  . sodium chloride flush  3 mL Intravenous Q12H    Assessment: Patient admitted for SOB s/t to lung mets that has possibly metastasized to liver. Patient has PE on CT and is going for liver biopsy this am at around 0930. Patient is to be started on heparin drip @ 0945 post-biopsy.  Heparin Course: 8/3 Heparin initiated at 1200 units/hr 8/3 @6 :10 HL 0.17, Heparin 2300 units bolus, increase rate to 1500 units/hr 8/4 @ 01:32 HL 0.31, Heparin drip increased to 1550 units/hr 8/4 @ 08:57  HL 0.45 8/4 @ 1733 HL 0.48  8/5 @ 0403 HL 0.21 8/5: Heparin was held today at 0930 then restarted at 1320 at 1700 units/hr.  Confirmed with nurse there have been on interruptions in infusion since restart.  HL low at 0.24 (at 2124)- Infusion increased to 1850 units/hr   8/6 @ 0521 HL: 0.44. Level is therapeutic x 1. Continue heparin infusion rate of 1850 units/hr    Goal of Therapy:  Heparin level 0.3-0.7 units/ml Monitor platelets by anticoagulation protocol: Yes   Plan:  8/6 @ 1130 HL 0.42 Level is therapeutic x 2 - will continue current rate of 1850 units/hr and continue to monitor daily HL and CBC's and adjust per anti-Xa levels.  Lu Duffel, PharmD, BCPS Clinical Pharmacist 12/23/2018 11:56 AM

## 2018-12-23 NOTE — Telephone Encounter (Signed)
Please advise 

## 2018-12-23 NOTE — Progress Notes (Signed)
[place] for heparin Indication: pulmonary embolus  Allergies  Allergen Reactions  . Furadantin  [Nitrofurantoin]   . Phenobarbital Rash   Patient Measurements: Height: 5\' 6"  (167.6 cm) Weight: 190 lb 3.2 oz (86.3 kg) IBW/kg (Calculated) : 59.3 Heparin Dosing Weight:77.8 kg  Vital Signs: Temp: 98.7 F (37.1 C) (08/06 0313) Temp Source: Oral (08/06 0313) BP: 113/61 (08/06 0313) Pulse Rate: 101 (08/06 0313)  Labs: Recent Labs    12/20/18 0809  12/21/18 0132  12/22/18 0403 12/22/18 2124 12/23/18 0521  HGB  --    < > 12.4  --  12.0  --  12.2  HCT  --   --  37.4  --  36.6  --  37.2  PLT  --   --  191  --  205  --  225  APTT 33  --   --   --   --   --   --   LABPROT 13.6  --   --   --   --   --   --   INR 1.1  --   --   --   --   --   --   HEPARINUNFRC  --    < > 0.31   < > 0.21* 0.24* 0.44  CREATININE  --   --  0.48  --  0.48  --   --    < > = values in this interval not displayed.    Estimated Creatinine Clearance: 68.3 mL/min (by C-G formula based on SCr of 0.48 mg/dL).   Medical History: Past Medical History:  Diagnosis Date  . Allergy   . Cancer ([place]) 09/2014   Skin Cancer  . Hyperlipidemia   . Hypertension     Medications:  Scheduled:  . allopurinol  300 mg Oral Daily  . feeding supplement (ENSURE ENLIVE)  237 mL Oral BID BM  . fluticasone  2 spray Each Nare Daily  . folic acid  1 mg Oral Daily  . loratadine  10 mg Oral Daily  . mouth rinse  15 mL Mouth Rinse BID  . metoprolol succinate  25 mg Oral Daily  . montelukast  10 mg Oral Daily  . potassium chloride  20 mEq Oral BID  . sodium chloride flush  3 mL Intravenous Q12H    Assessment: Patient admitted for SOB s/t to lung mets that has possibly metastasized to liver. Patient has PE on CT and is going for liver biopsy this am at around 0930. Patient is to be started on heparin drip @ 0945 post-biopsy.  Heparin Course: 8/3 Heparin initiated at 1200  units/hr 8/3 @6 :10 HL 0.17, Heparin 2300 units bolus, increase rate to 1500 units/hr 8/4 @ 01:32 HL 0.31, Heparin drip increased to 1550 units/hr 8/4 @ 08:57 HL 0.45 8/4 @ 1733 HL 0.48  8/5 @ 0403 HL 0.21 8/5: Heparin was held today at 0930 then restarted at 1320 at 1700 units/hr.  Confirmed with nurse there have been on interruptions in infusion since restart.  HL low at 0.24 (at 2124)- Infusion increased to 1850 units/hr   Goal of Therapy:  Heparin level 0.3-0.7 units/ml Monitor platelets by anticoagulation protocol: Yes   Plan:  8/6 @ 0521 HL: 0.44. Level is therapeutic x 1. Continue heparin infusion rate of 1850 units/hr  Recheck confirmatory  HL in 6 hours, continue to monitor daily HL and CBC's and adjust per anti-Xa levels.  Pernell Dupre, PharmD, BCPS Clinical  Pharmacist 12/23/2018 7:21 AM

## 2018-12-23 NOTE — Progress Notes (Signed)
I spoke with patient and daughter by phone. They had several questions about how long it might take to receive results from the biopsy and care patient may require when she returns home. We discussed each of their questions in detail. I encouraged patient to write questions down as she thinks of them.   Patient does endorse some anxiety that is particularly bad at bedtime. She has taken alprazolam before with good effect. Will order.   Case discussed with Dr. Tasia Catchings and Dr. Vianne Bulls.   Time Total: 45 minutes  Visit consisted of counseling and education dealing with the complex and emotionally intense issues of symptom management and palliative care in the setting of serious and potentially life-threatening illness.Greater than 50%  of this time was spent counseling and coordinating care related to the above assessment and plan.  Signed by: Altha Harm, PhD, NP-C [phone] (Work Cell)

## 2018-12-23 NOTE — Progress Notes (Signed)
Pt. reports being short of breath and having bad dreams. O2 sat noted to be 98% on 4Lnc high flow. Respiratory called to assess pt. RT increased O2 to 6L and pt. instructed on breathing techniques to assist with respiratory status.  Pt. assisted back to bed. No other issues noted at this time.  Will continue to monitor.

## 2018-12-23 NOTE — Care Management Important Message (Signed)
Important Message  Patient Details  Name: Elizabeth [name] MRN: [id number] Date of Birth: [birth year]   Medicare Important Message Given:  Yes     Juliann Pulse A Harvey Lingo 12/23/2018, 11:27 AM

## 2018-12-23 NOTE — Progress Notes (Signed)
[place] at [place] NAME: Elizabeth [name]    MR#:  [id number]  DATE OF BIRTH:  Feb 21, 1946  SUBJECTIVE:  Patient had markings for palliative radiation therapy, still on 5 L oxygen, patient had bad dreams last night and feels very tired now.  On antibiotics for pneumonia, WBC is down.  Less cough, states morphine is helping.  REVIEW OF SYSTEMS:  CONSTITUTIONAL: No fever, feels fatigued, tired. EYES: No blurred or double vision.  EARS, NOSE, AND THROAT: No tinnitus or ear pain.  RESPIRATORY: Mild shortness of breath, cough.  CARDIOVASCULAR: No chest pain, orthopnea, edema.  GASTROINTESTINAL: No nausea, vomiting, diarrhea or abdominal pain.  GENITOURINARY: No dysuria, hematuria.  ENDOCRINE: No polyuria, nocturia HEMATOLOGY: No anemia, easy bruising or bleeding SKIN: No rash or lesion. MUSCULOSKELETAL: No joint pain or arthritis.   NEUROLOGIC: No tingling, numbness, weakness.  PSYCHIATRY: No anxiety or depression.   DRUG ALLERGIES:   Allergies  Allergen Reactions  . Furadantin  [Nitrofurantoin]   . Phenobarbital Rash    VITALS:  Blood pressure 113/61, pulse (!) 101, temperature 98.7 F (37.1 C), temperature source Oral, resp. rate 15, height 5' 6"  (1.676 m), weight 86.3 kg, SpO2 95 %.  PHYSICAL EXAMINATION:  GENERAL:  73 y.o.-year-old patient lying in the bed with no acute distress.   EYES: Pupils equal, round, reactive to light and accommodation. No scleral icterus. Extraocular muscles intact.  HEENT: Head atraumatic, normocephalic. Oropharynx and nasopharynx clear.  NECK:  Supple, no jugular venous distention. No thyroid enlargement, no tenderness.  LUNGS: + Diminished breath sounds in the lung bases bilaterally, no wheezing, rales,rhonchi or crepitation. No use of accessory muscles of respiration.  CARDIOVASCULAR: Tachycardic, regular rhythm, S1, S2 normal. No murmurs, rubs, or gallops.  ABDOMEN: Soft, nontender, nondistended.  Bowel sounds present.  EXTREMITIES: No pedal edema, cyanosis, or clubbing.  NEUROLOGIC: Cranial nerves II through XII are intact. Muscle strength 5/5 in all extremities. Sensation intact. Gait not checked.  PSYCHIATRIC: The patient is alert and oriented x 3.  SKIN: No obvious rash, lesion, or ulcer.    LABORATORY PANEL:   CBC Recent Labs  Lab 12/23/18 0521  WBC 13.5*  HGB 12.2  HCT 37.2  PLT 225   ------------------------------------------------------------------------------------------------------------------  Chemistries  Recent Labs  Lab 12/17/18 2035  12/22/18 0403  NA 137   < > 137  K 2.7*   < > 3.7  CL 102   < > 102  CO2 24   < > 25  GLUCOSE 134*   < > 102*  BUN 26*   < > 20  CREATININE 0.83   < > 0.48  CALCIUM 12.2*   < > 9.8  AST 74*  --   --   ALT 95*  --   --   ALKPHOS 153*  --   --   BILITOT 0.8  --   --    < > = values in this interval not displayed.   ------------------------------------------------------------------------------------------------------------------  Cardiac Enzymes No results for input(s): TROPONINI in the last 168 hours. ------------------------------------------------------------------------------------------------------------------  RADIOLOGY:  Ct Biopsy  Result Date: 12/22/2018 INDICATION: 73 year old with probable metastatic disease and needs a tissue diagnosis. Lytic lesion involving the posterior right ilium. EXAM: CT-GUIDED CORE BIOPSY OF RIGHT ILIAC BONE LESION MEDICATIONS: None. ANESTHESIA/SEDATION: Moderate (conscious) sedation was employed during this procedure. A total of Versed 1.0 mg and Fentanyl 50 mcg was administered intravenously. Moderate Sedation Time: 11 minutes. The patient's level of consciousness and vital  signs were monitored continuously by radiology nursing throughout the procedure under my direct supervision. FLUOROSCOPY TIME:  None COMPLICATIONS: None immediate. PROCEDURE: Informed written consent was obtained  from the patient after a thorough discussion of the procedural risks, benefits and alternatives. All questions were addressed. A timeout was performed prior to the initiation of the procedure. Patient was placed prone. CT images through the pelvis were obtained. The destructive lesion in the posterior right ilium was targeted for biopsy. The overlying skin was prepped with chlorhexidine and sterile field was created. Skin and soft tissues were anesthetized with 1% lidocaine. Using CT guidance, 17 gauge coaxial needle was easily directed through the cortex and into the soft tissue lesion within the bone. A total of 4 core biopsies were obtained with an 18 gauge device. Specimens placed in formalin. Needle was removed without complication. FINDINGS: Soft tissue lesions involving the right sacrum and the posterior right ilium. Needle was easily advanced into the right posterior iliac bone lesion. Four soft tissue core biopsies were obtained. IMPRESSION: CT-guided core biopsies of the posterior right iliac bone lesion. Electronically Signed   By: Markus Daft M.D.   On: 12/22/2018 11:55    EKG:   Orders placed or performed during the hospital encounter of 12/17/18  . EKG 12-Lead  . EKG 12-Lead    ASSESSMENT AND PLAN:    Acute hypoxic respiratory failure- secondary to right lower lobe lung mass and multifocal PE.  Improving.  Patient is down to 4 to 5 L of oxygen. -CTA chest was positive for multifocal PE -Continue heparin gtt -ECHO without evidence of right heart strain -DuoNebs PRN -Continue supplemental oxygen, wean as able  Left lower extremity DVT- seen on duplex US. -Continue heparin gtt, plan to wean off the oxygen and then changed to oral anticoagulant if okay with oncology.  New right lower lobe 6 cm lung mass with central necrosis- concerning for primary pulmonary malignancy with metastases to lymph nodes, liver, bone. -Oncology following, patient had markings for radiation therapy  today. Status post bone marrow biopsy yesterday.   acute PE, possible superimposed pneumonia with hypoxia, elevated white count, fever, elevated procalcitonin,  started IV antibiotics yesterday, WBC is down today Hypercalcemia- improving, likely secondary to malignancy. -s/p IV Zometa x 1  Hypertension-tachycardia, increase beta-blocker due to tachycardia, better today.  Plan for discharge when able to wean off oxygen, patient is eager to go home as soon as possible and start therapy for her cancer.  all the records are reviewed and case discussed with Care Management/Social Workerr. Management plans discussed with the patient,she is  in agreement.  Patient said that she would update to the family members and requesting not to talk to her family members at this point of time  CODE STATUS: Full code  TOTAL TIME TAKING CARE OF THIS PATIENT: 40 minutes.   POSSIBLE D/C IN 3-4 DAYS, DEPENDING ON CLINICAL CONDITION.  Note: This dictation was prepared with Dragon dictation along with smaller phrase technology. Any transcriptional errors that result from this process are unintentional.   Epifanio Lesches M.D on 12/23/2018 at 1:23 PM  Between 7am to 6pm - Pager - [phone]  After 6pm go to [email or web address] - password [email or web address] [facility]  Office  [phone]  CC: Primary care physician; Mar Daring, PA-C

## 2018-12-24 ENCOUNTER — Other Ambulatory Visit: Payer: Self-pay | Admitting: *Deleted

## 2018-12-24 ENCOUNTER — Other Ambulatory Visit: Payer: Self-pay | Admitting: Anatomic Pathology & Clinical Pathology

## 2018-12-24 ENCOUNTER — Encounter: Payer: Self-pay | Admitting: *Deleted

## 2018-12-24 DIAGNOSIS — C7951 Secondary malignant neoplasm of bone: Secondary | ICD-10-CM

## 2018-12-24 DIAGNOSIS — C3491 Malignant neoplasm of unspecified part of right bronchus or lung: Secondary | ICD-10-CM

## 2018-12-24 LAB — CBC
HCT: 35.9 % — ABNORMAL LOW (ref 36.0–46.0)
Hemoglobin: 11.6 g/dL — ABNORMAL LOW (ref 12.0–15.0)
MCH: 30.8 pg (ref 26.0–34.0)
MCHC: 32.3 g/dL (ref 30.0–36.0)
MCV: 95.2 fL (ref 80.0–100.0)
Platelets: 275 10*3/uL (ref 150–400)
RBC: 3.77 MIL/uL — ABNORMAL LOW (ref 3.87–5.11)
RDW: 13.1 % (ref 11.5–15.5)
WBC: 13.9 10*3/uL — ABNORMAL HIGH (ref 4.0–10.5)
nRBC: 0 % (ref 0.0–0.2)

## 2018-12-24 LAB — HEPARIN LEVEL (UNFRACTIONATED)
Heparin Unfractionated: 0.28 IU/mL — ABNORMAL LOW (ref 0.30–0.70)
Heparin Unfractionated: 0.27 IU/mL — ABNORMAL LOW (ref 0.30–0.70)

## 2018-12-24 LAB — SURGICAL PATHOLOGY

## 2018-12-24 MED ORDER — APIXABAN 5 MG PO TABS: 10.0000 mg | ORAL_TABLET | Freq: Two times a day (BID) | ORAL | Status: DC

## 2018-12-24 MED ORDER — APIXABAN 5 MG PO TABS
10.0000 mg | ORAL_TABLET | Freq: Two times a day (BID) | ORAL | Status: DC
  Administered 2018-12-24 – 2018-12-26 (×5): 10 mg via ORAL
  Filled 2018-12-24 (×5): qty 2

## 2018-12-24 MED ORDER — AZITHROMYCIN 500 MG PO TABS
500.0000 mg | ORAL_TABLET | Freq: Every day | ORAL | Status: AC
  Administered 2018-12-24 – 2018-12-26 (×3): 500 mg via ORAL
  Filled 2018-12-24 (×3): qty 1

## 2018-12-24 MED ORDER — APIXABAN 5 MG PO TABS: 5.0000 mg | ORAL_TABLET | Freq: Two times a day (BID) | ORAL | Status: DC

## 2018-12-24 MED ORDER — HEPARIN BOLUS VIA INFUSION
1100.0000 [IU] | Freq: Once | INTRAVENOUS | Status: AC
  Administered 2018-12-24: 1100 [IU] via INTRAVENOUS
  Filled 2018-12-24: qty 1100

## 2018-12-24 NOTE — Progress Notes (Signed)
[place] for heparin Indication: pulmonary embolus  Allergies  Allergen Reactions  . Furadantin  [Nitrofurantoin]   . Phenobarbital Rash   Patient Measurements: Height: 5\' 6"  (167.6 cm) Weight: 190 lb 3.2 oz (86.3 kg) IBW/kg (Calculated) : 59.3 Heparin Dosing Weight:77.8 kg  Vital Signs: Temp: 98.9 F (37.2 C) (08/07 0446) BP: 150/81 (08/07 1050) Pulse Rate: 110 (08/07 1050)  Labs: Recent Labs    12/22/18 0403  12/23/18 0521 12/23/18 1119 12/24/18 0550  HGB 12.0  --  12.2  --  11.6*  HCT 36.6  --  37.2  --  35.9*  PLT 205  --  225  --  275  HEPARINUNFRC 0.21*   < > 0.44 0.42 0.28*  CREATININE 0.48  --   --   --   --    < > = values in this interval not displayed.    Estimated Creatinine Clearance: 68.3 mL/min (by C-G formula based on SCr of 0.48 mg/dL).  Medical History: Past Medical History:  Diagnosis Date  . Allergy   . Cancer ([place]) 09/2014   Skin Cancer  . Hyperlipidemia   . Hypertension    Medications:  Scheduled:  . allopurinol  300 mg Oral Daily  . azithromycin  500 mg Oral Daily  . feeding supplement (ENSURE ENLIVE)  237 mL Oral BID BM  . fluticasone  2 spray Each Nare Daily  . folic acid  1 mg Oral Daily  . gabapentin  300 mg Oral TID  . loratadine  10 mg Oral Daily  . mouth rinse  15 mL Mouth Rinse BID  . metoprolol succinate  50 mg Oral Daily  . montelukast  10 mg Oral Daily  . potassium chloride  20 mEq Oral BID  . sodium chloride flush  3 mL Intravenous Q12H    Assessment: Patient admitted for SOB s/t to lung mets that has possibly metastasized to liver. Patient has PE on CT and is going for liver biopsy this am at around 0930. Patient is to be started on heparin drip @ 0945 post-biopsy.  Heparin Course: 8/3 Heparin initiated at 1200 units/hr 8/3 @6 :10 HL 0.17, Heparin 2300 units bolus, increase rate to 1500 units/hr 8/4 @ 01:32 HL 0.31, Heparin drip increased to 1550 units/hr 8/4 @ 08:57 HL  0.45 8/4 @ 1733 HL 0.48  8/5 @ 0403 HL 0.21 8/5: Heparin was held today at 0930 then restarted at 1320 at 1700 units/hr.  Confirmed with nurse there have been on interruptions in infusion since restart.  HL low at 0.24 (at 2124)- Infusion increased to 1850 units/hr   8/6 @ 0521 HL: 0.44. Level is therapeutic x 1.  Continued heparin infusion rate of 1850 units/hr   8/6 @ 1119 HL: 0.42 Level Therapeutic x 2  Continued 1850 units/hr  8/7 @ 0550 HL 0.28  Level is subtherapeutic -rebolused with Heparin 1100 units x 1 and increase heparin rate to 2000 units/hr  8/7 @ 1310 HL 0.27 Level is subtherapeutic  Goal of Therapy:  Heparin level 0.3-0.7 units/ml Monitor platelets by anticoagulation protocol: Yes   Plan:   #Verified with Tiffany (RN) no interruptions in infusion#   - will increase rate to 2100 units/hr    Will recheck HL in 8 hours and monitor CBC's daily per protocol.  Lu Duffel, PharmD, BCPS Clinical Pharmacist 12/24/2018 1:50 PM

## 2018-12-24 NOTE — Progress Notes (Addendum)
Hematology/Oncology Follow Up Note [facility]  [phone]) [phone] [phone]  Patient Care Team: Rubye Beach as PCP - General (Family Medicine) Dasher, Rayvon Char, MD as Consulting Physician (Dermatology) Yolonda Kida, MD as Consulting Physician (Cardiology) Dingeldein, Remo Lipps, MD as Consulting Physician (Ophthalmology) Kipp Laurence, MD as Referring Physician (Audiology) Grant Fontana, DC as Referring Physician (Chiropractic Medicine) Watt Climes, PA as Physician Assistant (Physician Assistant) Telford Nab, RN as Registered Nurse   Name of the patient: Elizabeth [name]  [id number]  [birth year]   INTERVAL HISTORY Patient is lying in bed, breathing comfortably on 4 L of high flow oxygen. Husband Eulas Post is at bedside. Patient reports breathing is slightly better, less coughing spells. Still shortness of breath with exertion. On heparin drip.  Denies any bleeding events. She feels the morphine has been helping with her pain.  Review of Systems  Constitutional: Positive for appetite change, fatigue and unexpected weight change.  Respiratory: Positive for shortness of breath. Negative for cough.   Cardiovascular: Negative for chest pain.  Gastrointestinal: Negative for abdominal distention and abdominal pain.  Genitourinary: Negative for frequency and hematuria.   Musculoskeletal: Positive for back pain.  Skin: Negative for rash.  Neurological: Negative for dizziness.    Allergies  Allergen Reactions   Furadantin  [Nitrofurantoin]    Phenobarbital Rash     Past Medical History:  Diagnosis Date   Allergy    Cancer ([place]) 09/2014   Skin Cancer   Hyperlipidemia    Hypertension      Past Surgical History:  Procedure Laterality Date   ABDOMINAL HYSTERECTOMY  1989   due to menorrhagia   COLONOSCOPY     [place]   SKIN CANCER EXCISION  10/2014   on chest; Dr. Evorn Gong    TRIGGER FINGER RELEASE Right 2000   WRIST ARTHROPLASTY Left 1991    Social History   Socioeconomic History   Marital status: Married    Spouse name: Eulas Post   Number of children: 2   Years of education: Not on file   Highest education level: Bachelor's degree (e.g., BA, AB, BS)  Occupational History   Occupation: retired  Scientist, product/process development strain: Not hard at [employer]    Worry: Never true    Inability: Never true   Transportation needs    Medical: No    Non-medical: No  Tobacco Use   Smoking status: Former Smoker    Packs/day: 0.50    Years: 35.00    Pack years: 17.50    Quit date: 05/20/2003    Years since quitting: 15.6   Smokeless tobacco: Never Used  Substance and Sexual Activity   Alcohol use: Yes    Alcohol/week: 14.0 standard drinks    Types: 14 Glasses of wine per week    Comment: occasional; 12/16/18 - states no alcohol since 12/02/18    Drug use: No   Sexual activity: Not on file  Lifestyle   Physical activity    Days per week: 0 days    Minutes per session: 0 min   Stress: Not at all  Relationships   Social connections    Talks on phone: Patient refused    Gets together: Patient refused    Attends religious service: Patient refused    Active member of club or organization: Patient refused    Attends meetings of clubs or organizations: Patient refused    Relationship  status: Patient refused   Intimate partner violence    Fear of current or ex partner: Patient refused    Emotionally abused: Patient refused    Physically abused: Patient refused    Forced sexual activity: Patient refused  Other Topics Concern   Not on file  Social History Narrative   Not on file    Family History  Problem Relation Age of Onset   Arthritis Mother    Cerebrovascular Disease Mother    Heart disease Mother    Vascular Disease Mother    Hypertension Father    COPD Father    Throat cancer Father    Melanoma  Father    Prostate cancer Father    Skin cancer Brother    Skin cancer Brother    Breast cancer Paternal Aunt 55     Current Facility-Administered Medications:    0.9 %  sodium chloride infusion, 250 mL, Intravenous, PRN, Seals, Theo Dills, NP, Last Rate: 10 mL/hr at 12/24/18 1437, 250 mL at 12/24/18 1437   acetaminophen (TYLENOL) tablet 650 mg, 650 mg, Oral, Q6H PRN, 650 mg at 12/20/18 0358 **OR** acetaminophen (TYLENOL) suppository 650 mg, 650 mg, Rectal, Q6H PRN, Seals, Theo Dills, NP   allopurinol (ZYLOPRIM) tablet 300 mg, 300 mg, Oral, Daily, Earlie Server, MD, 300 mg at 12/24/18 1057   ALPRAZolam (XANAX) tablet 0.25 mg, 0.25 mg, Oral, QHS PRN, Borders, Kirt Boys, NP, 0.25 mg at 12/23/18 2227   azithromycin (ZITHROMAX) tablet 500 mg, 500 mg, Oral, Daily, Epifanio Lesches, MD   cefTRIAXone (ROCEPHIN) 1 g in sodium chloride 0.9 % 100 mL IVPB, 1 g, Intravenous, Q24H, Epifanio Lesches, MD, Last Rate: 200 mL/hr at 12/24/18 1437, 1 g at 12/24/18 1437   feeding supplement (ENSURE ENLIVE) (ENSURE ENLIVE) liquid 237 mL, 237 mL, Oral, BID BM, Gouru, Aruna, MD, 237 mL at 12/22/18 1231   fluticasone (FLONASE) 50 MCG/ACT nasal spray 2 spray, 2 spray, Each Nare, Daily, Seals, Theo Dills, NP, 2 spray at 21/19/41 7408   folic acid (FOLVITE) tablet 1 mg, 1 mg, Oral, Daily, Seals, Angela H, NP, 1 mg at 12/24/18 1057   gabapentin (NEURONTIN) capsule 300 mg, 300 mg, Oral, TID, Lance Coon, MD, 300 mg at 12/24/18 1057   guaiFENesin-dextromethorphan (ROBITUSSIN DM) 100-10 MG/5ML syrup 5 mL, 5 mL, Oral, Q4H PRN, Gouru, Aruna, MD, 5 mL at 12/24/18 1325   heparin ADULT infusion 100 units/mL (25000 units/275mL sodium chloride 0.45%), 2,100 Units/hr, Intravenous, Continuous, Shanlever, Pierce Crane, RPH, Last Rate: 21 mL/hr at 12/24/18 1401, 2,100 Units/hr at 12/24/18 1401   levalbuterol (XOPENEX) nebulizer solution 0.63 mg, 0.63 mg, Nebulization, Q6H PRN, Gouru, Aruna, MD, 0.63 mg at 12/24/18 1120    loratadine (CLARITIN) tablet 10 mg, 10 mg, Oral, Daily, Seals, Angela H, NP, 10 mg at 12/24/18 1057   MEDLINE mouth rinse, 15 mL, Mouth Rinse, BID, Seals, Angela H, NP, 15 mL at 12/24/18 1059   metoprolol succinate (TOPROL-XL) 24 hr tablet 50 mg, 50 mg, Oral, Daily, Epifanio Lesches, MD, 50 mg at 12/24/18 1057   metoprolol tartrate (LOPRESSOR) injection 5 mg, 5 mg, Intravenous, Q6H PRN, Gouru, Aruna, MD, 5 mg at 12/24/18 0313   montelukast (SINGULAIR) tablet 10 mg, 10 mg, Oral, Daily, Seals, Angela H, NP, 10 mg at 12/24/18 1057   morphine 2 MG/ML injection 1 mg, 1 mg, Intravenous, Q4H PRN, Mansy, Jan A, MD, 1 mg at 12/23/18 2139   morphine 2 MG/ML injection 2 mg, 2 mg, Intravenous, Q4H PRN, Mansy, Jan A,  MD, 2 mg at 12/23/18 1500   ondansetron (ZOFRAN) tablet 4 mg, 4 mg, Oral, Q6H PRN **OR** ondansetron (ZOFRAN) injection 4 mg, 4 mg, Intravenous, Q6H PRN, Seals, Theo Dills, NP   polyethylene glycol (MIRALAX / GLYCOLAX) packet 17 g, 17 g, Oral, Daily PRN, Seals, Angela H, NP   potassium chloride (KLOR-CON) packet 20 mEq, 20 mEq, Oral, BID, Seals, Angela H, NP, 20 mEq at 12/24/18 1057   sodium chloride flush (NS) 0.9 % injection 3 mL, 3 mL, Intravenous, Q12H, Seals, Angela H, NP, 3 mL at 12/24/18 1059   sodium chloride flush (NS) 0.9 % injection 3 mL, 3 mL, Intravenous, PRN, Seals, Angela H, NP, 3 mL at 12/23/18 2139   tiZANidine (ZANAFLEX) tablet 4 mg, 4 mg, Oral, TID PRN, Fritzi Mandes, MD, 4 mg at 12/22/18 2322  Physical exam:  Vitals:   12/23/18 2008 12/24/18 0446 12/24/18 1050 12/24/18 1120  BP: 124/72 (!) 140/59 (!) 150/81   Pulse: (!) 115 (!) 112 (!) 110   Resp:  18 20   Temp:  98.9 F (37.2 C)    TempSrc:      SpO2: 97% 93% 96% 95%  Weight:      Height:        GENERAL: Not in acute distress. SKIN:  No rashes or significant lesions  HEAD: Normocephalic, No masses, lesions, tenderness or abnormalities  EYES: Conjunctiva are pink, non icteric ENT: External ears normal  ,lips , buccal mucosa, and tongue normal and mucous membranes are moist  LYMPH: No palpable lymphadenopathy  LUNGS: No wheezing.  Decreased breath sound bilaterally.  Breathing via nasal cannula oxygen. HEART: Tachycardia ABDOMEN: Abdomen soft, non-tender, normal bowel sounds, MUSCULOSKELETAL: No CVA tenderness and no tenderness on percussion of the back or rib cage.  EXTREMITIES: No edema, no skin discoloration or tenderness NEURO: Alert and orientated., no focal motor/sensory deficits.      CMP Latest Ref Rng & Units 12/22/2018  Glucose 70 - 99 mg/dL 102(H)  BUN 8 - 23 mg/dL 20  Creatinine 0.44 - 1.00 mg/dL 0.48  Sodium 135 - 145 mmol/L 137  Potassium 3.5 - 5.1 mmol/L 3.7  Chloride 98 - 111 mmol/L 102  CO2 22 - 32 mmol/L 25  Calcium 8.9 - 10.3 mg/dL 9.8  Total Protein 6.5 - 8.1 g/dL -  Total Bilirubin 0.3 - 1.2 mg/dL -  Alkaline Phos 38 - 126 U/L -  AST 15 - 41 U/L -  ALT 0 - 44 U/L -   CBC Latest Ref Rng & Units 12/24/2018  WBC 4.0 - 10.5 K/uL 13.9(H)  Hemoglobin 12.0 - 15.0 g/dL 11.6(L)  Hematocrit 36.0 - 46.0 % 35.9(L)  Platelets 150 - 400 K/uL 275   RADIOGRAPHIC STUDIES: I have personally reviewed the radiological images as listed and agreed with the findings in the report.  Dg Chest 2 View  Result Date: 12/17/2018 CLINICAL DATA:  History of cancer. Former smoker. Shortness of breath. EXAM: CHEST - 2 VIEW COMPARISON:  03/20/2016.  06/28/2012. FINDINGS: Right hilar fullness. Diffuse bilateral interstitial prominence. Multiple bilateral pulmonary nodular densities. These findings suggest malignancy with possible right hilar adenopathy, interstitial tumor spread, and metastatic disease. An inflammatory or infectious process including active granulomatous disease could also present in this fashion. Contrast-enhanced chest CT should be considered for further evaluation. No pleural effusion or pneumothorax. Heart size normal. Thoracic spine scoliosis. IMPRESSION: Right hilar  fullness, diffuse bilateral interstitial prominence, and multiple bilateral pulmonary nodular densities. These findings suggest malignancy with possible right  hilar adenopathy, interstitial tumor spread, and metastatic disease. An inflammatory or infectious process including active granulomatous disease could also present this fashion. Contrast-enhanced chest CT should be considered for further evaluation. Electronically Signed   By: Marcello Moores  Register   On: 12/17/2018 08:14   Ct Chest W Contrast  Result Date: 12/17/2018 CLINICAL DATA:  Follow-up exam. History of metastasis and lung nodules. EXAM: CT CHEST, ABDOMEN, AND PELVIS WITH CONTRAST TECHNIQUE: Multidetector CT imaging of the chest, abdomen and pelvis was performed following the standard protocol during bolus administration of intravenous contrast. CONTRAST:  156mL OMNIPAQUE IOHEXOL 300 MG/ML  SOLN COMPARISON:  Chest radiograph 12/16/2018; MRI lumbar spine 12/02/2018 FINDINGS: CT CHEST FINDINGS Cardiovascular: Normal heart size. Trace fluid superior pericardial recess. Thoracic aortic vascular calcifications. Mediastinum/Nodes: No axillary lymphadenopathy. There is a 1.9 cm centrally necrotic precarinal node (image 22; series 2). There is a 1.7 cm right hilar node (image 27; series 2). There is a 1.9 cm left hilar node (image 30; series 2). There is a 1.2 cm centrally necrotic nodule along the posterior left pericardium (image 38; series 2). Mild wall thickening of the esophagus. Lungs/Pleura: Central airways are patent. There is a dominant centrally necrotic 6.2 x 5.9 cm mass within the right lower lobe (image 68; series 4). There is a 2.9 cm subpleural left lower lobe nodule (image 138; series 4). There are multiple bilateral pulmonary nodules. Reference 1.7 cm left lower lobe nodule (image 115; series 4). Reference 0.8 cm left upper lobe nodule (image 42; series 4). Reference 1.5 cm right middle lobe nodule (image 66; series 4). 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 There is a moderate right pleural effusion. Musculoskeletal: No aggressive or acute appearing osseous lesions. CT ABDOMEN PELVIS FINDINGS Hepatobiliary: There are innumerable low-attenuation lesions throughout the left and right hepatic lobes, the majority are subcentimeter in size. Gallbladder is unremarkable. No intrahepatic or extrahepatic biliary ductal dilatation. Pancreas: Unremarkable Spleen: Unremarkable Adrenals/Urinary Tract: Normal adrenal glands. Kidneys enhance symmetrically with contrast. Subcentimeter too small to characterize low-attenuation lesion inferior pole left kidney (image 77; series 2). Urinary bladder is decompressed. Stomach/Bowel: Stool throughout the colon. No abnormal bowel wall thickening or evidence for bowel obstruction. No free fluid or free intraperitoneal air. Normal morphology of the stomach. Vascular/Lymphatic: Normal caliber abdominal aorta. Peripheral calcified atherosclerotic plaque. 1.3 cm left periaortic lymph node (image 71; series 2). 1.1 cm aortocaval node (image 80; series 2). There is a 1.0 cm porta hepatic node (image 65; series 2). Reproductive: Prior hysterectomy. Adnexal structures are unremarkable. Other: None. Musculoskeletal: Lumbar spine degenerative changes. There is a 6.7 x 5.7 cm lytic soft tissue mass involving the right hemi sacrum (image 93; series 2) with soft tissue involving the sacral spinal canal. There is a 2.4 cm soft tissue lytic lesion within the posterior right ilium (image 92; series 2). Additional lytic lesions within the right ilium. IMPRESSION: 1. Large centrally necrotic right lower lobe mass concerning the possibility of primary pulmonary malignancy. 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 2. Multiple enlarged and necrotic mediastinal and hilar nodes most compatible with nodal metastasis. 3. Enlarged retroperitoneal nodes compatible with metastasis. 4. Innumerable low-attenuation lesions within liver compatible metastasis. 5. Large lytic mass involving the  right hemi sacrum as well as smaller lytic lesions involving the right ilium consistent with osseous metastasis. Electronically Signed   By: Lovey Newcomer M.D.   On: 12/17/2018 16:47   Ct Angio Chest Pe W Or Wo Contrast  Result Date: 12/20/2018 CLINICAL DATA:  Acute onset of tachycardia and shortness of breath. Recent diagnosis lung cancer EXAM: CT ANGIOGRAPHY CHEST WITH CONTRAST TECHNIQUE: Multidetector CT imaging of the chest was performed using the standard protocol during bolus administration of intravenous contrast. Multiplanar CT image reconstructions and MIPs were obtained to evaluate the vascular anatomy. CONTRAST:  30mL OMNIPAQUE IOHEXOL 350 MG/ML SOLN COMPARISON:  12/17/2018 FINDINGS: Cardiovascular: Normal heart size. Trace pericardial fluid or thickening-stable. Possible hypertrophic left ventricle. Multiple new branching pulmonary artery filling defects that are segmental to subsegmental and demarcated on series 5. The most proximal filling defects are seen and segmental right upper lobe branches Mediastinum/Nodes: Known low-density adenopathy in the mediastinum better depicted on parenchymal phase CT performed previously. Lungs/Pleura: Known right lower lobe mass with central low-density and pulmonary metastases on both sides. 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 Small right pleural effusion. Upper Abdomen: Extensive hepatic metastatic disease. Musculoskeletal: No acute finding. Please see Z vision log concerning efforts to reach on-call physician. Overhead paging is  disallowed. Critical Value/emergent results were called by telephone at the time of interpretation on 12/20/2018 at 6:51 am to Dr. Sidney Ace, who verbally acknowledged these results. Review of the MIP images confirms the above findings. IMPRESSION: 1. Positive for multifocal segmental and subsegmental acute pulmonary emboli.No indication of right heart strain. 2. Extensive thoracic and intra-abdominal malignancy as recently staged. Electronically Signed   By: Monte Fantasia M.D.   On: 12/20/2018 06:51   Mr Jeri Cos TD Contrast  Result Date: 12/21/2018 CLINICAL DATA:  Non-small-cell lung cancer staging EXAM: MRI HEAD WITHOUT AND WITH CONTRAST TECHNIQUE: Multiplanar, multiecho pulse sequences of the brain and surrounding structures were obtained without and with intravenous contrast. CONTRAST:  8 mL Gadovist IV COMPARISON:  None. FINDINGS: Brain: Image quality degraded by motion especially the postcontrast images. Multiple enhancing lesions the brain compatible with metastatic disease. Axial images significantly degraded by motion. Coronal and sagittal images have less motion. 8 mm enhancing lesion right occipital lobe 6 mm enhancing lesion right posterior temporal lobe above the tentorium 9 mm lesion right parietal cortex 3 mm lesion right parietal lobe 5 mm left inferior temporal lobe 5 mm right frontal lesion 5 mm left frontal lesion. No significant brain edema or midline shift. No acute infarct. Negative for hemorrhage Vascular: Normal arterial flow voids Skull and upper cervical spine: No focal skeletal lesion. 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 All less than 1 cm. No significant edema. Image quality degraded by motion which could limit detection of small lesions. Electronically Signed   By: Franchot Gallo M.D.   On: 12/21/2018 11:59   Mr Lumbar Spine Wo Contrast  Result Date: 12/03/2018 CLINICAL DATA:  Sciatica right side. Right back and leg pain 4-5  months. EXAM: MRI LUMBAR SPINE WITHOUT CONTRAST TECHNIQUE: Multiplanar, multisequence MR imaging of the lumbar spine was performed. No intravenous contrast was administered. COMPARISON:  None. FINDINGS: Segmentation:  Normal Alignment:  4 mm anterolisthesis L4-5.  Mild dextroscoliosis. Vertebrae: 1 cm hyperintense lesion in the left posterior T12 vertebral body best seen on inversion recovery. Infiltrating mass lesion in the right sacrum involving the right sacrum. Tumor involves the right S1 vertebral body and pedicle and  extends into the right S2 vertebral body. Tumor is low signal on T1 and hyperintense on T2. Extraosseous tumor with narrowing the foramen at L5-S1 and S1-2. Tumor also infiltrates the posterior iliac bone adjacent to the SI joint. Small right SI joint effusion. Conus medullaris and cauda equina: Conus extends to the L1-2 level. Conus and cauda equina appear normal. Paraspinal and other soft tissues: Negative for retroperitoneal adenopathy. Disc levels: L1-2: Mild disc and facet degeneration without stenosis L2-3: Moderate disc degeneration with disc bulging and spurring. Bilateral facet hypertrophy and mild spinal stenosis. L3-4: Disc degeneration and spondylosis. Bilateral facet hypertrophy. Moderate spinal stenosis. Mild to moderate subarticular stenosis bilaterally L4-5: 4 mm anterolisthesis. Diffuse bulging of the disc with moderate to severe facet degeneration. Moderate to severe spinal stenosis. Mild subarticular stenosis bilaterally L5-S1: Bilateral facet hypertrophy. Right foraminal encroachment due to tumor extension and spurring. 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 Tumor also infiltrates right posterior iliac bone. 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 Right L5 and S1 foraminal encroachment due to tumor extension into the soft tissues. Small lesion T12 also supportive of metastatic disease.  Chondrosarcoma or lymphoma right sacrum less likely. These results were called by telephone at the time of interpretation on 12/03/2018 at 10:27 am to [facility] , who verbally acknowledged these results. Electronically Signed   By: Franchot Gallo M.D.   On: 12/03/2018 10:36   Ct Abdomen Pelvis W Contrast  Result Date: 12/17/2018 CLINICAL DATA:  Follow-up exam. History of metastasis and lung nodules. EXAM: CT CHEST, ABDOMEN, AND PELVIS WITH CONTRAST TECHNIQUE: Multidetector CT imaging of the chest, abdomen and pelvis was performed following the standard protocol during bolus administration of intravenous contrast. CONTRAST:  128mL OMNIPAQUE IOHEXOL 300 MG/ML  SOLN COMPARISON:  Chest radiograph 12/16/2018; MRI lumbar spine 12/02/2018 FINDINGS: CT CHEST FINDINGS Cardiovascular: Normal heart size. Trace fluid superior pericardial recess. Thoracic aortic vascular calcifications. Mediastinum/Nodes: No axillary lymphadenopathy. There is a 1.9 cm centrally necrotic precarinal node (image 22; series 2). There is a 1.7 cm right hilar node (image 27; series 2). There is a 1.9 cm left hilar node (image 30; series 2). There is a 1.2 cm centrally necrotic nodule along the posterior left pericardium (image 38; series 2). Mild wall thickening of the esophagus. Lungs/Pleura: Central airways are patent. There is a dominant centrally necrotic 6.2 x 5.9 cm mass within the right lower lobe (image 68; series 4). There is a 2.9 cm subpleural left lower lobe nodule (image 138; series 4). There are multiple bilateral pulmonary nodules. Reference 1.7 cm left lower lobe nodule (image 115; series 4). Reference 0.8 cm left upper lobe nodule (image 42; series 4). Reference 1.5 cm right middle lobe nodule (image 66; series 4). Within the right lung involving the right upper, right middle and right lower lobes as well as within the central left upper and left lower lobes there is nodular interlobular septal thickening concerning for  lymphangitic carcinomatosis. There is a moderate right pleural effusion. Musculoskeletal: No aggressive or acute appearing osseous lesions. CT ABDOMEN PELVIS FINDINGS Hepatobiliary: There are innumerable low-attenuation lesions throughout the left and right hepatic lobes, the majority are subcentimeter in size. Gallbladder is unremarkable. No intrahepatic or extrahepatic biliary ductal dilatation. Pancreas: Unremarkable Spleen: Unremarkable Adrenals/Urinary Tract: Normal adrenal glands. Kidneys enhance symmetrically with contrast. Subcentimeter too small to characterize low-attenuation lesion inferior pole left kidney (image 77; series 2). Urinary bladder is decompressed. Stomach/Bowel: Stool throughout the colon.  No abnormal bowel wall thickening or evidence for bowel obstruction. No free fluid or free intraperitoneal air. Normal morphology of the stomach. Vascular/Lymphatic: Normal caliber abdominal aorta. Peripheral calcified atherosclerotic plaque. 1.3 cm left periaortic lymph node (image 71; series 2). 1.1 cm aortocaval node (image 80; series 2). There is a 1.0 cm porta hepatic node (image 65; series 2). Reproductive: Prior hysterectomy. Adnexal structures are unremarkable. Other: None. Musculoskeletal: Lumbar spine degenerative changes. There is a 6.7 x 5.7 cm lytic soft tissue mass involving the right hemi sacrum (image 93; series 2) with soft tissue involving the sacral spinal canal. There is a 2.4 cm soft tissue lytic lesion within the posterior right ilium (image 92; series 2). Additional lytic lesions within the right ilium. IMPRESSION: 1. Large centrally necrotic right lower lobe mass concerning the possibility of primary pulmonary malignancy. 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 2. Multiple enlarged and necrotic mediastinal and hilar nodes most  compatible with nodal metastasis. 3. Enlarged retroperitoneal nodes compatible with metastasis. 4. Innumerable low-attenuation lesions within liver compatible metastasis. 5. Large lytic mass involving the right hemi sacrum as well as smaller lytic lesions involving the right ilium consistent with osseous metastasis. Electronically Signed   By: Lovey Newcomer M.D.   On: 12/17/2018 16:47   US Venous Img Lower Bilateral  Result Date: 12/20/2018 CLINICAL DATA:  Metastatic cancer, pulmonary emboli, anticoagulated EXAM: BILATERAL LOWER EXTREMITY VENOUS DOPPLER ULTRASOUND TECHNIQUE: Gray-scale sonography with graded compression, as well as color Doppler and duplex ultrasound were performed to evaluate the lower extremity deep venous systems from the level of the common femoral vein and including the common femoral, femoral, profunda femoral, popliteal and calf veins including the posterior tibial, peroneal and gastrocnemius veins when visible. The superficial great saphenous vein was also interrogated. Spectral Doppler was utilized to evaluate flow at rest and with distal augmentation maneuvers in the common femoral, femoral and popliteal veins. COMPARISON:  None. FINDINGS: RIGHT LOWER EXTREMITY Common Femoral Vein: No evidence of thrombus. Normal compressibility, respiratory phasicity and response to augmentation. Saphenofemoral Junction: No evidence of thrombus. Normal compressibility and flow on color Doppler imaging. Profunda Femoral Vein: No evidence of thrombus. Normal compressibility and flow on color Doppler imaging. Femoral Vein: No evidence of thrombus. Normal compressibility, respiratory phasicity and response to augmentation. Popliteal Vein: No evidence of thrombus. Normal compressibility, respiratory phasicity and response to augmentation. Calf Veins: No evidence of thrombus. Normal compressibility and flow on color Doppler imaging. Superficial Great Saphenous Vein: No evidence of thrombus. Normal  compressibility. Venous Reflux:  Not assessed Other Findings:  None. LEFT LOWER EXTREMITY Common Femoral Vein: No evidence of thrombus. Normal compressibility, respiratory phasicity and response to augmentation. Saphenofemoral Junction: No evidence of thrombus. Normal compressibility and flow on color Doppler imaging. Profunda Femoral Vein: No evidence of thrombus. Normal compressibility and flow on color Doppler imaging. Femoral Vein: No evidence of thrombus. Normal compressibility, respiratory phasicity and response to augmentation. Popliteal Vein: No evidence of thrombus. Normal compressibility, respiratory phasicity and response to augmentation. Calf Veins: Left posterior tibial calf veins demonstrate intraluminal hypoechoic thrombus over a short segment. Vessel is noncompressible. Very low thrombus burden. Other calf veins appear patent. Superficial Great Saphenous Vein: No evidence of thrombus. Normal compressibility. Left small saphenous vein also demonstrates hypoechoic thrombus. Vessel is noncompressible. Very low thrombus burden. Findings compatible with left small saphenous vein superficial thrombosis. Venous Reflux:  Not assessed  Other Findings:  None. IMPRESSION: Negative for right lower extremity DVT. Positive for left calf posterior tibial DVT. Very low thrombus burden. No propagation into the popliteal or femoral veins. Associated left small saphenous vein superficial thrombosis/thrombophlebitis. Electronically Signed   By: Jerilynn Mages.  Shick M.D.   On: 12/20/2018 15:49   Ct Biopsy  Result Date: 12/22/2018 INDICATION: 73 year old with probable metastatic disease and needs a tissue diagnosis. Lytic lesion involving the posterior right ilium. EXAM: CT-GUIDED CORE BIOPSY OF RIGHT ILIAC BONE LESION MEDICATIONS: None. ANESTHESIA/SEDATION: Moderate (conscious) sedation was employed during this procedure. A total of Versed 1.0 mg and Fentanyl 50 mcg was administered intravenously. Moderate Sedation Time: 11  minutes. The patient's level of consciousness and vital signs were monitored continuously by radiology nursing throughout the procedure under my direct supervision. FLUOROSCOPY TIME:  None COMPLICATIONS: None immediate. PROCEDURE: Informed written consent was obtained from the patient after a thorough discussion of the procedural risks, benefits and alternatives. All questions were addressed. A timeout was performed prior to the initiation of the procedure. Patient was placed prone. CT images through the pelvis were obtained. The destructive lesion in the posterior right ilium was targeted for biopsy. The overlying skin was prepped with chlorhexidine and sterile field was created. Skin and soft tissues were anesthetized with 1% lidocaine. Using CT guidance, 17 gauge coaxial needle was easily directed through the cortex and into the soft tissue lesion within the bone. A total of 4 core biopsies were obtained with an 18 gauge device. Specimens placed in formalin. Needle was removed without complication. FINDINGS: Soft tissue lesions involving the right sacrum and the posterior right ilium. Needle was easily advanced into the right posterior iliac bone lesion. Four soft tissue core biopsies were obtained. IMPRESSION: CT-guided core biopsies of the posterior right iliac bone lesion. Electronically Signed   By: Markus Daft M.D.   On: 12/22/2018 11:55     Assessment and plan #Acute bilateral multifocal PE, agree with anticoagulation with heparin  CT image finding was independent reviewed by me and discussed with patient. 12/20/2018 lower extremity Doppler showed.  Left calf posterior tibial DVT, low thrombus burden. Status post biopsy.  Breathing has improved.  Recommend to switch to Eliquis 10 mg twice daily x7 days followed by Eliquis 5 mg twice daily.   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 Aggressive malignant process. 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 No edema.  She will need palliative radiation outpatient.  Currently asymptomatic. Status post right ilium bone biopsy .  Awaiting pathology results.   #Acute hypoxic respiratory failure, multifactorial secondary to lung mass, lymphangitic spread, pleural effusion, PE.  Continue supportive care.  Oxygen to be weaned down as tolerated. #Leukocytosis.  Likely reactive. CAP.  Antibiotics ceftriaxone and azithromycin for coverage of underlying pneumonia.  Leukocytosis trending down.  #Hypercalcemia, secondary to bone metastasis and malignancy.  Status post Zometa 4 mg x 1.  Calcium level normalized to 9.8   #High uric acid, continue allopurinol #Lower back/hip pain.  Exacerbated with motion.  Currently on IV morphine. Patient prefers morphine over oxycodone or Norco.  Recommend to switch to oral morphine. Sacral mass, will need palliative radiation.  She has establish care with radiation oncology already.  #Had a lengthy discussion with patient and her husband.  I addressed all the questions they have today. Husband asked about if spine fusion or hip replacement should be considered.  Discussed with  him that palliative radiation will be the main modality for treating patients bone metastasis.  I do not see any urgent role of surgery in her case, unless bone structure was deemed to be unstable.  Given patient's incurable metastatic cancer, she will need to be on chemotherapy treatments until disease progression or intolerability Patient and husband asked about estimated time for pathology reports.  Explained to them.   Communicated with pathology.  Patient's biopsy is positive for squamous cell carcinoma of the lung.   Called the patient's room and discussed diagnosis of metastatic squamous cell lung cancer with both patient and her husband.  All questions are addressed.  Discussed that the main goal during this  hospitalization is to get pain controlled as well as improving breathing.  She will follow-up outpatient with me next week -[office will call her ]for further discussion of chemotherapy, will need referral to vascular surgeon for Mediport placement, chemotherapy class and hopefully starting chemotherapy during the week of 01/03/2019.  patient should  follow-up with Dr. Noreene Filbert for palliative radiation to sacral mass  Dr. Mike Gip is on call for the weekend.  CODE STATUS   Full code  Earlie Server, MD, PhD Hematology Oncology [place] at [facility]  12/24/2018

## 2018-12-24 NOTE — Progress Notes (Addendum)
[facility] at [place] NAME: Elizabeth [name]    MR#:  [id number]  DATE OF BIRTH:  [birth year]  SUBJECTIVE:  Cough is less ,on 4 L of oxygen, saturation 95%.  REVIEW OF SYSTEMS:  CONSTITUTIONAL: No fever, feels fatigued, tired. EYES: No blurred or double vision.  EARS, NOSE, AND THROAT: No tinnitus or ear pain.  RESPIRATORY: Mild shortness of breath, cough.  But better than before.   CARDIOVASCULAR: No chest pain, orthopnea, edema.  GASTROINTESTINAL: No nausea, vomiting, diarrhea or abdominal pain.  GENITOURINARY: No dysuria, hematuria.  ENDOCRINE: No polyuria, nocturia HEMATOLOGY: No anemia, easy bruising or bleeding SKIN: No rash or lesion. MUSCULOSKELETAL: No joint pain or arthritis.   NEUROLOGIC: No tingling, numbness, weakness.  PSYCHIATRY: No anxiety or depression.   DRUG ALLERGIES:   Allergies  Allergen Reactions  . Furadantin  [Nitrofurantoin]   . Phenobarbital Rash    VITALS:  Blood pressure (!) 150/81, pulse (!) 110, temperature 98.9 F (37.2 C), resp. rate 20, height 5' 6"  (1.676 m), weight 86.3 kg, SpO2 95 %.  PHYSICAL EXAMINATION:  GENERAL:  73 y.o.-year-old patient lying in the bed with no acute distress.   EYES: Pupils equal, round, reactive to light and accommodation. No scleral icterus. Extraocular muscles intact.  HEENT: Head atraumatic, normocephalic. Oropharynx and nasopharynx clear.  NECK:  Supple, no jugular venous distention. No thyroid enlargement, no tenderness.  LUNGS: + Clear to auscultation.  Rales,rhonchi or crepitation. No use of accessory muscles of respiration.  CARDIOVASCULAR: Tachycardic, regular rhythm, S1, S2 normal. No murmurs, rubs, or gallops.  ABDOMEN: Soft, nontender, nondistended. Bowel sounds present.  EXTREMITIES: No pedal edema, cyanosis, or clubbing.  NEUROLOGIC: Cranial nerves II through XII are intact. Muscle strength 5/5 in all extremities. Sensation intact. Gait not checked.   PSYCHIATRIC: The patient is alert and oriented x 3.  SKIN: No obvious rash, lesion, or ulcer.    LABORATORY PANEL:   CBC Recent Labs  Lab 12/24/18 0550  WBC 13.9*  HGB 11.6*  HCT 35.9*  PLT 275   ------------------------------------------------------------------------------------------------------------------  Chemistries  Recent Labs  Lab 12/17/18 2035  12/22/18 0403  NA 137   < > 137  K 2.7*   < > 3.7  CL 102   < > 102  CO2 24   < > 25  GLUCOSE 134*   < > 102*  BUN 26*   < > 20  CREATININE 0.83   < > 0.48  CALCIUM 12.2*   < > 9.8  AST 74*  --   --   ALT 95*  --   --   ALKPHOS 153*  --   --   BILITOT 0.8  --   --    < > = values in this interval not displayed.   ------------------------------------------------------------------------------------------------------------------  Cardiac Enzymes No results for input(s): TROPONINI in the last 168 hours. ------------------------------------------------------------------------------------------------------------------  RADIOLOGY:  Ct Biopsy  Result Date: 12/22/2018 INDICATION: 73 year old with probable metastatic disease and needs a tissue diagnosis. Lytic lesion involving the posterior right ilium. EXAM: CT-GUIDED CORE BIOPSY OF RIGHT ILIAC BONE LESION MEDICATIONS: None. ANESTHESIA/SEDATION: Moderate (conscious) sedation was employed during this procedure. A total of Versed 1.0 mg and Fentanyl 50 mcg was administered intravenously. Moderate Sedation Time: 11 minutes. The patient's level of consciousness and vital signs were monitored continuously by radiology nursing throughout the procedure under my direct supervision. FLUOROSCOPY TIME:  None COMPLICATIONS: None immediate. PROCEDURE: Informed written consent was obtained from the patient  after a thorough discussion of the procedural risks, benefits and alternatives. All questions were addressed. A timeout was performed prior to the initiation of the procedure. Patient was  placed prone. CT images through the pelvis were obtained. The destructive lesion in the posterior right ilium was targeted for biopsy. The overlying skin was prepped with chlorhexidine and sterile field was created. Skin and soft tissues were anesthetized with 1% lidocaine. Using CT guidance, 17 gauge coaxial needle was easily directed through the cortex and into the soft tissue lesion within the bone. A total of 4 core biopsies were obtained with an 18 gauge device. Specimens placed in formalin. Needle was removed without complication. FINDINGS: Soft tissue lesions involving the right sacrum and the posterior right ilium. Needle was easily advanced into the right posterior iliac bone lesion. Four soft tissue core biopsies were obtained. IMPRESSION: CT-guided core biopsies of the posterior right iliac bone lesion. Electronically Signed   By: Markus Daft M.D.   On: 12/22/2018 11:55    EKG:   Orders placed or performed during the hospital encounter of 12/17/18  . EKG 12-Lead  . EKG 12-Lead    ASSESSMENT AND PLAN:    Acute hypoxic respiratory failure- secondary to right lower lobe lung mass and multifocal PE.  Worsened respiratory status due to PE, lung cancer, on 4 to 5 L of oxygen, on heparin drip, spoke with oncology Dr. Tasia Catchings, patient may need to be on heparin drip for a little longer as per Dr. you. Left lower extremity DVT- seen on duplex US. -Continue heparin gtt,   New right lower lobe 6 cm lung mass with central necrosis- concerning for primary pulmonary malignancy with metastases to lymph nodes, liver, bone. -Oncology following, patient had markings for radiation therapy plan to start radiation therapy next week. Status post bone marrow biopsy , results are pending.     Hypercalcemia- improving, likely secondary to malignancy. -s/p IV Zometa x 1  Hypertension-continue beta-blockers.   Acute respiratory failure, multifactorial due to pulmonary embolus, lung cancer, pneumonia, patient  currently on antibiotics, Rocephin, Zithromax says that her cough is a little better, WBC also decreased.  Plan for discharge when able to wean off oxygen, patient is eager to go home as soon as possible and start therapy for metastatic lung cancer  Appreciate Dr. Tasia Catchings, palliative care talking to patient's family.  Spoke to patient today, she is appreciated above time talking to family, according to her salon to observe and also due to family dynamics as her daughter and husband are having difficulty with so much going on with her health. Started on Xanax by palliative care yesterday. all the records are reviewed and case discussed with Care Management/Social Workerr. Management plans discussed with the patient,she is  in agreement.  Patient said that she would update to the family members and requesting not to talk to her family members at this point of time  CODE STATUS: Full code  TOTAL TIME TAKING CARE OF THIS PATIENT: 40 minutes.   POSSIBLE D/C IN 3-4 DAYS, DEPENDING ON CLINICAL CONDITION.  Note: This dictation was prepared with Dragon dictation along with smaller phrase technology. Any transcriptional errors that result from this process are unintentional.   Epifanio Lesches M.D on 12/24/2018 at 11:35 AM  Between 7am to 6pm - Pager - [phone]  After 6pm go to [email or web address] - password [email or web address] [place] [facility]  Office  [phone]  CC: Primary care physician; Mar Daring, PA-C

## 2018-12-24 NOTE — Progress Notes (Signed)
PHARMACIST - PHYSICIAN COMMUNICATION DR:   Vianne Bulls CONCERNING: Antibiotic IV to Oral Route Change Policy  RECOMMENDATION: This patient is receiving Azithromycin by the intravenous route.  Based on criteria approved by the Pharmacy and Therapeutics Committee, the antibiotic(s) is/are being converted to the equivalent oral dose form(s).   DESCRIPTION: These criteria include:  Patient being treated for a respiratory tract infection, urinary tract infection, cellulitis or clostridium difficile associated diarrhea if on metronidazole  The patient is not neutropenic and does not exhibit a GI malabsorption state  The patient is eating (either orally or via tube) and/or has been taking other orally administered medications for a least 24 hours  The patient is improving clinically and has a Tmax < 100.5  If you have questions about this conversion, please contact the [place], PharmD, BCPS Clinical Pharmacist 12/24/2018 11:59 AM

## 2018-12-24 NOTE — Progress Notes (Signed)
[place] for heparin Indication: pulmonary embolus  Allergies  Allergen Reactions  . Furadantin  [Nitrofurantoin]   . Phenobarbital Rash   Patient Measurements: Height: 5\' 6"  (167.6 cm) Weight: 190 lb 3.2 oz (86.3 kg) IBW/kg (Calculated) : 59.3 Heparin Dosing Weight:77.8 kg  Vital Signs: Temp: 98.9 F (37.2 C) (08/07 0446) BP: 140/59 (08/07 0446) Pulse Rate: 112 (08/07 0446)  Labs: Recent Labs    12/22/18 0403  12/23/18 0521 12/23/18 1119 12/24/18 0550  HGB 12.0  --  12.2  --  11.6*  HCT 36.6  --  37.2  --  35.9*  PLT 205  --  225  --  275  HEPARINUNFRC 0.21*   < > 0.44 0.42 0.28*  CREATININE 0.48  --   --   --   --    < > = values in this interval not displayed.    Estimated Creatinine Clearance: 68.3 mL/min (by C-G formula based on SCr of 0.48 mg/dL).  Medical History: Past Medical History:  Diagnosis Date  . Allergy   . Cancer ([place]) 09/2014   Skin Cancer  . Hyperlipidemia   . Hypertension    Medications:  Scheduled:  . allopurinol  300 mg Oral Daily  . feeding supplement (ENSURE ENLIVE)  237 mL Oral BID BM  . fluticasone  2 spray Each Nare Daily  . folic acid  1 mg Oral Daily  . gabapentin  300 mg Oral TID  . heparin  1,100 Units Intravenous Once  . loratadine  10 mg Oral Daily  . mouth rinse  15 mL Mouth Rinse BID  . metoprolol succinate  50 mg Oral Daily  . montelukast  10 mg Oral Daily  . potassium chloride  20 mEq Oral BID  . sodium chloride flush  3 mL Intravenous Q12H    Assessment: Patient admitted for SOB s/t to lung mets that has possibly metastasized to liver. Patient has PE on CT and is going for liver biopsy this am at around 0930. Patient is to be started on heparin drip @ 0945 post-biopsy.  Heparin Course: 8/3 Heparin initiated at 1200 units/hr 8/3 @6 :10 HL 0.17, Heparin 2300 units bolus, increase rate to 1500 units/hr 8/4 @ 01:32 HL 0.31, Heparin drip increased to 1550 units/hr 8/4 @ 08:57  HL 0.45 8/4 @ 1733 HL 0.48  8/5 @ 0403 HL 0.21 8/5: Heparin was held today at 0930 then restarted at 1320 at 1700 units/hr.  Confirmed with nurse there have been on interruptions in infusion since restart.  HL low at 0.24 (at 2124)- Infusion increased to 1850 units/hr   8/6 @ 0521 HL: 0.44. Level is therapeutic x 1. Continue heparin infusion rate of 1850 units/hr  8/7 @ 0550 HL: 0.28  Level is subtherapeutic.  Verified w/ nursing that infusion was not stopped or interrupted.    Goal of Therapy:  Heparin level 0.3-0.7 units/ml Monitor platelets by anticoagulation protocol: Yes   Plan:  8/7 @ 0550 HL 0.28  Level is subtherapeutic -will rebolus with Heparin 1100 units x 1 and increase heparin rate to 2000 units/hr, will recheck HL in 6 hours and monitor CBC's daily per protocol.  Ena Dawley, PharmD Clinical Pharmacist 12/24/2018 6:37 AM

## 2018-12-25 LAB — CBC
HCT: 40.3 % (ref 36.0–46.0)
Hemoglobin: 12.8 g/dL (ref 12.0–15.0)
MCH: 30.8 pg (ref 26.0–34.0)
MCHC: 31.8 g/dL (ref 30.0–36.0)
MCV: 96.9 fL (ref 80.0–100.0)
Platelets: 250 10*3/uL (ref 150–400)
RBC: 4.16 MIL/uL (ref 3.87–5.11)
RDW: 13.2 % (ref 11.5–15.5)
WBC: 14.3 10*3/uL — ABNORMAL HIGH (ref 4.0–10.5)
nRBC: 0 % (ref 0.0–0.2)

## 2018-12-25 MED ORDER — METHOTREXATE 2.5 MG PO TABS
7.5000 mg | ORAL_TABLET | ORAL | Status: DC
  Administered 2018-12-25: 18:00:00 7.5 mg via ORAL
  Filled 2018-12-25: qty 3

## 2018-12-25 MED ORDER — FENTANYL 12 MCG/HR TD PT72
1.0000 | MEDICATED_PATCH | TRANSDERMAL | Status: DC
  Administered 2018-12-25: 1 via TRANSDERMAL
  Filled 2018-12-25: qty 1

## 2018-12-25 MED ORDER — SALINE SPRAY 0.65 % NA SOLN
1.0000 | NASAL | Status: DC | PRN
  Filled 2018-12-25: qty 44

## 2018-12-25 MED ORDER — HYDROCODONE-ACETAMINOPHEN 5-325 MG PO TABS
1.0000 | ORAL_TABLET | ORAL | Status: DC | PRN
  Administered 2018-12-25 – 2018-12-26 (×3): 1 via ORAL
  Filled 2018-12-25 (×3): qty 1

## 2018-12-25 NOTE — Progress Notes (Signed)
[facility] at [place] NAME: Elizabeth [name]    MR#:  [id number]  DATE OF BIRTH:  [birth year]  SUBJECTIVE:   Patient presents to the hospital due to hip pain, shortness of breath and noted to have lung cancer with extensive metastatic disease.  Patient also noted to have pulmonary embolism with underlying pneumonia.  Patient is still complaining of some hip pain on the left side.  Still has some worsening hypoxemia on minimal exertion.  No other complaints presently.  REVIEW OF SYSTEMS:    Review of Systems  Constitutional: Negative for chills and fever.  HENT: Negative for congestion and tinnitus.   Eyes: Negative for blurred vision and double vision.  Respiratory: Positive for shortness of breath. Negative for cough and wheezing.   Cardiovascular: Negative for chest pain, orthopnea and PND.  Gastrointestinal: Negative for abdominal pain, diarrhea, nausea and vomiting.  Genitourinary: Negative for dysuria and hematuria.  Musculoskeletal: Positive for joint pain (Left hip).  Neurological: Positive for weakness (generalized). Negative for dizziness, sensory change and focal weakness.  All other systems reviewed and are negative.   Nutrition: Heart Healthy Tolerating Diet: Yes Tolerating PT: Eval noted.    DRUG ALLERGIES:   Allergies  Allergen Reactions  . Furadantin  [Nitrofurantoin]   . Phenobarbital Rash    VITALS:  Blood pressure 116/70, pulse (!) 105, temperature 97.7 F (36.5 C), temperature source Oral, resp. rate (!) 28, height 5\' 6"  (1.676 m), weight 86.3 kg, SpO2 98 %.  PHYSICAL EXAMINATION:   Physical Exam  GENERAL:  74 y.o.-year-old patient lying in bed in mild Resp. Distress.  EYES: Pupils equal, round, reactive to light and accommodation. No scleral icterus. Extraocular muscles intact.  HEENT: Head atraumatic, normocephalic. Oropharynx and nasopharynx clear.  NECK:  Supple, no jugular venous distention. No thyroid  enlargement, no tenderness.  LUNGS: Normal breath sounds bilaterally, no wheezing, rales, rhonchi. No use of accessory muscles of respiration.  CARDIOVASCULAR: S1, S2 normal, Tachy. No murmurs, rubs, or gallops.  ABDOMEN: Soft, nontender, nondistended. Bowel sounds present. No organomegaly or mass.  EXTREMITIES: No cyanosis, clubbing or edema b/l.    NEUROLOGIC: Cranial nerves II through XII are intact. No focal Motor or sensory deficits b/l. Globally weak  PSYCHIATRIC: The patient is alert and oriented x 3.  SKIN: No obvious rash, lesion, or ulcer.    LABORATORY PANEL:   CBC Recent Labs  Lab 12/25/18 0445  WBC 14.3*  HGB 12.8  HCT 40.3  PLT 250   ------------------------------------------------------------------------------------------------------------------  Chemistries  Recent Labs  Lab 12/22/18 0403  NA 137  K 3.7  CL 102  CO2 25  GLUCOSE 102*  BUN 20  CREATININE 0.48  CALCIUM 9.8   ------------------------------------------------------------------------------------------------------------------  Cardiac Enzymes No results for input(s): TROPONINI in the last 168 hours. ------------------------------------------------------------------------------------------------------------------  RADIOLOGY:  No results found.   ASSESSMENT AND PLAN:   73 year old female hypertension, hyperlipidemia, neuropathy, osteoarthritis who presented to the hospital due to hip pain, shortness of breath and underwent a CT of the chest which was positive for lung mass/pulmonary embolism.  1.   Acute respiratory failure with hypoxia- secondary to pulmonary embolism/underlying pneumonia and also lung mass/cancer. -Continue O2 supplementation, continue IV antibiotics for pneumonia, switched from heparin drip to oral Eliquis for the pulmonary embolism. - Patient currently on 4 L nasal cannula and improving.  2.  Pulmonary embolism-this is a provoked pulmonary embolism given patient's  underlying malignancy. -Continue Eliquis for now.  3.  Lung cancer- patient had a lung mass and has lymphadenopathy consistent with metastatic disease.  Patient's MRI is also positive for intracranial metastases. - Appreciate oncology consult and they have had extensive discussions with the patient and patient's family about her poor prognosis.  Patient likely to get palliative radiation and chemotherapy as an outpatient. -We will arrange outpatient oncology follow-up upon discharge.  4.  Pneumonia- continue Rocephin, Zithromax, follow cultures which are currently negative.  5.  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 -Status post 1 dose of IV Zometa and clinically stable.  6.  Hip pain-secondary to bony metastases.  Will change patient's pain control from IV morphine to fentanyl patch and Norco as needed. -Follow response.  7.  Neuropathy-continue gabapentin.  8.  Anxiety-continue Xanax.  9.  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  Continue allopurinol.  10. Essential HTN - cont. Toprol.   All the records are reviewed and case discussed with Care Management/Social Worker. Management plans discussed with the patient, family and they are in agreement.  CODE STATUS: Full code  DVT Prophylaxis: Eliquis  TOTAL TIME TAKING CARE OF THIS PATIENT: 30 minutes.   POSSIBLE D/C IN 1-2 DAYS, DEPENDING ON CLINICAL CONDITION.   Henreitta Leber M.D on 12/25/2018 at 1:56 PM  Between 7am to 6pm - Pager - [phone]  After 6pm go to [email or web address] - Technical brewer [facility]  Office  [phone]  CC: Primary care physician; Mar Daring, PA-C

## 2018-12-26 MED ORDER — FENTANYL 12 MCG/HR TD PT72: 1.0000 | MEDICATED_PATCH | TRANSDERMAL | 0 refills | Status: AC

## 2018-12-26 MED ORDER — APIXABAN 5 MG PO TABS: ORAL_TABLET | ORAL | 0 refills | Status: AC

## 2018-12-26 MED ORDER — ALLOPURINOL 300 MG PO TABS: 300.0000 mg | ORAL_TABLET | Freq: Every day | ORAL | 0 refills | Status: DC

## 2018-12-26 MED ORDER — POLYETHYLENE GLYCOL 3350 17 G PO PACK: 17.0000 g | PACK | Freq: Every day | ORAL | 0 refills | Status: AC | PRN

## 2018-12-26 MED ORDER — LEVOFLOXACIN 500 MG PO TABS: 500.0000 mg | ORAL_TABLET | Freq: Every day | ORAL | 0 refills | Status: AC

## 2018-12-26 MED ORDER — HYDROCODONE-ACETAMINOPHEN 5-325 MG PO TABS: 1.0000 | ORAL_TABLET | Freq: Four times a day (QID) | ORAL | 0 refills | Status: AC | PRN

## 2018-12-26 NOTE — Discharge Summary (Signed)
[facility] [place] NAME: Elizabeth [name]    MR#:  [id number]  DATE OF BIRTH:  [birth year]  DATE OF ADMISSION:  12/17/2018 ADMITTING PHYSICIAN: Christel Mormon, MD  DATE OF DISCHARGE: 12/26/2018  PRIMARY CARE PHYSICIAN: Mar Daring, PA-C    ADMISSION DIAGNOSIS:  Lung mass [R91.8] Hypoxia [R09.02] Metastatic malignant neoplasm, unspecified site ([place]) [C79.9]  DISCHARGE DIAGNOSIS:  Active Problems:   Hypercalcemia   Respiratory failure, acute (HCC)   Hypoxia   Lung mass   Metastatic malignant neoplasm (HCC)   Hyperuricemia   Acute bilateral low back pain with bilateral sciatica   Pulmonary embolus (HCC)   History of pulmonary embolism   Palliative care encounter   Bone lesion   Squamous cell carcinoma of right lung ([place])   SECONDARY DIAGNOSIS:   Past Medical History:  Diagnosis Date  . Allergy   . Cancer ([place]) 09/2014   Skin Cancer  . Hyperlipidemia   . Hypertension     HOSPITAL COURSE:   73 year old female hypertension, hyperlipidemia, neuropathy, osteoarthritis who presented to the hospital due to hip pain, shortness of breath and underwent a CT of the chest which was positive for lung mass/pulmonary embolism.  1.   Acute respiratory failure with hypoxia- secondary to pulmonary embolism/underlying pneumonia and also lung mass/cancer. -Patient was treated with IV antibiotics for the pneumonia with Rocephin, Zithromax, also given IV heparin initially for the PE and then switched to oral Eliquis. -Patient did qualify for home oxygen which is being arranged for her prior to discharge.  She now has a significant O2 requirement given her underlying PE, lung cancer.  2.  Pulmonary embolism-this is a provoked pulmonary embolism given patient's underlying malignancy. - pt. Was initially on Heparin gtt and then switched to Oral Eliquis and is being discharged on Eliquis.   3.  Lung cancer- patient had a lung mass and has  lymphadenopathy consistent with metastatic disease.  Patient's MRI is also positive for intracranial metastases. -Patient was seen by oncology and given her extensive disease patient is going to be treated with palliative radiation and chemotherapy but this is to be further discussed with the oncologist as an outpatient. - pt. Will follow up with Dr. Tasia Catchings as outpatient.   4.  Pneumonia- patient was treated with IV ceftriaxone, Zithromax while in the hospital and now being discharged on oral Levaquin for a few more days.  5.  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 -Status post 1 dose of IV Zometa and calcium level has normalized.  6.  Hip pain-secondary to bony metastases.   -Initially patient was treated with IV morphine, but now has been switched over to fentanyl patch and oral Vicodin and will be discharged on that.  7.  Neuropathy- pt. Will continue gabapentin.  9.  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  pt. Will Continue allopurinol.  10. Essential HTN - pt. will cont. Toprol.   DISCHARGE CONDITIONS:   Fair  CONSULTS OBTAINED:  Treatment Team:  Ottie Glazier, MD  DRUG ALLERGIES:   Allergies  Allergen Reactions  . Furadantin  [Nitrofurantoin]   . Phenobarbital Rash    DISCHARGE MEDICATIONS:   Allergies as of 12/26/2018      Reactions   Furadantin  [nitrofurantoin]    Phenobarbital Rash      Medication List    STOP taking these medications   hydrochlorothiazide 12.5 MG capsule Commonly known as: MICROZIDE   losartan 100 MG tablet Commonly known as: COZAAR   traMADol  50 MG tablet Commonly known as: ULTRAM     TAKE these medications   albuterol 108 (90 Base) MCG/ACT inhaler Commonly known as: VENTOLIN HFA Inhale 2 puffs into the lungs every 6 (six) hours as needed for wheezing or shortness of breath.   allopurinol 300 MG tablet Commonly known as: ZYLOPRIM Take 1 tablet (300 mg total) by mouth daily.   apixaban 5 MG Tabs  tablet Commonly known as: ELIQUIS Take 10 mg PO BID X 6 days and then start 5 mg PO BID thereafter.   benzonatate 200 MG capsule Commonly known as: TESSALON Take 1 capsule (200 mg total) by mouth 3 (three) times daily as needed.   cetirizine 10 MG tablet Commonly known as: ZYRTEC TAKE 1 TABLET BY MOUTH ONCE A DAY   cholecalciferol 25 MCG (1000 UT) tablet Commonly known as: VITAMIN D Take 1,000 Units by mouth daily.   CINNAMON PO Take by mouth daily.   fentaNYL 12 MCG/HR Commonly known as: [place] 1 patch onto the skin every 3 (three) days. Start taking on: December 28, 2018   fluticasone 50 MCG/ACT nasal spray Commonly known as: FLONASE PLACE 2 SPRAYS INTO BOTH NOSTRILS DAILY   Fluticasone-Salmeterol 250-50 MCG/DOSE Aepb Commonly known as: Advair Diskus INHALE 1 PUFF INTO LUNGS EVERY 12 HOURS (TWICE DAILY) RINSE MOUTH AFTER USE. What changed:   when to take this  reasons to take this  additional instructions   folic acid 1 MG tablet Commonly known as: FOLVITE Take 1 mg by mouth daily.   gabapentin 300 MG capsule Commonly known as: NEURONTIN Take 1 capsule (300 mg total) by mouth 3 (three) times daily.   GINGER PO Take by mouth.   HERBAL ENERGY COMPLEX PO Take by mouth daily.   HYDROcodone-acetaminophen 5-325 MG tablet Commonly known as: Norco Take 1 tablet by mouth every 6 (six) hours as needed for moderate pain.   levofloxacin 500 MG tablet Commonly known as: LEVAQUIN Take 1 tablet (500 mg total) by mouth daily for 5 days.   methotrexate 2.5 MG tablet Take 7.5 mg by mouth once a week.   metoprolol succinate 50 MG 24 hr tablet Commonly known as: TOPROL-XL TAKE 1 TABLET BY MOUTH TWICE A DAY WITH OR IMMEDIATELY FOLLOWING A MEAL   montelukast 10 MG tablet Commonly known as: SINGULAIR TAKE 1 TABLET BY MOUTH ONCE DAILY   Multiple Vitamin tablet Take 1 tablet by mouth daily.   polyethylene glycol 17 g packet Commonly known as: MIRALAX /  GLYCOLAX Take 17 g by mouth daily as needed for mild constipation.   PRESERVISION AREDS PO Take by mouth daily.   PROBIOTIC-10 PO Take by mouth daily.   tiZANidine 4 MG tablet Commonly known as: ZANAFLEX TAKE 1 TABLET BY MOUTH 3 TIMES DAILY AS NEEDED FOR MUSCLE SPASMS   TURMERIC PO Take by mouth daily.   vitamin E 1000 UNIT capsule Take 1,000 Units by mouth daily.   ZINC PO Take by mouth daily.            Durable Medical Equipment  (From admission, onward)         Start     Ordered   12/26/18 1014  DME Oxygen  Once    Question Answer Comment  Length of Need Lifetime   Mode or (Route) Nasal cannula   Liters per Minute 3   Frequency Continuous (stationary and portable oxygen unit needed)   Oxygen conserving device Yes   Oxygen delivery system Gas  12/26/18 1014            DISCHARGE INSTRUCTIONS:   DIET:  Cardiac diet  DISCHARGE CONDITION:  Fair  ACTIVITY:  Activity as tolerated  OXYGEN:  Home Oxygen: Yes.     Oxygen Delivery: 4 liters/min via Patient connected to nasal cannula oxygen  DISCHARGE LOCATION:  Home with Kila, OT, RN, Aide, Social Work.    If you experience worsening of your admission symptoms, develop shortness of breath, life threatening emergency, suicidal or homicidal thoughts you must seek medical attention immediately by calling 911 or calling your MD immediately  if symptoms less severe.  You Must read complete instructions/literature along with all the possible adverse reactions/side effects for all the Medicines you take and that have been prescribed to you. Take any new Medicines after you have completely understood and accpet all the possible adverse reactions/side effects.   Please note  You were cared for by a hospitalist during your hospital stay. If you have any questions about your discharge medications or the care you received while you were in the hospital after you are discharged, you can call the  unit and asked to speak with the hospitalist on call if the hospitalist that took care of you is not available. Once you are discharged, your primary care physician will handle any further medical issues. Please note that NO REFILLS for any discharge medications will be authorized once you are discharged, as it is imperative that you return to your primary care physician (or establish a relationship with a primary care physician if you do not have one) for your aftercare needs so that they can reassess your need for medications and monitor your lab values.     Today   Pain has improved.  Shortness of breath improved but worse with exertion. Still gets tachy with minimal exertion but asymptomatic.    Will d/c home today and discussed with pt's husband with outpatient follow up with Oncology.   VITAL SIGNS:  Blood pressure 135/73, pulse (!) 131, temperature 99.6 F (37.6 C), resp. rate (!) 26, height 5\' 6"  (1.676 m), weight 86.3 kg, SpO2 95 %.  I/O:    Intake/Output Summary (Last 24 hours) at 12/26/2018 1300 Last data filed at 12/26/2018 0246 Gross per 24 hour  Intake 99.93 ml  Output -  Net 99.93 ml    PHYSICAL EXAMINATION:  GENERAL:  73 y.o.-year-old patient lying in the bed with no acute distress.  EYES: Pupils equal, round, reactive to light and accommodation. No scleral icterus. Extraocular muscles intact.  HEENT: Head atraumatic, normocephalic. Oropharynx and nasopharynx clear.  NECK:  Supple, no jugular venous distention. No thyroid enlargement, no tenderness.  LUNGS: Good a/e b/l, no wheezing, rales,rhonchi. No use of accessory muscles of respiration.  CARDIOVASCULAR: S1, S2 Tachycardic. No murmurs, rubs, or gallops.  ABDOMEN: Soft, non-tender, non-distended. Bowel sounds present. No organomegaly or mass.  EXTREMITIES: No pedal edema, cyanosis, or clubbing.  NEUROLOGIC: Cranial nerves II through XII are intact. No focal motor or sensory defecits b/l. Globally weak.  PSYCHIATRIC:  The patient is alert and oriented x 3.  SKIN: No obvious rash, lesion, or ulcer.   DATA REVIEW:   CBC Recent Labs  Lab 12/25/18 0445  WBC 14.3*  HGB 12.8  HCT 40.3  PLT 250    Chemistries  Recent Labs  Lab 12/22/18 0403  NA 137  K 3.7  CL 102  CO2 25  GLUCOSE 102*  BUN 20  CREATININE 0.48  CALCIUM 9.8    Cardiac Enzymes No results for input(s): TROPONINI in the last 168 hours.  Microbiology Results  Results for orders placed or performed during the hospital encounter of 12/17/18  SARS Coronavirus 2 [facility] order, Performed in [facility] hospital lab) Nasopharyngeal Nasopharyngeal Swab     Status: None   Collection Time: 12/17/18  8:55 PM   Specimen: Nasopharyngeal Swab  Result Value Ref Range Status   SARS Coronavirus 2 NEGATIVE NEGATIVE Final    Comment: (NOTE) If result is NEGATIVE SARS-CoV-2 target nucleic acids are NOT DETECTED. The SARS-CoV-2 RNA is generally detectable in upper and lower  respiratory specimens during the acute phase of infection. The lowest  concentration of SARS-CoV-2 viral copies this assay can detect is 250  copies / mL. A negative result does not preclude SARS-CoV-2 infection  and should not be used as the sole basis for treatment or other  patient management decisions.  A negative result may occur with  improper specimen collection / handling, submission of specimen other  than nasopharyngeal swab, presence of viral mutation(s) within the  areas targeted by this assay, and inadequate number of viral copies  (<250 copies / mL). A negative result must be combined with clinical  observations, patient history, and epidemiological information. If result is POSITIVE SARS-CoV-2 target nucleic acids are DETECTED. The SARS-CoV-2 RNA is generally detectable in upper and lower  respiratory specimens dur ing the acute phase of infection.  Positive  results are indicative of active infection with SARS-CoV-2.  Clinical  correlation with  patient history and other diagnostic information is  necessary to determine patient infection status.  Positive results do  not rule out bacterial infection or co-infection with other viruses. If result is PRESUMPTIVE POSTIVE SARS-CoV-2 nucleic acids MAY BE PRESENT.   A presumptive positive result was obtained on the submitted specimen  and confirmed on repeat testing.  While 2019 novel coronavirus  (SARS-CoV-2) nucleic acids may be present in the submitted sample  additional confirmatory testing may be necessary for epidemiological  and / or clinical management purposes  to differentiate between  SARS-CoV-2 and other Sarbecovirus currently known to infect humans.  If clinically indicated additional testing with an alternate test  methodology [id number]) is advised. The SARS-CoV-2 RNA is generally  detectable in upper and lower respiratory sp ecimens during the acute  phase of infection. The expected result is Negative. Fact Sheet for Patients:  StrictlyIdeas.no Fact Sheet for Healthcare Providers: BankingDealers.co.za This test is not yet approved or cleared by the [place] FDA and has been authorized for detection and/or diagnosis of SARS-CoV-2 by FDA under an Emergency Use Authorization (EUA).  This EUA will remain in effect (meaning this test can be used) for the duration of the COVID-19 declaration under Section 564(b)(1) of the Act, 21 U.S.C. section 360bbb-3(b)(1), unless the authorization is terminated or revoked sooner. Performed at [facility], [address], [place], [place] [address]     RADIOLOGY:  No results found.    Management plans discussed with the patient, family and they are in agreement.  CODE STATUS:     Code Status Orders  (From admission, onward)         Start     Ordered   12/18/18 0221  Full code  Continuous     12/18/18 0220        TOTAL TIME TAKING CARE OF THIS PATIENT: 40  minutes.    Henreitta Leber M.D on 12/26/2018 at 1:00 PM  Between 7am to 6pm - Pager - [phone]  After 6pm go to [email or web address] - [employer] [facility]  Office  [phone]  CC: Primary care physician; Mar Daring, PA-C

## 2018-12-26 NOTE — Progress Notes (Signed)
SATURATION QUALIFICATIONS: (This note is used to comply with regulatory documentation for home oxygen)  Patient Saturations on Room Air at Rest = 88%  Patient Saturations on Room Air while Ambulating = 77%  Patient Saturations on 4 Liters of oxygen while Ambulating = 87 %  Please briefly explain why patient needs home oxygen: Patient desaturates when walking 30 ft on 4L of oxygen. Patient also becomes extremely tachycardic with a heart rate of 151 bpm and tachypneic with respirations of 45-50.

## 2018-12-26 NOTE — TOC Transition Note (Signed)
Transition of Care [facility]) - CM/SW Discharge Note   Patient Details  Name: Elizabeth [name] MRN: [id number] Date of Birth: 25-Oct-1945  [facility] CM/SW Contact:  Latanya Maudlin, RN Phone Number: 12/26/2018, 2:00 PM   Clinical Narrative:  Patient with orders for DME oxygen. Qualifying saturation note, MD orders in place. [employer] contacted for delivery. Portable tank to be delivered to the bedside and additional equipment scheduled to go to the home address. No other TOC team needs.        Final next level of care: Home/Self Care Barriers to Discharge: No Barriers Identified   Patient Goals and CMS Choice   CMS Medicare.gov Compare Post Acute Care list provided to:: Patient Choice offered to / list presented to : Patient  Discharge Placement                       Discharge Plan and Services                DME Arranged: Oxygen DME Agency: [facility] Date DME Agency Contacted: 12/26/18 Time DME Agency Contacted: 1400 Representative spoke with at DME Agency: [place] ([place]) Interventions     Readmission Risk Interventions No flowsheet data found.

## 2018-12-27 ENCOUNTER — Other Ambulatory Visit: Payer: Self-pay | Admitting: Oncology

## 2018-12-27 ENCOUNTER — Telehealth: Payer: Self-pay

## 2018-12-27 ENCOUNTER — Telehealth: Payer: Self-pay | Admitting: Physician Assistant

## 2018-12-27 ENCOUNTER — Other Ambulatory Visit: Payer: Self-pay

## 2018-12-27 ENCOUNTER — Other Ambulatory Visit: Payer: Self-pay | Admitting: Physician Assistant

## 2018-12-27 ENCOUNTER — Telehealth (INDEPENDENT_AMBULATORY_CARE_PROVIDER_SITE_OTHER): Payer: Self-pay

## 2018-12-27 DIAGNOSIS — J301 Allergic rhinitis due to pollen: Secondary | ICD-10-CM

## 2018-12-27 DIAGNOSIS — M899 Disorder of bone, unspecified: Secondary | ICD-10-CM

## 2018-12-27 DIAGNOSIS — J9600 Acute respiratory failure, unspecified whether with hypoxia or hypercapnia: Secondary | ICD-10-CM

## 2018-12-27 DIAGNOSIS — R918 Other nonspecific abnormal finding of lung field: Secondary | ICD-10-CM

## 2018-12-27 DIAGNOSIS — C799 Secondary malignant neoplasm of unspecified site: Secondary | ICD-10-CM

## 2018-12-27 DIAGNOSIS — C3491 Malignant neoplasm of unspecified part of right bronchus or lung: Secondary | ICD-10-CM

## 2018-12-27 DIAGNOSIS — I2699 Other pulmonary embolism without acute cor pulmonale: Secondary | ICD-10-CM

## 2018-12-27 MED ORDER — PROCHLORPERAZINE MALEATE 10 MG PO TABS: 10.0000 mg | ORAL_TABLET | Freq: Four times a day (QID) | ORAL | 1 refills | Status: AC | PRN

## 2018-12-27 MED ORDER — ONDANSETRON HCL 8 MG PO TABS: 8.0000 mg | ORAL_TABLET | Freq: Two times a day (BID) | ORAL | 1 refills | Status: AC | PRN

## 2018-12-27 MED ORDER — LIDOCAINE-PRILOCAINE 2.5-2.5 % EX CREA: TOPICAL_CREAM | CUTANEOUS | 3 refills | Status: AC

## 2018-12-27 MED ORDER — DEXAMETHASONE 4 MG PO TABS: ORAL_TABLET | ORAL | 1 refills | Status: AC

## 2018-12-27 NOTE — Patient Instructions (Signed)
Pembrolizumab injection What is this medicine? PEMBROLIZUMAB (pem broe liz ue mab) is a monoclonal antibody. It is used to treat bladder cancer, cervical cancer, endometrial cancer, esophageal cancer, head and neck cancer, hepatocellular cancer, Hodgkin lymphoma, kidney cancer, lymphoma, melanoma, Merkel cell carcinoma, lung cancer, stomach cancer, urothelial cancer, and cancers that have a certain genetic condition. This medicine may be used for other purposes; ask your health care provider or pharmacist if you have questions. COMMON BRAND NAME(S): Keytruda What should I tell my health care provider before I take this medicine? They need to know if you have any of these conditions:  diabetes  immune system problems  inflammatory bowel disease  liver disease  lung or breathing disease  lupus  received or scheduled to receive an organ transplant or a stem-cell transplant that uses donor stem cells  an unusual or allergic reaction to pembrolizumab, other medicines, foods, dyes, or preservatives  pregnant or trying to get pregnant  breast-feeding How should I use this medicine? This medicine is for infusion into a vein. It is given by a health care professional in a hospital or clinic setting. A special MedGuide will be given to you before each treatment. Be sure to read this information carefully each time. Talk to your pediatrician regarding the use of this medicine in children. While this drug may be prescribed for selected conditions, precautions do apply. Overdosage: If you think you have taken too much of this medicine contact a poison control center or emergency room at once. NOTE: This medicine is only for you. Do not share this medicine with others. What if I miss a dose? It is important not to miss your dose. Call your doctor or health care professional if you are unable to keep an appointment. What may interact with this medicine? Interactions have not been studied. Give  your health care provider a list of all the medicines, herbs, non-prescription drugs, or dietary supplements you use. Also tell them if you smoke, drink alcohol, or use illegal drugs. Some items may interact with your medicine. This list may not describe all possible interactions. Give your health care provider a list of all the medicines, herbs, non-prescription drugs, or dietary supplements you use. Also tell them if you smoke, drink alcohol, or use illegal drugs. Some items may interact with your medicine. What should I watch for while using this medicine? Your condition will be monitored carefully while you are receiving this medicine. You may need blood work done while you are taking this medicine. Do not become pregnant while taking this medicine or for 4 months after stopping it. Women should inform their doctor if they wish to become pregnant or think they might be pregnant. There is a potential for serious side effects to an unborn child. Talk to your health care professional or pharmacist for more information. Do not breast-feed an infant while taking this medicine or for 4 months after the last dose. What side effects may I notice from receiving this medicine? Side effects that you should report to your doctor or health care professional as soon as possible:  allergic reactions like skin rash, itching or hives, swelling of the face, lips, or tongue  bloody or black, tarry  breathing problems  changes in vision  chest pain  chills  confusion  constipation  cough  diarrhea  dizziness or feeling faint or lightheaded  fast or irregular heartbeat  fever  flushing  hair loss  joint pain  low blood counts - this   medicine may decrease the number of white blood cells, red blood cells and platelets. You may be at increased risk for infections and bleeding.  muscle pain  muscle weakness  persistent headache  redness, blistering, peeling or loosening of the skin,  including inside the mouth  signs and symptoms of high blood sugar such as dizziness; dry mouth; dry skin; fruity breath; nausea; stomach pain; increased hunger or thirst; increased urination  signs and symptoms of kidney injury like trouble passing urine or change in the amount of urine  signs and symptoms of liver injury like dark urine, light-colored stools, loss of appetite, nausea, right upper belly pain, yellowing of the eyes or skin  sweating  swollen lymph nodes  weight loss Side effects that usually do not require medical attention (report to your doctor or health care professional if they continue or are bothersome):  decreased appetite  muscle pain  tiredness This list may not describe all possible side effects. Call your doctor for medical advice about side effects. You may report side effects to FDA at 1-800-FDA-1088. Where should I keep my medicine? This drug is given in a hospital or clinic and will not be stored at home. NOTE: This sheet is a summary. It may not cover all possible information. If you have questions about this medicine, talk to your doctor, pharmacist, or health care provider.  2020 Elsevier/Gold Standard (2018-06-01 13:46:58)  Paclitaxel injection What is this medicine? PACLITAXEL (PAK li TAX el) is a chemotherapy drug. It targets fast dividing cells, like cancer cells, and causes these cells to die. This medicine is used to treat ovarian cancer, breast cancer, lung cancer, Kaposi's sarcoma, and other cancers. This medicine may be used for other purposes; ask your health care provider or pharmacist if you have questions. COMMON BRAND NAME(S): Onxol, Taxol What should I tell my health care provider before I take this medicine? They need to know if you have any of these conditions:  history of irregular heartbeat  liver disease  low blood counts, like low white cell, platelet, or red cell counts  lung or breathing disease, like  asthma  tingling of the fingers or toes, or other nerve disorder  an unusual or allergic reaction to paclitaxel, alcohol, polyoxyethylated castor oil, other chemotherapy, other medicines, foods, dyes, or preservatives  pregnant or trying to get pregnant  breast-feeding How should I use this medicine? This drug is given as an infusion into a vein. It is administered in a hospital or clinic by a specially trained health care professional. Talk to your pediatrician regarding the use of this medicine in children. Special care may be needed. Overdosage: If you think you have taken too much of this medicine contact a poison control center or emergency room at once. NOTE: This medicine is only for you. Do not share this medicine with others. What if I miss a dose? It is important not to miss your dose. Call your doctor or health care professional if you are unable to keep an appointment. What may interact with this medicine? Do not take this medicine with any of the following medications:  disulfiram  metronidazole This medicine may also interact with the following medications:  antiviral medicines for hepatitis, HIV or AIDS  certain antibiotics like erythromycin and clarithromycin  certain medicines for fungal infections like ketoconazole and itraconazole  certain medicines for seizures like carbamazepine, phenobarbital, phenytoin  gemfibrozil  nefazodone  rifampin  St. John's wort This list may not describe all possible interactions.   Give your health care provider a list of all the medicines, herbs, non-prescription drugs, or dietary supplements you use. Also tell them if you smoke, drink alcohol, or use illegal drugs. Some items may interact with your medicine. What should I watch for while using this medicine? Your condition will be monitored carefully while you are receiving this medicine. You will need important blood work done while you are taking this medicine. This  medicine can cause serious allergic reactions. To reduce your risk you will need to take other medicine(s) before treatment with this medicine. If you experience allergic reactions like skin rash, itching or hives, swelling of the face, lips, or tongue, tell your doctor or health care professional right away. In some cases, you may be given additional medicines to help with side effects. Follow all directions for their use. This drug may make you feel generally unwell. This is not uncommon, as chemotherapy can affect healthy cells as well as cancer cells. Report any side effects. Continue your course of treatment even though you feel ill unless your doctor tells you to stop. Call your doctor or health care professional for advice if you get a fever, chills or sore throat, or other symptoms of a cold or flu. Do not treat yourself. This drug decreases your body's ability to fight infections. Try to avoid being around people who are sick. This medicine may increase your risk to bruise or bleed. Call your doctor or health care professional if you notice any unusual bleeding. Be careful brushing and flossing your teeth or using a toothpick because you may get an infection or bleed more easily. If you have any dental work done, tell your dentist you are receiving this medicine. Avoid taking products that contain aspirin, acetaminophen, ibuprofen, naproxen, or ketoprofen unless instructed by your doctor. These medicines may hide a fever. Do not become pregnant while taking this medicine. Women should inform their doctor if they wish to become pregnant or think they might be pregnant. There is a potential for serious side effects to an unborn child. Talk to your health care professional or pharmacist for more information. Do not breast-feed an infant while taking this medicine. Men are advised not to father a child while receiving this medicine. This product may contain alcohol. Ask your pharmacist or healthcare  provider if this medicine contains alcohol. Be sure to tell all healthcare providers you are taking this medicine. Certain medicines, like metronidazole and disulfiram, can cause an unpleasant reaction when taken with alcohol. The reaction includes flushing, headache, nausea, vomiting, sweating, and increased thirst. The reaction can last from 30 minutes to several hours. What side effects may I notice from receiving this medicine? Side effects that you should report to your doctor or health care professional as soon as possible:  allergic reactions like skin rash, itching or hives, swelling of the face, lips, or tongue  breathing problems  changes in vision  fast, irregular heartbeat  high or low blood pressure  mouth sores  pain, tingling, numbness in the hands or feet  signs of decreased platelets or bleeding - bruising, pinpoint red spots on the skin, black, tarry stools, blood in the urine  signs of decreased red blood cells - unusually weak or tired, feeling faint or lightheaded, falls  signs of infection - fever or chills, cough, sore throat, pain or difficulty passing urine  signs and symptoms of liver injury like dark yellow or brown urine; general ill feeling or flu-like symptoms; light-colored stools; loss of   appetite; nausea; right upper belly pain; unusually weak or tired; yellowing of the eyes or skin  swelling of the ankles, feet, hands  unusually slow heartbeat Side effects that usually do not require medical attention (report to your doctor or health care professional if they continue or are bothersome):  diarrhea  hair loss  loss of appetite  muscle or joint pain  nausea, vomiting  pain, redness, or irritation at site where injected  tiredness This list may not describe all possible side effects. Call your doctor for medical advice about side effects. You may report side effects to FDA at 1-800-FDA-1088. Where should I keep my medicine? This drug is  given in a hospital or clinic and will not be stored at home. NOTE: This sheet is a summary. It may not cover all possible information. If you have questions about this medicine, talk to your doctor, pharmacist, or health care provider.  2020 Elsevier/Gold Standard (2017-01-06 13:14:55)  Carboplatin injection What is this medicine? CARBOPLATIN (KAR boe pla tin) is a chemotherapy drug. It targets fast dividing cells, like cancer cells, and causes these cells to die. This medicine is used to treat ovarian cancer and many other cancers. This medicine may be used for other purposes; ask your health care provider or pharmacist if you have questions. COMMON BRAND NAME(S): Paraplatin What should I tell my health care provider before I take this medicine? They need to know if you have any of these conditions:  blood disorders  hearing problems  kidney disease  recent or ongoing radiation therapy  an unusual or allergic reaction to carboplatin, cisplatin, other chemotherapy, other medicines, foods, dyes, or preservatives  pregnant or trying to get pregnant  breast-feeding How should I use this medicine? This drug is usually given as an infusion into a vein. It is administered in a hospital or clinic by a specially trained health care professional. Talk to your pediatrician regarding the use of this medicine in children. Special care may be needed. Overdosage: If you think you have taken too much of this medicine contact a poison control center or emergency room at once. NOTE: This medicine is only for you. Do not share this medicine with others. What if I miss a dose? It is important not to miss a dose. Call your doctor or health care professional if you are unable to keep an appointment. What may interact with this medicine?  medicines for seizures  medicines to increase blood counts like filgrastim, pegfilgrastim, sargramostim  some antibiotics like amikacin, gentamicin, neomycin,  streptomycin, tobramycin  vaccines Talk to your doctor or health care professional before taking any of these medicines:  acetaminophen  aspirin  ibuprofen  ketoprofen  naproxen This list may not describe all possible interactions. Give your health care provider a list of all the medicines, herbs, non-prescription drugs, or dietary supplements you use. Also tell them if you smoke, drink alcohol, or use illegal drugs. Some items may interact with your medicine. What should I watch for while using this medicine? Your condition will be monitored carefully while you are receiving this medicine. You will need important blood work done while you are taking this medicine. This drug may make you feel generally unwell. This is not uncommon, as chemotherapy can affect healthy cells as well as cancer cells. Report any side effects. Continue your course of treatment even though you feel ill unless your doctor tells you to stop. In some cases, you may be given additional medicines to help with side   effects. Follow all directions for their use. Call your doctor or health care professional for advice if you get a fever, chills or sore throat, or other symptoms of a cold or flu. Do not treat yourself. This drug decreases your body's ability to fight infections. Try to avoid being around people who are sick. This medicine may increase your risk to bruise or bleed. Call your doctor or health care professional if you notice any unusual bleeding. Be careful brushing and flossing your teeth or using a toothpick because you may get an infection or bleed more easily. If you have any dental work done, tell your dentist you are receiving this medicine. Avoid taking products that contain aspirin, acetaminophen, ibuprofen, naproxen, or ketoprofen unless instructed by your doctor. These medicines may hide a fever. Do not become pregnant while taking this medicine. Women should inform their doctor if they wish to become  pregnant or think they might be pregnant. There is a potential for serious side effects to an unborn child. Talk to your health care professional or pharmacist for more information. Do not breast-feed an infant while taking this medicine. What side effects may I notice from receiving this medicine? Side effects that you should report to your doctor or health care professional as soon as possible:  allergic reactions like skin rash, itching or hives, swelling of the face, lips, or tongue  signs of infection - fever or chills, cough, sore throat, pain or difficulty passing urine  signs of decreased platelets or bleeding - bruising, pinpoint red spots on the skin, black, tarry stools, nosebleeds  signs of decreased red blood cells - unusually weak or tired, fainting spells, lightheadedness  breathing problems  changes in hearing  changes in vision  chest pain  high blood pressure  low blood counts - This drug may decrease the number of white blood cells, red blood cells and platelets. You may be at increased risk for infections and bleeding.  nausea and vomiting  pain, swelling, redness or irritation at the injection site  pain, tingling, numbness in the hands or feet  problems with balance, talking, walking  trouble passing urine or change in the amount of urine Side effects that usually do not require medical attention (report to your doctor or health care professional if they continue or are bothersome):  hair loss  loss of appetite  metallic taste in the mouth or changes in taste This list may not describe all possible side effects. Call your doctor for medical advice about side effects. You may report side effects to FDA at 1-800-FDA-1088. Where should I keep my medicine? This drug is given in a hospital or clinic and will not be stored at home. NOTE: This sheet is a summary. It may not cover all possible information. If you have questions about this medicine, talk to  your doctor, pharmacist, or health care provider.  2020 Elsevier/Gold Standard (2007-08-10 14:38:05)     

## 2018-12-27 NOTE — Telephone Encounter (Signed)
Spoke with the patient and she is now scheduled for a port placement on 12/29/2018 with Dr. Delana Meyer with a 6:45 am arrival time to the [facility]. Patient will do her Covid testing on 12/28/2018 at 8:00 am at the [facility]. Unable to print the pre-procedure instructions, this was discussed in detail.

## 2018-12-27 NOTE — Telephone Encounter (Signed)
I have made the 1st attempt to contact the patient or family member in charge, in order to follow up from recently being discharged from the hospital. I left a message on voicemail requesting a CB. -MM 

## 2018-12-27 NOTE — Telephone Encounter (Signed)
Transition Care Management Follow-up Telephone Call  Date of discharge and from where: [facility] on 12/25/28.  How have you been since you were released from the hospital? Doing better today, yesterday was rough due to movement with being SOB and hip pain. Has had some improvement with pain and oxygen today, unless pt gets up or moves around. Declines any other s/s.  Any questions or concerns? Yes, just home health requests. See other phone encounter.   Items Reviewed:  Did the pt receive and understand the discharge instructions provided? Yes   Medications obtained and verified? No, pt declined reviewing over the phone at this time.   Any new allergies since your discharge? No   Dietary orders reviewed? Yes  Do you have support at home? Yes   Other (ie: DME, Home Health, etc) N/A  Functional Questionnaire: (I = Independent and D = Dependent)  Bathing/Dressing- D, husband helps with bathing.   Meal Prep- D currently  Eating- I  Maintaining continence- I  Transferring/Ambulation- Using a cane for assistance.   Managing Meds- I   Follow up appointments reviewed:    PCP Hospital f/u appt confirmed? Yes  Scheduled to see Fenton Malling on 12/29/18 @ 11:00 AM.  [facility] f/u appt confirmed? Yes    Are transportation arrangements needed? No   If their condition worsens, is the pt aware to call  their PCP or go to the ED? Yes  Was the patient provided with contact information for the PCP's office or ED? Yes  Was the pt encouraged to call back with questions or concerns? Yes

## 2018-12-27 NOTE — Telephone Encounter (Signed)
No HFU scheduled.  

## 2018-12-27 NOTE — Progress Notes (Signed)
START ON PATHWAY REGIMEN - Non-Small Cell Lung     A cycle is every 21 days:     Pembrolizumab      Paclitaxel      Carboplatin   **Always confirm dose/schedule in your pharmacy ordering system**  Patient Characteristics: Stage IV Metastatic, Squamous, PS = 0, 1, First Line, PD-L1 Expression Positive 1-49% (TPS) / Negative / Not Tested / Awaiting Test Results and Immunotherapy Candidate AJCC T Category: T4 Current Disease Status: Distant Metastases AJCC N Category: N3 AJCC M Category: M1c AJCC 8 Stage Grouping: IVB Histology: Squamous Cell Line of therapy: First Line ECOG Performance Status: 1 PD-L1 Expression Status: Awaiting Test Results Immunotherapy Candidate Status: Candidate for Immunotherapy Intent of Therapy: Non-Curative / Palliative Intent, Discussed with Patient

## 2018-12-27 NOTE — Telephone Encounter (Signed)
Needing to see if Elizabeth [name] could assist her with getting some in home health care for bathing. Also wanting to discuss what else home health can do?  Please advise.  Thanks, [employer]

## 2018-12-27 NOTE — Telephone Encounter (Signed)
Patient returned your call.

## 2018-12-27 NOTE — Telephone Encounter (Signed)
Called patient but went straight to VM. I have placed [facility] referral for them to come and evaluate. I have ordered nursing, PT and OT to come evaluate her.

## 2018-12-28 ENCOUNTER — Other Ambulatory Visit (INDEPENDENT_AMBULATORY_CARE_PROVIDER_SITE_OTHER): Payer: Self-pay | Admitting: Nurse Practitioner

## 2018-12-28 ENCOUNTER — Ambulatory Visit
Admission: RE | Admit: 2018-12-28 | Discharge: 2018-12-28 | Disposition: A | Discharge status: Home / Self Care | Payer: Medicare Other | Source: Ambulatory Visit | Attending: Radiation Oncology | Admitting: Radiation Oncology

## 2018-12-28 ENCOUNTER — Telehealth: Payer: Self-pay | Admitting: Physician Assistant

## 2018-12-28 ENCOUNTER — Other Ambulatory Visit
Admission: RE | Admit: 2018-12-28 | Discharge: 2018-12-28 | Disposition: A | Discharge status: Home / Self Care | Payer: Medicare Other | Source: Ambulatory Visit | Attending: Vascular Surgery | Admitting: Vascular Surgery

## 2018-12-28 ENCOUNTER — Other Ambulatory Visit: Payer: Self-pay

## 2018-12-28 ENCOUNTER — Inpatient Hospital Stay: Payer: Medicare Other | Admitting: Oncology

## 2018-12-28 ENCOUNTER — Inpatient Hospital Stay: Payer: Medicare Other | Attending: Oncology

## 2018-12-28 ENCOUNTER — Telehealth: Payer: Self-pay

## 2018-12-28 DIAGNOSIS — Z510 Encounter for antineoplastic radiation therapy: Secondary | ICD-10-CM | POA: Diagnosis present

## 2018-12-28 DIAGNOSIS — Z20828 Contact with and (suspected) exposure to other viral communicable diseases: Secondary | ICD-10-CM | POA: Insufficient documentation

## 2018-12-28 DIAGNOSIS — C3490 Malignant neoplasm of unspecified part of unspecified bronchus or lung: Secondary | ICD-10-CM | POA: Diagnosis not present

## 2018-12-28 DIAGNOSIS — Z01812 Encounter for preprocedural laboratory examination: Secondary | ICD-10-CM | POA: Insufficient documentation

## 2018-12-28 DIAGNOSIS — C7931 Secondary malignant neoplasm of brain: Secondary | ICD-10-CM | POA: Diagnosis not present

## 2018-12-28 LAB — SARS CORONAVIRUS 2 BY RT PCR (HOSPITAL ORDER, PERFORMED IN ~~LOC~~ HOSPITAL LAB): SARS Coronavirus 2: NEGATIVE

## 2018-12-28 NOTE — Telephone Encounter (Signed)
Please advise order for wheelchair?

## 2018-12-28 NOTE — Telephone Encounter (Signed)
FYI: RN Maia Breslow from [facility] called to inform Tawanna Sat pt was only given Xanax 0.5 mg while she was an in patient. When pt was discharged home, Xanax 0.5 mg was not prescribed. Maia Breslow just wanted to let Tawanna Sat know if pt needs Xanax, her provider will have to prescribe it. Maia Breslow notified patient's daughter.

## 2018-12-28 NOTE — Progress Notes (Deleted)
[place]  Telephone:([phone] [phone]  Patient Care Team: Mar Daring, PA-C as PCP - General (Family Medicine) Dasher, Rayvon Char, MD as Consulting Physician (Dermatology) Yolonda Kida, MD as Consulting Physician (Cardiology) Dingeldein, Remo Lipps, MD as Consulting Physician (Ophthalmology) Kipp Laurence, MD as Referring Physician (Audiology) Grant Fontana, [place] as Referring Physician (Chiropractic Medicine) Watt Climes, PA as Physician Assistant (Physician Assistant) Telford Nab, RN as Registered Nurse   Name of the patient: Elizabeth [name]  [id number]  November 25, 1945   Date of visit: 12/28/18  Diagnosis- ***  Chief complaint/Reason for visit- Initial Meeting for [facility], preparing for starting chemotherapy  Heme/Onc history:  Oncology History  Squamous cell carcinoma of right lung ([place])  12/24/2018 Initial Diagnosis   Squamous cell carcinoma of right lung ([place])   01/03/2019 -  Chemotherapy   The patient had PALONOSETRON HCL INJECTION 0.25 MG/5ML, 0.25 mg, Intravenous,  Once, 0 of 4 cycles pegfilgrastim-jmdb (FULPHILA) injection 6 mg, 6 mg, Subcutaneous,  Once, 0 of 4 cycles CARBOplatin (PARAPLATIN) in sodium chloride 0.9 % 100 mL chemo infusion, , Intravenous,  Once, 0 of 4 cycles PACLitaxel (TAXOL) 402 mg in sodium chloride 0.9 % 500 mL chemo infusion (> 87m/m2), 200 mg/m2, Intravenous,  Once, 0 of 4 cycles pembrolizumab (KEYTRUDA) 200 mg in sodium chloride 0.9 % 50 mL chemo infusion, 200 mg, Intravenous, Once, 0 of 5 cycles FOSAPREPITANT 150MG + DEXAMETHASONE INFUSION [facility], , Intravenous,  Once, 0 of 4 cycles  for chemotherapy treatment.      Interval history-  *** who presents to chemo care clinic today for initial meeting in preparation for starting chemotherapy. I introduced the chemo care clinic and we discussed that the role of the clinic is to assist those who are at an increased  risk of emergency room visits and/or complications during the course of chemotherapy treatment. We discussed that the increased risk takes into account factors such as age, performance status, and co-morbidities. We also discussed that for some, this might include barriers to care such as not having a primary care provider, lack of insurance/transportation, or not being able to afford medications. We discussed that the goal of the program is to help prevent unplanned ER visits and help reduce complications during chemotherapy. We do this by discussing specific risk factors to each individual and identifying ways that we can help improve these risk factors and reduce barriers to care.   ECOG FS:{CHL ONC [id number]} Review of systems- ROS   Current treatment- ***  Allergies  Allergen Reactions   Furadantin [Nitrofurantoin]     URI   Phenobarbital Rash    Past Medical History:  Diagnosis Date   Allergy    Cancer ([place] 09/2014   Skin Cancer   Hyperlipidemia    Hypertension     Past Surgical History:  Procedure Laterality Date   ABDOMINAL HYSTERECTOMY  1989   due to menorrhagia   COLONOSCOPY     [facility] [place]  SKIN CANCER EXCISION  10/2014   on chest; Dr. DEvorn Gong  TRIGGER FINGER RELEASE Right 2000   WRIST ARTHROPLASTY Left 1991    Social History   Socioeconomic History   Marital status: Married    Spouse name: GEulas Post  Number of children: 2   Years of education: Not on file   Highest education level: Bachelor's degree (e.g., BA, AB, BS)  Occupational History   Occupation:  retired  Scientist, product/process development strain: Not hard at [employer]    Worry: Never true    Inability: Never true   Transportation needs    Medical: No    Non-medical: No  Tobacco Use   Smoking status: Former Smoker    Packs/day: 0.50    Years: 35.00    Pack years: 17.50    Quit date: 05/20/2003    Years since quitting: 15.6    Smokeless tobacco: Never Used  Substance and Sexual Activity   Alcohol use: Yes    Alcohol/week: 14.0 standard drinks    Types: 14 Glasses of wine per week    Comment: occasional; 12/16/18 - states no alcohol since 12/02/18    Drug use: No   Sexual activity: Not on file  Lifestyle   Physical activity    Days per week: 0 days    Minutes per session: 0 min   Stress: Not at all  Relationships   Social connections    Talks on phone: Patient refused    Gets together: Patient refused    Attends religious service: Patient refused    Active member of club or organization: Patient refused    Attends meetings of clubs or organizations: Patient refused    Relationship status: Patient refused   Intimate partner violence    Fear of current or ex partner: Patient refused    Emotionally abused: Patient refused    Physically abused: Patient refused    Forced sexual activity: Patient refused  Other Topics Concern   Not on file  Social History Narrative   Not on file    Family History  Problem Relation Age of Onset   Arthritis Mother    Cerebrovascular Disease Mother    Heart disease Mother    Vascular Disease Mother    Hypertension Father    COPD Father    Throat cancer Father    Melanoma Father    Prostate cancer Father    Skin cancer Brother    Skin cancer Brother    Breast cancer Paternal Aunt 40     Current Outpatient Medications:    albuterol (VENTOLIN HFA) 108 (90 Base) MCG/ACT inhaler, Inhale 2 puffs into the lungs every 6 (six) hours as needed for wheezing or shortness of breath., Disp: 18 g, Rfl: 0   allopurinol (ZYLOPRIM) 300 MG tablet, Take 1 tablet (300 mg total) by mouth daily., Disp: 30 tablet, Rfl: 0   apixaban (ELIQUIS) 5 MG TABS tablet, Take 10 mg PO BID X 6 days and then start 5 mg PO BID thereafter. (Patient taking differently: Take 5 mg by mouth 2 (two) times daily. ), Disp: 60 tablet, Rfl: 0   benzonatate (TESSALON) 200 MG capsule, Take  1 capsule (200 mg total) by mouth 3 (three) times daily as needed., Disp: 30 capsule, Rfl: 0   cetirizine (ZYRTEC) 10 MG tablet, TAKE 1 TABLET BY MOUTH ONCE A DAY, Disp: 90 tablet, Rfl: 1   cholecalciferol (VITAMIN D) 1000 units tablet, Take 1,000 Units by mouth daily., Disp: , Rfl:    CINNAMON PO, Take by mouth daily. , Disp: , Rfl:    dexamethasone (DECADRON) 4 MG tablet, Take 2 tablets by mouth once a day on the day after chemotherapy and then take 2 tablets two times a day for 2 days. Take with food., Disp: 30 tablet, Rfl: 1   fentaNYL (DURAGESIC) 12 MCG/HR, Place 1 patch onto the skin every 3 (three) days.,  Disp: 10 patch, Rfl: 0   fluticasone (FLONASE) 50 MCG/ACT nasal spray, PLACE 2 SPRAYS INTO BOTH NOSTRILS DAILY, Disp: 48 g, Rfl: 1   Fluticasone-Salmeterol (ADVAIR DISKUS) 250-50 MCG/DOSE AEPB, INHALE 1 PUFF INTO LUNGS EVERY 12 HOURS (TWICE DAILY) RINSE MOUTH AFTER USE. (Patient taking differently: as needed. INHALE 1 PUFF INTO LUNGS EVERY 12 HOURS (TWICE DAILY) RINSE MOUTH AFTER USE), Disp: 60 each, Rfl: 5   folic acid (FOLVITE) 1 MG tablet, Take 1 mg by mouth daily. , Disp: , Rfl:    gabapentin (NEURONTIN) 300 MG capsule, Take 1 capsule (300 mg total) by mouth 3 (three) times daily., Disp: 90 capsule, Rfl: 0   Ginger, Zingiber officinalis, (GINGER PO), Take 1 capsule by mouth daily. , Disp: , Rfl:    HYDROcodone-acetaminophen (NORCO) 5-325 MG tablet, Take 1 tablet by mouth every 6 (six) hours as needed for moderate pain., Disp: 30 tablet, Rfl: 0   levofloxacin (LEVAQUIN) 500 MG tablet, Take 1 tablet (500 mg total) by mouth daily for 5 days., Disp: 5 tablet, Rfl: 0   lidocaine-prilocaine (EMLA) cream, Apply to affected area once, Disp: 30 g, Rfl: 3   methotrexate 2.5 MG tablet, Take 7.5 mg by mouth once a week. , Disp: , Rfl:    metoprolol succinate (TOPROL-XL) 50 MG 24 hr tablet, TAKE 1 TABLET BY MOUTH TWICE A DAY WITH OR IMMEDIATELY FOLLOWING A MEAL (Patient taking  differently: Take 50 mg by mouth 2 (two) times daily. ), Disp: 180 tablet, Rfl: 3   Misc Natural Products (HERBAL ENERGY COMPLEX PO), Take 1 capsule by mouth daily. , Disp: , Rfl:    montelukast (SINGULAIR) 10 MG tablet, TAKE 1 TABLET BY MOUTH ONCE DAILY, Disp: 90 tablet, Rfl: 3   Multiple Vitamin tablet, Take 1 tablet by mouth daily. , Disp: , Rfl:    Multiple Vitamins-Minerals (PRESERVISION AREDS PO), Take by mouth daily. , Disp: , Rfl:    Multiple Vitamins-Minerals (ZINC PO), Take by mouth daily. , Disp: , Rfl:    ondansetron (ZOFRAN) 8 MG tablet, Take 1 tablet (8 mg total) by mouth 2 (two) times daily as needed. Start on the third day after chemotherapy., Disp: 30 tablet, Rfl: 1   polyethylene glycol (MIRALAX / GLYCOLAX) 17 g packet, Take 17 g by mouth daily as needed for mild constipation., Disp: 14 each, Rfl: 0   Probiotic Product (PROBIOTIC-10 PO), Take by mouth daily., Disp: , Rfl:    prochlorperazine (COMPAZINE) 10 MG tablet, Take 1 tablet (10 mg total) by mouth every 6 (six) hours as needed (Nausea or vomiting)., Disp: 30 tablet, Rfl: 1   tiZANidine (ZANAFLEX) 4 MG tablet, TAKE 1 TABLET BY MOUTH 3 TIMES DAILY AS NEEDED FOR MUSCLE SPASMS (Patient taking differently: Take 4 mg by mouth every 8 (eight) hours as needed for muscle spasms. ), Disp: 90 tablet, Rfl: 3   TURMERIC PO, Take by mouth daily. , Disp: , Rfl:    vitamin E 1000 UNIT capsule, Take 1,000 Units by mouth daily., Disp: , Rfl:   Physical exam: There were no vitals filed for this visit. Physical Exam   CMP Latest Ref Rng & Units 12/22/2018  Glucose 70 - 99 mg/dL 102(H)  BUN 8 - 23 mg/dL 20  Creatinine 0.44 - 1.00 mg/dL 0.48  Sodium 135 - 145 mmol/L 137  Potassium 3.5 - 5.1 mmol/L 3.7  Chloride 98 - 111 mmol/L 102  CO2 22 - 32 mmol/L 25  Calcium 8.9 - 10.3 mg/dL 9.8  Total  Protein 6.5 - 8.1 g/dL -  Total Bilirubin 0.3 - 1.2 mg/dL -  Alkaline Phos 38 - 126 U/L -  AST 15 - 41 U/L -  ALT 0 - 44 U/L -   CBC  Latest Ref Rng & Units 12/25/2018  WBC 4.0 - 10.5 K/uL 14.3(H)  Hemoglobin 12.0 - 15.0 g/dL 12.8  Hematocrit 36.0 - 46.0 % 40.3  Platelets 150 - 400 K/uL 250    No images are attached to the encounter.  Dg Chest 2 View  Result Date: 12/17/2018 CLINICAL DATA:  History of cancer. Former smoker. Shortness of breath. EXAM: CHEST - 2 VIEW COMPARISON:  03/20/2016.  06/28/2012. FINDINGS: Right hilar fullness. Diffuse bilateral interstitial prominence. Multiple bilateral pulmonary nodular densities. These findings suggest malignancy with possible right hilar adenopathy, interstitial tumor spread, and metastatic disease. An inflammatory or infectious process including active granulomatous disease could also present in this fashion. Contrast-enhanced chest CT should be considered for further evaluation. No pleural effusion or pneumothorax. Heart size normal. Thoracic spine scoliosis. IMPRESSION: Right hilar fullness, diffuse bilateral interstitial prominence, and multiple bilateral pulmonary nodular densities. These findings suggest malignancy with possible right hilar adenopathy, interstitial tumor spread, and metastatic disease. An inflammatory or infectious process including active granulomatous disease could also present this fashion. Contrast-enhanced chest CT should be considered for further evaluation. Electronically Signed   By: Marcello Moores  Register   On: 12/17/2018 08:14   Ct Chest W Contrast  Result Date: 12/17/2018 CLINICAL DATA:  Follow-up exam. History of metastasis and lung nodules. EXAM: CT CHEST, ABDOMEN, AND PELVIS WITH CONTRAST TECHNIQUE: Multidetector CT imaging of the chest, abdomen and pelvis was performed following the standard protocol during bolus administration of intravenous contrast. CONTRAST:  162m OMNIPAQUE IOHEXOL 300 MG/ML  SOLN COMPARISON:  Chest radiograph 12/16/2018; MRI lumbar spine 12/02/2018 FINDINGS: CT CHEST FINDINGS Cardiovascular: Normal heart size. Trace fluid superior  pericardial recess. Thoracic aortic vascular calcifications. Mediastinum/Nodes: No axillary lymphadenopathy. There is a 1.9 cm centrally necrotic precarinal node (image 22; series 2). There is a 1.7 cm right hilar node (image 27; series 2). There is a 1.9 cm left hilar node (image 30; series 2). There is a 1.2 cm centrally necrotic nodule along the posterior left pericardium (image 38; series 2). Mild wall thickening of the esophagus. Lungs/Pleura: Central airways are patent. There is a dominant centrally necrotic 6.2 x 5.9 cm mass within the right lower lobe (image 68; series 4). There is a 2.9 cm subpleural left lower lobe nodule (image 138; series 4). There are multiple bilateral pulmonary nodules. Reference 1.7 cm left lower lobe nodule (image 115; series 4). Reference 0.8 cm left upper lobe nodule (image 42; series 4). Reference 1.5 cm right middle lobe nodule (image 66; series 4). 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 There is a moderate right pleural effusion. Musculoskeletal: No aggressive or acute appearing osseous lesions. CT ABDOMEN PELVIS FINDINGS Hepatobiliary: There are innumerable low-attenuation lesions throughout the left and right hepatic lobes, the majority are subcentimeter in size. Gallbladder is unremarkable. No intrahepatic or extrahepatic biliary ductal dilatation. Pancreas: Unremarkable Spleen: Unremarkable Adrenals/Urinary Tract: Normal adrenal glands. Kidneys enhance symmetrically with contrast. Subcentimeter too small to characterize low-attenuation lesion inferior pole left kidney (image 77; series 2). Urinary bladder is decompressed. Stomach/Bowel: Stool throughout the colon. No abnormal bowel wall thickening or evidence  for bowel obstruction. No free fluid or free intraperitoneal air.  Normal morphology of the stomach. Vascular/Lymphatic: Normal caliber abdominal aorta. Peripheral calcified atherosclerotic plaque. 1.3 cm left periaortic lymph node (image 71; series 2). 1.1 cm aortocaval node (image 80; series 2). There is a 1.0 cm porta hepatic node (image 65; series 2). Reproductive: Prior hysterectomy. Adnexal structures are unremarkable. Other: None. Musculoskeletal: Lumbar spine degenerative changes. There is a 6.7 x 5.7 cm lytic soft tissue mass involving the right hemi sacrum (image 93; series 2) with soft tissue involving the sacral spinal canal. There is a 2.4 cm soft tissue lytic lesion within the posterior right ilium (image 92; series 2). Additional lytic lesions within the right ilium. IMPRESSION: 1. Large centrally necrotic right lower lobe mass concerning the possibility of primary pulmonary malignancy. 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 2. Multiple enlarged and necrotic mediastinal and hilar nodes most compatible with nodal metastasis. 3. Enlarged retroperitoneal nodes compatible with metastasis. 4. Innumerable low-attenuation lesions within liver compatible metastasis. 5. Large lytic mass involving the right hemi sacrum as well as smaller lytic lesions involving the right ilium consistent with osseous metastasis. Electronically Signed   By: Lovey Newcomer M.D.   On: 12/17/2018 16:47   Ct Angio Chest Pe W Or Wo Contrast  Result Date: 12/20/2018 CLINICAL DATA:  Acute onset of tachycardia and shortness of breath. Recent diagnosis lung cancer EXAM: CT ANGIOGRAPHY CHEST WITH CONTRAST TECHNIQUE: Multidetector CT imaging of the chest was performed using the standard protocol during bolus administration of intravenous contrast. Multiplanar CT image reconstructions and MIPs were obtained to evaluate the vascular anatomy. CONTRAST:  75m  OMNIPAQUE IOHEXOL 350 MG/ML SOLN COMPARISON:  12/17/2018 FINDINGS: Cardiovascular: Normal heart size. Trace pericardial fluid or thickening-stable. Possible hypertrophic left ventricle. Multiple new branching pulmonary artery filling defects that are segmental to subsegmental and demarcated on series 5. The most proximal filling defects are seen and segmental right upper lobe branches Mediastinum/Nodes: Known low-density adenopathy in the mediastinum better depicted on parenchymal phase CT performed previously. Lungs/Pleura: Known right lower lobe mass with central low-density and pulmonary metastases on both sides. 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 Small right pleural effusion. Upper Abdomen: Extensive hepatic metastatic disease. Musculoskeletal: No acute finding. Please see Z vision log concerning efforts to reach on-call physician. Overhead paging is disallowed. Critical Value/emergent results were called by telephone at the time of interpretation on 12/20/2018 at 6:51 am to Dr. MSidney Ace who verbally acknowledged these results. Review of the MIP images confirms the above findings. IMPRESSION: 1. Positive for multifocal segmental and subsegmental acute pulmonary emboli.No indication of right heart strain. 2. Extensive thoracic and intra-abdominal malignancy as recently staged. Electronically Signed   By: JMonte FantasiaM.D.   On: 12/20/2018 06:51   Mr BJeri CosWAQContrast  Result Date: 12/21/2018 CLINICAL DATA:  Non-small-cell lung cancer staging EXAM: MRI HEAD WITHOUT AND WITH CONTRAST TECHNIQUE: Multiplanar, multiecho pulse sequences of the brain and surrounding structures were obtained without and with intravenous contrast. CONTRAST:  8 mL Gadovist IV COMPARISON:  None. FINDINGS: Brain: Image quality degraded by motion especially the postcontrast images. Multiple enhancing lesions the brain compatible with metastatic disease. Axial images significantly degraded by  motion. Coronal and sagittal images have less motion. 8 mm enhancing lesion right occipital lobe 6 mm enhancing lesion right posterior temporal lobe above the tentorium 9 mm lesion right  parietal cortex 3 mm lesion right parietal lobe 5 mm left inferior temporal lobe 5 mm right frontal lesion 5 mm left frontal lesion. No significant brain edema or midline shift. No acute infarct. Negative for hemorrhage Vascular: Normal arterial flow voids Skull and upper cervical spine: No focal skeletal lesion. 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 All less than 1 cm. No significant edema. Image quality degraded by motion which could limit detection of small lesions. Electronically Signed   By: Franchot Gallo M.D.   On: 12/21/2018 11:59   Mr Lumbar Spine Wo Contrast  Result Date: 12/03/2018 CLINICAL DATA:  Sciatica right side. Right back and leg pain 4-5 months. EXAM: MRI LUMBAR SPINE WITHOUT CONTRAST TECHNIQUE: Multiplanar, multisequence MR imaging of the lumbar spine was performed. No intravenous contrast was administered. COMPARISON:  None. FINDINGS: Segmentation:  Normal Alignment:  4 mm anterolisthesis L4-5.  Mild dextroscoliosis. Vertebrae: 1 cm hyperintense lesion in the left posterior T12 vertebral body best seen on inversion recovery. Infiltrating mass lesion in the right sacrum involving the right sacrum. Tumor involves the right S1 vertebral body and pedicle and extends into the right S2 vertebral body. Tumor is low signal on T1 and hyperintense on T2. Extraosseous tumor with narrowing the foramen at L5-S1 and S1-2. Tumor also infiltrates the posterior iliac bone adjacent to the SI joint. Small right SI joint effusion. Conus medullaris and cauda equina: Conus extends to the L1-2 level. Conus and cauda equina appear normal. Paraspinal and other soft tissues: Negative for retroperitoneal adenopathy. Disc levels: L1-2: Mild disc and facet degeneration without  stenosis L2-3: Moderate disc degeneration with disc bulging and spurring. Bilateral facet hypertrophy and mild spinal stenosis. L3-4: Disc degeneration and spondylosis. Bilateral facet hypertrophy. Moderate spinal stenosis. Mild to moderate subarticular stenosis bilaterally L4-5: 4 mm anterolisthesis. Diffuse bulging of the disc with moderate to severe facet degeneration. Moderate to severe spinal stenosis. Mild subarticular stenosis bilaterally L5-S1: Bilateral facet hypertrophy. Right foraminal encroachment due to tumor extension and spurring. 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 Tumor also infiltrates right posterior iliac bone. 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 Right L5 and S1 foraminal encroachment due to tumor extension into the soft tissues. Small lesion T12 also supportive of metastatic disease. Chondrosarcoma or lymphoma right sacrum less likely. These results were called by telephone at the time of interpretation on 12/03/2018 at 10:27 am to [facility] , who verbally acknowledged these results. Electronically Signed   By: Franchot Gallo M.D.   On: 12/03/2018 10:36   Ct Abdomen Pelvis W Contrast  Result Date: 12/17/2018 CLINICAL DATA:  Follow-up exam. History of metastasis and lung nodules. EXAM: CT CHEST, ABDOMEN, AND PELVIS WITH CONTRAST TECHNIQUE: Multidetector CT imaging of the chest, abdomen and pelvis was performed following the standard protocol during bolus administration of intravenous contrast. CONTRAST:  131m OMNIPAQUE IOHEXOL 300 MG/ML  SOLN COMPARISON:  Chest radiograph 12/16/2018; MRI lumbar spine 12/02/2018 FINDINGS: CT CHEST FINDINGS Cardiovascular: Normal heart size. Trace fluid superior pericardial recess. Thoracic aortic vascular calcifications. Mediastinum/Nodes: No axillary lymphadenopathy. There is a 1.9 cm centrally necrotic precarinal node (image 22; series 2). There is a 1.7 cm  right hilar node (image 27; series 2). There is a 1.9 cm left hilar node (image 30; series 2). There is a 1.2 cm centrally necrotic nodule along the posterior left pericardium (image 38; series 2). Mild wall thickening of the esophagus. Lungs/Pleura: Central  airways are patent. There is a dominant centrally necrotic 6.2 x 5.9 cm mass within the right lower lobe (image 68; series 4). There is a 2.9 cm subpleural left lower lobe nodule (image 138; series 4). There are multiple bilateral pulmonary nodules. Reference 1.7 cm left lower lobe nodule (image 115; series 4). Reference 0.8 cm left upper lobe nodule (image 42; series 4). Reference 1.5 cm right middle lobe nodule (image 66; series 4). 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 There is a moderate right pleural effusion. Musculoskeletal: No aggressive or acute appearing osseous lesions. CT ABDOMEN PELVIS FINDINGS Hepatobiliary: There are innumerable low-attenuation lesions throughout the left and right hepatic lobes, the majority are subcentimeter in size. Gallbladder is unremarkable. No intrahepatic or extrahepatic biliary ductal dilatation. Pancreas: Unremarkable Spleen: Unremarkable Adrenals/Urinary Tract: Normal adrenal glands. Kidneys enhance symmetrically with contrast. Subcentimeter too small to characterize low-attenuation lesion inferior pole left kidney (image 77; series 2). Urinary bladder is decompressed. Stomach/Bowel: Stool throughout the colon. No abnormal bowel wall thickening or evidence for bowel obstruction. No free fluid or free intraperitoneal air. Normal morphology of the stomach. Vascular/Lymphatic: Normal caliber abdominal aorta. Peripheral calcified atherosclerotic plaque. 1.3 cm left periaortic lymph node (image 71; series 2). 1.1 cm aortocaval  node (image 80; series 2). There is a 1.0 cm porta hepatic node (image 65; series 2). Reproductive: Prior hysterectomy. Adnexal structures are unremarkable. Other: None. Musculoskeletal: Lumbar spine degenerative changes. There is a 6.7 x 5.7 cm lytic soft tissue mass involving the right hemi sacrum (image 93; series 2) with soft tissue involving the sacral spinal canal. There is a 2.4 cm soft tissue lytic lesion within the posterior right ilium (image 92; series 2). Additional lytic lesions within the right ilium. IMPRESSION: 1. Large centrally necrotic right lower lobe mass concerning the possibility of primary pulmonary malignancy. 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 2. Multiple enlarged and necrotic mediastinal and hilar nodes most compatible with nodal metastasis. 3. Enlarged retroperitoneal nodes compatible with metastasis. 4. Innumerable low-attenuation lesions within liver compatible metastasis. 5. Large lytic mass involving the right hemi sacrum as well as smaller lytic lesions involving the right ilium consistent with osseous metastasis. Electronically Signed   By: Lovey Newcomer M.D.   On: 12/17/2018 16:47   US Venous Img Lower Bilateral  Result Date: 12/20/2018 CLINICAL DATA:  Metastatic cancer, pulmonary emboli, anticoagulated EXAM: BILATERAL LOWER EXTREMITY VENOUS DOPPLER ULTRASOUND TECHNIQUE: Gray-scale sonography with graded compression, as well as color Doppler and duplex ultrasound were performed to evaluate the lower extremity deep venous systems from the level of the common femoral vein and including the common femoral, femoral, profunda femoral, popliteal and calf veins including the posterior tibial, peroneal and gastrocnemius veins when visible. The superficial great saphenous vein was also interrogated. Spectral Doppler was utilized to  evaluate flow at rest and with distal augmentation maneuvers in the common femoral, femoral and popliteal veins. COMPARISON:  None. FINDINGS: RIGHT LOWER EXTREMITY Common Femoral Vein: No evidence of thrombus. Normal compressibility, respiratory phasicity and response to augmentation. Saphenofemoral Junction: No evidence of thrombus. Normal compressibility and flow on color Doppler imaging. Profunda Femoral Vein: No evidence of thrombus. Normal compressibility and flow on color Doppler imaging. Femoral Vein: No evidence  of thrombus. Normal compressibility, respiratory phasicity and response to augmentation. Popliteal Vein: No evidence of thrombus. Normal compressibility, respiratory phasicity and response to augmentation. Calf Veins: No evidence of thrombus. Normal compressibility and flow on color Doppler imaging. Superficial Great Saphenous Vein: No evidence of thrombus. Normal compressibility. Venous Reflux:  Not assessed Other Findings:  None. LEFT LOWER EXTREMITY Common Femoral Vein: No evidence of thrombus. Normal compressibility, respiratory phasicity and response to augmentation. Saphenofemoral Junction: No evidence of thrombus. Normal compressibility and flow on color Doppler imaging. Profunda Femoral Vein: No evidence of thrombus. Normal compressibility and flow on color Doppler imaging. Femoral Vein: No evidence of thrombus. Normal compressibility, respiratory phasicity and response to augmentation. Popliteal Vein: No evidence of thrombus. Normal compressibility, respiratory phasicity and response to augmentation. Calf Veins: Left posterior tibial calf veins demonstrate intraluminal hypoechoic thrombus over a short segment. Vessel is noncompressible. Very low thrombus burden. Other calf veins appear patent. Superficial Great Saphenous Vein: No evidence of thrombus. Normal compressibility. Left small saphenous vein also demonstrates hypoechoic thrombus. Vessel is noncompressible. Very low thrombus burden.  Findings compatible with left small saphenous vein superficial thrombosis. Venous Reflux:  Not assessed Other Findings:  None. IMPRESSION: Negative for right lower extremity DVT. Positive for left calf posterior tibial DVT. Very low thrombus burden. No propagation into the popliteal or femoral veins. Associated left small saphenous vein superficial thrombosis/thrombophlebitis. Electronically Signed   By: Jerilynn Mages.  Shick M.D.   On: 12/20/2018 15:49   Ct Biopsy  Result Date: 12/22/2018 INDICATION: 73 year old with probable metastatic disease and needs a tissue diagnosis. Lytic lesion involving the posterior right ilium. EXAM: CT-GUIDED CORE BIOPSY OF RIGHT ILIAC BONE LESION MEDICATIONS: None. ANESTHESIA/SEDATION: Moderate (conscious) sedation was employed during this procedure. A total of Versed 1.0 mg and Fentanyl 50 mcg was administered intravenously. Moderate Sedation Time: 11 minutes. The patient's level of consciousness and vital signs were monitored continuously by radiology nursing throughout the procedure under my direct supervision. FLUOROSCOPY TIME:  None COMPLICATIONS: None immediate. PROCEDURE: Informed written consent was obtained from the patient after a thorough discussion of the procedural risks, benefits and alternatives. All questions were addressed. A timeout was performed prior to the initiation of the procedure. Patient was placed prone. CT images through the pelvis were obtained. The destructive lesion in the posterior right ilium was targeted for biopsy. The overlying skin was prepped with chlorhexidine and sterile field was created. Skin and soft tissues were anesthetized with 1% lidocaine. Using CT guidance, 17 gauge coaxial needle was easily directed through the cortex and into the soft tissue lesion within the bone. A total of 4 core biopsies were obtained with an 18 gauge device. Specimens placed in formalin. Needle was removed without complication. FINDINGS: Soft tissue lesions involving the  right sacrum and the posterior right ilium. Needle was easily advanced into the right posterior iliac bone lesion. Four soft tissue core biopsies were obtained. IMPRESSION: CT-guided core biopsies of the posterior right iliac bone lesion. Electronically Signed   By: Markus Daft M.D.   On: 12/22/2018 11:55     Assessment and plan- Patient is a 73 y.o. female who presents to [facility] for initial meeting in preparation for starting chemotherapy for the treatment of ***.    1. Cancer-   2. Chemo Care Clinic/High Risk for ER/Hospitalization during chemotherapy- We discussed the role of the chemo care clinic and identified patient specific risk factors. I discussed that patient was identified as high risk primarily based on: ***  Current  PCP-  Hospital Admissions-  ED Visits-  Medicaid:  Medicare:  In relationship:  Has anemia: Has asthma:  Has atrial fibrillation Has cvd: Has ckd:  Has copd: Has chf: Has connective tissue disorder: Has depression:  Has diabetes:  Has liver disease:  Has PVD:   3. Social Determinants of Health- we discussed that social determinants of health may have significant impacts on health and outcomes for cancer patients.  Today we discussed specific social determinants of performance status, alcohol use, depression, financial needs, food insecurity, housing, interpersonal violence, social connections, stress, tobacco use, and transportation.  After lengthy discussion the following were identified as areas of need:   Based on performance status/activity level we discussed options including home based and outpatient services, DME, and CARE program. We discusssed that patients who participate in regular physical activity report fewer negative impacts of cancer and treatments and report less fatigue.   Based on depression: we discussed self-referral to [facility] for counseling services, psychiatry for medication management, or palliative care/symptom  management as well as primary care providers.   Based on financial insecurity: We discussed that living with cancer can create tremendous financial burden.  We discussed options for assistance. I asked that if assistance is needed in affording medications or paying bills to please let us know so that we can provide assistance.   Based on food insecurity-we discussed options for food including social services.  We will also notify [facility] to see if cancer center can provide support.  Patient informed of food pantry at cancer center and was provided with care package today.  Please notify nursing if un-met needs.  Interpersonal Violence-   Based on alcohol use-   Based on housing insecurity: we discussed referral to social work. Will also discuss with Elease Etienne to see if [place] can provide support of utility bills, etc.   Based on lack of social connections-we discussed options for support groups at the cancer center. If interested, please notify nurse navigator to enroll.   Based on concern of stress-we discussed options for managing stress including healthy eating, exercise as well as participating in no charge counseling services at the cancer center and support groups.  If these are of interest, patient can notify either myself or primary nursing team.  Based on tobacco use-smoking cessation was encouraged.  We discussed options for management including medications and referral to quit Smart program  Based on transportation need: we discussed options for transportation including [employer]a, paratransit, bus routes, [employer] transit, taxi/uber/lyft, and cancer center van.  I have notified primary oncology team who will help assist with arranging Lucianne Lei transportation for appointments when needed. We also discussed options for transportation on short notice/acute visits.   4. Co-morbidities Complicating Care:   5. Palliative Care- based on stage of cancer and/or identified needs today, I will  refer patient to palliative care for goals of care and advanced care planning.  We also discussed the role of the Symptom Management Clinic at [facility] for acute issues and methods of contacting clinic/provider. She denies needing specific assistance at this time and She will be followed by ***, RN (Nurse Navigator).    Visit Diagnosis No diagnosis found.   Patient expressed understanding and was in agreement with this plan. She also understands that She can call clinic at any time with any questions, concerns, or complaints.   A total of (25) minutes of face-to-face time was spent with this patient with greater than 50% of that time in  counseling and care-coordination.  Beckey Rutter, DNP, AGNP-C [facility] (work cell) [phone] (office)  CC:

## 2018-12-28 NOTE — Telephone Encounter (Signed)
Noted  

## 2018-12-28 NOTE — Progress Notes (Deleted)
Patient: Elizabeth [name] Female    DOB: March 05, 1946   73 y.o.   MRN: [id number] Visit Date: 12/28/2018  Today's Provider: Mar Daring, PA-C   No chief complaint on file.  Subjective:     HPI   Follow up Hospitalization  Patient was admitted to [facility] on 12/17/18 and discharged on 12/26/18 She was treated for Hypoxia, Treatment for this included IV antibiotics for the pneumonia with Rocephin, Zithromax, also given IV heparin initially for the PE and then switched to oral Eliquis. Telephone follow up was done on 12/27/2018 She reports {DESC; EXCELLENT/GOOD/FAIR:19992} compliance with treatment. She reports this condition is {improved/worse/unchanged:3041574}.  ------------------------------------------------------------------------------------          Allergies  Allergen Reactions  . Furadantin [Nitrofurantoin]     URI  . Phenobarbital Rash     Current Outpatient Medications:  .  albuterol (VENTOLIN HFA) 108 (90 Base) MCG/ACT inhaler, Inhale 2 puffs into the lungs every 6 (six) hours as needed for wheezing or shortness of breath., Disp: 18 g, Rfl: 0 .  allopurinol (ZYLOPRIM) 300 MG tablet, Take 1 tablet (300 mg total) by mouth daily., Disp: 30 tablet, Rfl: 0 .  apixaban (ELIQUIS) 5 MG TABS tablet, Take 10 mg PO BID X 6 days and then start 5 mg PO BID thereafter. (Patient taking differently: Take 5 mg by mouth 2 (two) times daily. ), Disp: 60 tablet, Rfl: 0 .  benzonatate (TESSALON) 200 MG capsule, Take 1 capsule (200 mg total) by mouth 3 (three) times daily as needed., Disp: 30 capsule, Rfl: 0 .  cetirizine (ZYRTEC) 10 MG tablet, TAKE 1 TABLET BY MOUTH ONCE A DAY, Disp: 90 tablet, Rfl: 1 .  cholecalciferol (VITAMIN D) 1000 units tablet, Take 1,000 Units by mouth daily., Disp: , Rfl:  .  CINNAMON PO, Take by mouth daily. , Disp: , Rfl:  .  dexamethasone (DECADRON) 4 MG tablet, Take 2 tablets by mouth once a day on the day after chemotherapy and then take 2  tablets two times a day for 2 days. Take with food., Disp: 30 tablet, Rfl: 1 .  fentaNYL (DURAGESIC) 12 MCG/HR, Place 1 patch onto the skin every 3 (three) days., Disp: 10 patch, Rfl: 0 .  fluticasone (FLONASE) 50 MCG/ACT nasal spray, PLACE 2 SPRAYS INTO BOTH NOSTRILS DAILY, Disp: 48 g, Rfl: 1 .  Fluticasone-Salmeterol (ADVAIR DISKUS) 250-50 MCG/DOSE AEPB, INHALE 1 PUFF INTO LUNGS EVERY 12 HOURS (TWICE DAILY) RINSE MOUTH AFTER USE. (Patient taking differently: as needed. INHALE 1 PUFF INTO LUNGS EVERY 12 HOURS (TWICE DAILY) RINSE MOUTH AFTER USE), Disp: 60 each, Rfl: 5 .  folic acid (FOLVITE) 1 MG tablet, Take 1 mg by mouth daily. , Disp: , Rfl:  .  gabapentin (NEURONTIN) 300 MG capsule, Take 1 capsule (300 mg total) by mouth 3 (three) times daily., Disp: 90 capsule, Rfl: 0 .  Ginger, Zingiber officinalis, (GINGER PO), Take 1 capsule by mouth daily. , Disp: , Rfl:  .  HYDROcodone-acetaminophen (NORCO) 5-325 MG tablet, Take 1 tablet by mouth every 6 (six) hours as needed for moderate pain., Disp: 30 tablet, Rfl: 0 .  levofloxacin (LEVAQUIN) 500 MG tablet, Take 1 tablet (500 mg total) by mouth daily for 5 days., Disp: 5 tablet, Rfl: 0 .  lidocaine-prilocaine (EMLA) cream, Apply to affected area once, Disp: 30 g, Rfl: 3 .  methotrexate 2.5 MG tablet, Take 7.5 mg by mouth once a week. , Disp: , Rfl:  .  metoprolol  succinate (TOPROL-XL) 50 MG 24 hr tablet, TAKE 1 TABLET BY MOUTH TWICE A DAY WITH OR IMMEDIATELY FOLLOWING A MEAL (Patient taking differently: Take 50 mg by mouth 2 (two) times daily. ), Disp: 180 tablet, Rfl: 3 .  Misc Natural Products (HERBAL ENERGY COMPLEX PO), Take 1 capsule by mouth daily. , Disp: , Rfl:  .  montelukast (SINGULAIR) 10 MG tablet, TAKE 1 TABLET BY MOUTH ONCE DAILY, Disp: 90 tablet, Rfl: 3 .  Multiple Vitamin tablet, Take 1 tablet by mouth daily. , Disp: , Rfl:  .  Multiple Vitamins-Minerals (PRESERVISION AREDS PO), Take by mouth daily. , Disp: , Rfl:  .  Multiple  Vitamins-Minerals (ZINC PO), Take by mouth daily. , Disp: , Rfl:  .  ondansetron (ZOFRAN) 8 MG tablet, Take 1 tablet (8 mg total) by mouth 2 (two) times daily as needed. Start on the third day after chemotherapy., Disp: 30 tablet, Rfl: 1 .  polyethylene glycol (MIRALAX / GLYCOLAX) 17 g packet, Take 17 g by mouth daily as needed for mild constipation., Disp: 14 each, Rfl: 0 .  Probiotic Product (PROBIOTIC-10 PO), Take by mouth daily., Disp: , Rfl:  .  prochlorperazine (COMPAZINE) 10 MG tablet, Take 1 tablet (10 mg total) by mouth every 6 (six) hours as needed (Nausea or vomiting)., Disp: 30 tablet, Rfl: 1 .  tiZANidine (ZANAFLEX) 4 MG tablet, TAKE 1 TABLET BY MOUTH 3 TIMES DAILY AS NEEDED FOR MUSCLE SPASMS (Patient taking differently: Take 4 mg by mouth every 8 (eight) hours as needed for muscle spasms. ), Disp: 90 tablet, Rfl: 3 .  TURMERIC PO, Take by mouth daily. , Disp: , Rfl:  .  vitamin E 1000 UNIT capsule, Take 1,000 Units by mouth daily., Disp: , Rfl:   Review of Systems  Social History   Tobacco Use  . Smoking status: Former Smoker    Packs/day: 0.50    Years: 35.00    Pack years: 17.50    Quit date: 05/20/2003    Years since quitting: 15.6  . Smokeless tobacco: Never Used  Substance Use Topics  . Alcohol use: Yes    Alcohol/week: 14.0 standard drinks    Types: 14 Glasses of wine per week    Comment: occasional; 12/16/18 - states no alcohol since 12/02/18       Objective:   There were no vitals taken for this visit. There were no vitals filed for this visit.   Physical Exam   No results found for any visits on 12/29/18.     Assessment & [place], PA-C  [facility]

## 2018-12-28 NOTE — Telephone Encounter (Signed)
Patient has hospital f/u scheduled tomorrow. Will address and give Rx tomorrow

## 2018-12-28 NOTE — Telephone Encounter (Signed)
  Jenene Slicker, RN  Registered Nurse  Nursing  Progress Notes    Incomplete  Date of Service:  12/28/2018 10:30 AM      Related encounter: NEW START from 12/28/2018 in [place]      Incomplete         Patient's daughter, Chrys Racer, called regarding the patient's home equipment needs. Patient's daughter reported that patient is extremely short of breath when ambulating to the bathroom and that it took 1.5 hours (per patient daughter) to get patient into the car for her appointment. This RN informed the patient that there was no PT recommendation for assistive devices prior to discharge. This RN informed the daughter that the primary care provider would be the person to request a PT consult at this time. This RN informed the daughter that the home oxygen service provider is [employer]. This nurse also informed the daughter to call EMS and have patient transported to the nearest emergency department. Patient's daughter stated that she wanted to see how patient felt when she awakened and that she would reach out to the patient's primary care provider first.

## 2018-12-28 NOTE — Telephone Encounter (Signed)
FYI:  Pt's daughter Katha Cabal ([phone]) called to get the lost bottle of Xanax called in.   I let them know to reach out to [facility] because they had prescribed the medication when pt was in the [facility] hospital.  Daugther Chrys Racer will try to call [facility].  Thanks, [employer]

## 2018-12-28 NOTE — Telephone Encounter (Signed)
Please advise 

## 2018-12-28 NOTE — Telephone Encounter (Signed)
°  Jacelyn Pi, RN at [facility] called to let Elizabeth [name] know she will discuss the pt was not discharged with the Xanax Rx.  Pt was given Xanax as needed while in the hosptial at [facility].  Thanks, [employer]

## 2018-12-28 NOTE — Telephone Encounter (Signed)
Pt's husband Eulas Post [phone] needing to discuss getting a wheelchair for pt.  Please call Eulas Post back.  Thanks, [employer]

## 2018-12-29 ENCOUNTER — Other Ambulatory Visit: Payer: Self-pay

## 2018-12-29 ENCOUNTER — Encounter: Payer: Self-pay | Admitting: Physician Assistant

## 2018-12-29 ENCOUNTER — Inpatient Hospital Stay: Payer: Medicare Other | Admitting: Physician Assistant

## 2018-12-29 ENCOUNTER — Other Ambulatory Visit: Payer: Medicare Other

## 2018-12-29 ENCOUNTER — Ambulatory Visit
Admission: RE | Admit: 2018-12-29 | Discharge: 2018-12-29 | Disposition: A | Discharge status: Home / Self Care | Payer: Medicare Other | Source: Ambulatory Visit | Attending: Radiation Oncology | Admitting: Radiation Oncology

## 2018-12-29 ENCOUNTER — Ambulatory Visit
Admission: RE | Admit: 2018-12-29 | Discharge: 2018-12-29 | Disposition: A | Discharge status: Home / Self Care | Payer: Medicare Other | Attending: Vascular Surgery | Admitting: Vascular Surgery

## 2018-12-29 ENCOUNTER — Encounter
Admission: RE | Disposition: A | Discharge status: Home / Self Care | Payer: Self-pay | Source: Home / Self Care | Attending: Vascular Surgery

## 2018-12-29 DIAGNOSIS — Z510 Encounter for antineoplastic radiation therapy: Secondary | ICD-10-CM | POA: Diagnosis not present

## 2018-12-29 DIAGNOSIS — C3490 Malignant neoplasm of unspecified part of unspecified bronchus or lung: Secondary | ICD-10-CM | POA: Diagnosis not present

## 2018-12-29 DIAGNOSIS — Z538 Procedure and treatment not carried out for other reasons: Secondary | ICD-10-CM | POA: Insufficient documentation

## 2018-12-29 DIAGNOSIS — F418 Other specified anxiety disorders: Secondary | ICD-10-CM

## 2018-12-29 SURGERY — PORTA CATH INSERTION
Anesthesia: Moderate Sedation

## 2018-12-29 MED ORDER — SODIUM CHLORIDE 0.9 % IV SOLN: INTRAVENOUS | Status: DC

## 2018-12-29 MED ORDER — DIPHENHYDRAMINE HCL 50 MG/ML IJ SOLN: 50.0000 mg | Freq: Once | INTRAMUSCULAR | Status: DC | PRN

## 2018-12-29 MED ORDER — SODIUM CHLORIDE 0.9 % IV BOLUS
1000.0000 mL | Freq: Once | INTRAVENOUS | Status: AC
  Administered 2018-12-29: 1000 mL via INTRAVENOUS

## 2018-12-29 MED ORDER — FAMOTIDINE 20 MG PO TABS: 40.0000 mg | ORAL_TABLET | Freq: Once | ORAL | Status: DC | PRN

## 2018-12-29 MED ORDER — ALPRAZOLAM 0.25 MG PO TABS: 0.2500 mg | ORAL_TABLET | Freq: Two times a day (BID) | ORAL | 0 refills | Status: AC | PRN

## 2018-12-29 MED ORDER — METHYLPREDNISOLONE SODIUM SUCC 125 MG IJ SOLR: 125.0000 mg | Freq: Once | INTRAMUSCULAR | Status: DC | PRN

## 2018-12-29 MED ORDER — CEFAZOLIN SODIUM-DEXTROSE 2-4 GM/100ML-% IV SOLN: 2.0000 g | Freq: Once | INTRAVENOUS | Status: DC

## 2018-12-29 MED ORDER — CEFAZOLIN SODIUM-DEXTROSE 2-4 GM/100ML-% IV SOLN
INTRAVENOUS | Status: AC
  Filled 2018-12-29: qty 100

## 2018-12-29 MED ORDER — SODIUM CHLORIDE 0.9 % IV BOLUS: 250.0000 mL | Freq: Once | INTRAVENOUS | Status: DC

## 2018-12-29 MED ORDER — MIDAZOLAM HCL 2 MG/ML PO SYRP: 8.0000 mg | ORAL_SOLUTION | Freq: Once | ORAL | Status: DC | PRN

## 2018-12-29 MED ORDER — HYDROMORPHONE HCL 1 MG/ML IJ SOLN: 1.0000 mg | Freq: Once | INTRAMUSCULAR | Status: DC | PRN

## 2018-12-29 MED ORDER — SODIUM CHLORIDE 0.9 % IV BOLUS
250.0000 mL | Freq: Once | INTRAVENOUS | Status: AC
  Administered 2018-12-29: 1000 mL via INTRAVENOUS

## 2018-12-29 MED ORDER — ONDANSETRON HCL 4 MG/2ML IJ SOLN: 4.0000 mg | Freq: Four times a day (QID) | INTRAMUSCULAR | Status: DC | PRN

## 2018-12-29 NOTE — Progress Notes (Signed)
Called and spoke with Dr. Verdell Carmine re: Orthostatic BP's within NL range. DC orders obtained and pt. To return on Friday 8/14 for port placement. Pt. And spouse agreeable.

## 2018-12-29 NOTE — Progress Notes (Signed)
Call to Hospitalist to come see pt. . Charge RN Philemon Kingdom, RN spoke with hospitalist Dr. Patsi Sears.

## 2018-12-29 NOTE — Progress Notes (Signed)
Dr. Mindi Slicker here at bedside to evaluate pt. Pt. Husband at bedside now also . Fluids WIDE OPEN now per MD orders.

## 2018-12-29 NOTE — Progress Notes (Signed)
Pt. Arrived as outpt.: bilat. BP's low : Left 65/syst., Right 75/systolic. Pt. Asymptomatic, stating "I took my metoprolol this morning." Also states she fell out of wheelchair this AM and hit back right posterior part of head.My husband fell on top of me after we went down 2 steps in the wheel chair and he let go."  AA&Ox3. Speech clear. PERL. No neuro deficits at present. Call to Dr. Delana Meyer- orders received . IV started and NS bolus began.

## 2018-12-29 NOTE — Progress Notes (Signed)
Patient: Elizabeth [name] Female    DOB: [birth year]   73 y.o.   MRN: [id number] Visit Date: 12/30/2018  Today's Provider: Mar Daring, PA-C   Chief Complaint  Patient presents with  . Hospitalization Follow-up   Subjective:    I,Joseline E. Rosas,RMA am acting as a Education administrator for [employer], PA-C.  HPI    Follow up Hospitalization  Patient was admitted to [facility] on 12/17/2018 and discharged on 12/26/2018.  She was treated for: Lung mass, Hypoxia, Metastatic malignant neoplasm, unspecified site, hyperuricemia and hypercalcemia. Treatment for this included: see notes in chart. Telephone follow up was done on 12/27/2018 She reports good compliance with treatment. She reports this condition is Improved. Still having some pain with movement, specifically walking, but pain patch is helping some.  Reports she is ready to fight this and will do what it takes. She states she is feeling about 50-70% better than before she went into hospital. She is wanting to get [facility] started as well and start therapy.   ------------------------------------------------------------------------------------  Allergies  Allergen Reactions  . Furadantin [Nitrofurantoin]     URI  . Phenobarbital Rash    No current facility-administered medications for this visit.   Current Outpatient Medications:  .  ALPRAZolam (XANAX) 0.25 MG tablet, Take 1 tablet (0.25 mg total) by mouth 2 (two) times daily as needed for anxiety., Disp: 60 tablet, Rfl: 0  Facility-Administered Medications Ordered in Other Visits:  .  0.9 %  sodium chloride infusion, , Intravenous, Continuous, Eulogio Ditch E, NP .  ceFAZolin (ANCEF) IVPB 2g/100 mL premix, 2 g, Intravenous, Once, Eulogio Ditch E, NP .  diphenhydrAMINE (BENADRYL) injection 50 mg, 50 mg, Intravenous, Once PRN, Kris Hartmann, NP .  famotidine (PEPCID) tablet 40 mg, 40 mg, Oral, Once PRN, Kris Hartmann, NP .  HYDROmorphone (DILAUDID) injection 1 mg, 1 mg,  Intravenous, Once PRN, Kris Hartmann, NP .  methylPREDNISolone sodium succinate (SOLU-MEDROL) 125 mg/2 mL injection 125 mg, 125 mg, Intravenous, Once PRN, Eulogio Ditch E, NP .  midazolam (VERSED) 2 MG/ML syrup 8 mg, 8 mg, Oral, Once PRN, Kris Hartmann, NP .  ondansetron [facility]) injection 4 mg, 4 mg, Intravenous, Q6H PRN, Eulogio Ditch E, NP .  sodium chloride 0.9 % bolus 250 mL, 250 mL, Intravenous, Once, Schnier, Dolores Lory, MD  Review of Systems  Constitutional: Positive for fatigue. Negative for appetite change, chills and fever.  HENT: Negative.   Respiratory: Positive for shortness of breath. Negative for cough, chest tightness and wheezing.   Cardiovascular: Positive for leg swelling. Negative for chest pain and palpitations.  Gastrointestinal: Positive for nausea ("sometimes"). Negative for abdominal pain and vomiting.  Musculoskeletal: Positive for arthralgias, back pain, gait problem and myalgias.  Neurological: Positive for weakness. Negative for dizziness and numbness.    Social History   Tobacco Use  . Smoking status: Former Smoker    Packs/day: 0.50    Years: 35.00    Pack years: 17.50    Quit date: 05/20/2003    Years since quitting: 15.6  . Smokeless tobacco: Never Used  Substance Use Topics  . Alcohol use: Yes    Alcohol/week: 14.0 standard drinks    Types: 14 Glasses of wine per week    Comment: occasional; 12/16/18 - states no alcohol since 12/02/18       Objective:   BP (!) 90/57 (BP Location: Left Arm, Patient Position: Sitting, Cuff Size: Normal)   Pulse Marland Kitchen)  108   Temp 98.9 F (37.2 C) (Oral)   Resp 16   SpO2 91% Comment: She is on 2 liters Vitals:   12/30/18 1121  BP: (!) 90/57  Pulse: (!) 108  Resp: 16  Temp: 98.9 F (37.2 C)  TempSrc: Oral  SpO2: 91%     Physical Exam Vitals signs reviewed.  Constitutional:      General: She is not in acute distress.    Appearance: Normal appearance. She is well-developed. She is not ill-appearing or  diaphoretic.  Neck:     Musculoskeletal: Normal range of motion and neck supple.  Cardiovascular:     Rate and Rhythm: Normal rate and regular rhythm.     Pulses: Normal pulses.     Heart sounds: Normal heart sounds. No murmur. No friction rub. No gallop.   Pulmonary:     Effort: Pulmonary effort is normal. No respiratory distress.     Breath sounds: Normal breath sounds. No wheezing or rales.  Neurological:     Mental Status: She is alert.     Gait: Gait abnormal (in wheelchair).     No results found for any visits on 12/30/18.     Assessment & Plan    1. Transition of care performed with sharing of clinical summary Hospital notes, consults, procedures, images and labs all reviewed prior to appointment today.   2. Hyperuricemia Stable. Stable. Diagnosis pulled for medication refill. Continue current medical treatment plan. Will f/u with Oncology for labs.  - allopurinol (ZYLOPRIM) 300 MG tablet; Take 1 tablet (300 mg total) by mouth daily.  Dispense: 90 tablet; Refill: 1  3. Vitamin D deficiency Stable. Diagnosis pulled for medication refill. Continue current medical treatment plan. - Vitamin D, Ergocalciferol, (DRISDOL) 1.25 MG (50000 UT) CAPS capsule; Take 1 capsule (50,000 Units total) by mouth every 7 (seven) days.  Dispense: 12 capsule; Refill: 1  4. Rash Stable. Diagnosis pulled for medication refill. Continue current medical treatment plan. - triamcinolone cream (KENALOG) 0.1 %; Apply 1 application topically 2 (two) times daily.  Dispense: 30 g; Refill: 0  5. Leg swelling Patient reports improved from hospitalization. Advised to wear compression stockings and elevate legs when at rest.   6. 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 Biopsy done during hospitalization.   7. Acute respiratory failure, unspecified whether with hypoxia or hypercapnia ([place]) Noted for cause of admission. Patient also on O2 2L today in office. O2 sat was low and advised  that she had been discharged on 3-4L so that was increased before she left the office.   8. Other acute pulmonary embolism without acute cor pulmonale ([place]) Started on Eliquis. Reports medication compliance.   9. Metastatic malignant neoplasm, unspecified site ([place]) Mets to sacrum and ilium.      Mar Daring, PA-C  [facility]

## 2018-12-29 NOTE — Consult Note (Signed)
[place] [facility] [place] NAME: Elizabeth [name]    MR#:  [id number]  DATE OF BIRTH:  02-07-1946  DATE OF CONSULT:  12/29/2018  PRIMARY CARE PHYSICIAN: Mar Daring, PA-C   REQUESTING/REFERRING PHYSICIAN: Dr. Ella Jubilee  CHIEF COMPLAINT:  No chief complaint on file. Hypotension  HISTORY OF PRESENT ILLNESS:  Elizabeth [name]  is a 73 y.o. female with a known history of recently diagnosed metastatic lung cancer, hypertension, hyperlipidemia who presented to hospitals procedures to get a Port-A-Cath placed for initiating chemotherapy but preoperatively was noted to be hypotensive with systolic blood pressures in the 60s to 70s.  Patient is on antihypertensive meds and took her metoprolol this morning and also took her hydrochlorothiazide when she was advised not to resume them upon discharge recently.  Patient was complaining of some slight dizziness.  Hospitalist services were contacted for admission.  Patient denied any symptoms of chest pains, nausea, vomiting, fever chills cough, diarrhea or any other associated symptoms.  Patient was given IV fluid hydration and patient's hemodynamics significantly improved.  She was clinically asymptomatic and therefore discharged home with outpatient rescheduling of her Port-A-Cath placement and follow-up with heme-onc to initiate her radiation/chemotherapy.  PAST MEDICAL HISTORY:   Past Medical History:  Diagnosis Date  . Allergy   . Cancer ([place]) 09/2014   Skin Cancer  . Hyperlipidemia   . Hypertension     PAST SURGICAL HISTOIRY:   Past Surgical History:  Procedure Laterality Date  . ABDOMINAL HYSTERECTOMY  1989   due to menorrhagia  . COLONOSCOPY    . DILATION AND CURETTAGE OF UTERUS  1977  . SKIN CANCER EXCISION  10/2014   on chest; Dr. Evorn Gong  . TRIGGER FINGER RELEASE Right 2000  . WRIST ARTHROPLASTY Left 1991    SOCIAL HISTORY:   Social History   Tobacco Use  . Smoking status: Former Smoker     Packs/day: 0.50    Years: 35.00    Pack years: 17.50    Quit date: 05/20/2003    Years since quitting: 15.6  . Smokeless tobacco: Never Used  Substance Use Topics  . Alcohol use: Yes    Alcohol/week: 14.0 standard drinks    Types: 14 Glasses of wine per week    Comment: occasional; 12/16/18 - states no alcohol since 12/02/18     FAMILY HISTORY:   Family History  Problem Relation Age of Onset  . Arthritis Mother   . Cerebrovascular Disease Mother   . Heart disease Mother   . Vascular Disease Mother   . Hypertension Father   . COPD Father   . Throat cancer Father   . Melanoma Father   . Prostate cancer Father   . Skin cancer Brother   . Skin cancer Brother   . Breast cancer Paternal Aunt 25    DRUG ALLERGIES:   Allergies  Allergen Reactions  . Furadantin [Nitrofurantoin]     URI  . Phenobarbital Rash    REVIEW OF SYSTEMS:   Review of Systems  Constitutional: Negative for fever and weight loss.  HENT: Negative for congestion, nosebleeds and tinnitus.   Eyes: Negative for blurred vision, double vision and redness.  Respiratory: Negative for cough, hemoptysis and shortness of breath.   Cardiovascular: Negative for chest pain, orthopnea, leg swelling and PND.  Gastrointestinal: Negative for abdominal pain, diarrhea, melena, nausea and vomiting.  Genitourinary: Negative for dysuria, hematuria and urgency.  Musculoskeletal: Negative for falls and joint pain.  Neurological:  Negative for dizziness, tingling, sensory change, focal weakness, seizures, weakness and headaches.  Endo/Heme/Allergies: Negative for polydipsia. Does not bruise/bleed easily.  Psychiatric/Behavioral: Negative for depression and memory loss. The patient is not nervous/anxious.      MEDICATIONS AT HOME:   Prior to Admission medications   Medication Sig Start Date End Date Taking? Authorizing Provider  albuterol (VENTOLIN HFA) 108 (90 Base) MCG/ACT inhaler Inhale 2 puffs into the lungs every 6 (six)  hours as needed for wheezing or shortness of breath. 12/09/18   Burnette, Clearnce Sorrel, PA-C  allopurinol (ZYLOPRIM) 300 MG tablet Take 1 tablet (300 mg total) by mouth daily. 12/26/18 01/25/19  Henreitta Leber, MD  ALPRAZolam Duanne Moron) 0.25 MG tablet Take 1 tablet (0.25 mg total) by mouth 2 (two) times daily as needed for anxiety. 12/29/18   Mar Daring, PA-C  apixaban (ELIQUIS) 5 MG TABS tablet Take 10 mg PO BID X 6 days and then start 5 mg PO BID thereafter. Patient taking differently: Take 5 mg by mouth 2 (two) times daily.  12/26/18   Henreitta Leber, MD  benzonatate (TESSALON) 200 MG capsule Take 1 capsule (200 mg total) by mouth 3 (three) times daily as needed. 12/09/18   Mar Daring, PA-C  cetirizine (ZYRTEC) 10 MG tablet TAKE 1 TABLET BY MOUTH ONCE A DAY 12/04/17   Mar Daring, PA-C  cholecalciferol (VITAMIN D) 1000 units tablet Take 1,000 Units by mouth daily.    [provider]  CINNAMON PO Take by mouth daily.     [provider]  dexamethasone (DECADRON) 4 MG tablet Take 2 tablets by mouth once a day on the day after chemotherapy and then take 2 tablets two times a day for 2 days. Take with food. 12/27/18   Earlie Server, MD  fentaNYL (DURAGESIC) 12 MCG/HR Place 1 patch onto the skin every 3 (three) days. 12/28/18 01/27/19  Henreitta Leber, MD  fluticasone (FLONASE) 50 MCG/ACT nasal spray PLACE 2 SPRAYS INTO BOTH NOSTRILS DAILY 12/27/18   Fenton Malling M, PA-C  Fluticasone-Salmeterol (ADVAIR DISKUS) 250-50 MCG/DOSE AEPB INHALE 1 PUFF INTO LUNGS EVERY 12 HOURS (TWICE DAILY) RINSE MOUTH AFTER USE. Patient taking differently: as needed. INHALE 1 PUFF INTO LUNGS EVERY 12 HOURS (TWICE DAILY) RINSE MOUTH AFTER USE 06/11/18   Mar Daring, PA-C  folic acid (FOLVITE) 1 MG tablet Take 1 mg by mouth daily.     [provider]  gabapentin (NEURONTIN) 300 MG capsule Take 1 capsule (300 mg total) by mouth 3 (three) times daily. 12/16/18   Earlie Server, MD   Ginger, Zingiber officinalis, (GINGER PO) Take 1 capsule by mouth daily.     [provider]  HYDROcodone-acetaminophen (NORCO) 5-325 MG tablet Take 1 tablet by mouth every 6 (six) hours as needed for moderate pain. 12/26/18   Henreitta Leber, MD  levofloxacin (LEVAQUIN) 500 MG tablet Take 1 tablet (500 mg total) by mouth daily for 5 days. 12/26/18 01/10/19  Henreitta Leber, MD  lidocaine-prilocaine (EMLA) cream Apply to affected area once 12/27/18   Earlie Server, MD  methotrexate 2.5 MG tablet Take 7.5 mg by mouth once a week.     [provider]  metoprolol succinate (TOPROL-XL) 50 MG 24 hr tablet TAKE 1 TABLET BY MOUTH TWICE A DAY WITH OR IMMEDIATELY FOLLOWING A MEAL Patient taking differently: Take 50 mg by mouth 2 (two) times daily.  08/23/18   Mar Daring, PA-C  Misc Natural Products ([place]  PO) Take 1 capsule by mouth daily.     [provider]  montelukast (SINGULAIR) 10 MG tablet TAKE 1 TABLET BY MOUTH ONCE DAILY 09/24/18   Mar Daring, PA-C  Multiple Vitamin tablet Take 1 tablet by mouth daily.  02/27/11   [provider]  Multiple Vitamins-Minerals (PRESERVISION AREDS PO) Take by mouth daily.     [provider]  Multiple Vitamins-Minerals (ZINC PO) Take by mouth daily.     [provider]  ondansetron (ZOFRAN) 8 MG tablet Take 1 tablet (8 mg total) by mouth 2 (two) times daily as needed. Start on the third day after chemotherapy. 12/27/18   Earlie Server, MD  polyethylene glycol (MIRALAX / GLYCOLAX) 17 g packet Take 17 g by mouth daily as needed for mild constipation. 12/26/18   Henreitta Leber, MD  Probiotic Product (PROBIOTIC-10 PO) Take by mouth daily.    [provider]  prochlorperazine (COMPAZINE) 10 MG tablet Take 1 tablet (10 mg total) by mouth every 6 (six) hours as needed (Nausea or vomiting). Patient not taking: Reported on 12/29/2018 12/27/18   Earlie Server, MD  tiZANidine (ZANAFLEX) 4 MG tablet TAKE 1  TABLET BY MOUTH 3 TIMES DAILY AS NEEDED FOR MUSCLE SPASMS Patient taking differently: Take 4 mg by mouth every 8 (eight) hours as needed for muscle spasms.  11/11/18   Mar Daring, PA-C  TURMERIC PO Take by mouth daily.     [provider]  vitamin E 1000 UNIT capsule Take 1,000 Units by mouth daily.    [provider]      VITAL SIGNS:  There were no vitals taken for this visit.  PHYSICAL EXAMINATION:  GENERAL:  73 y.o.-year-old patient lying in the bed with no acute distress.  EYES: Pupils equal, round, reactive to light and accommodation. No scleral icterus. Extraocular muscles intact.  HEENT: Head atraumatic, normocephalic. Oropharynx and nasopharynx clear.  NECK:  Supple, no jugular venous distention. No thyroid enlargement, no tenderness.  LUNGS: Normal breath sounds bilaterally, no wheezing, rales, rhonchi . No use of accessory muscles of respiration.  CARDIOVASCULAR: S1, S2, RRR. No murmurs, rubs, gallops, clicks.  ABDOMEN: Soft, nontender, nondistended. Bowel sounds present. No organomegaly or mass.  EXTREMITIES: No pedal edema, cyanosis, or clubbing.  NEUROLOGIC: Cranial nerves II through XII are intact. No focal motor or sensory deficits appreciated bilaterally  PSYCHIATRIC: The patient is alert and oriented x 3.  SKIN: No obvious rash, lesion, or ulcer.   LABORATORY PANEL:   CBC Recent Labs  Lab 12/25/18 0445  WBC 14.3*  HGB 12.8  HCT 40.3  PLT 250   ------------------------------------------------------------------------------------------------------------------  Chemistries  No results for input(s): NA, K, CL, CO2, GLUCOSE, BUN, CREATININE, CALCIUM, MG, AST, ALT, ALKPHOS, BILITOT in the last 168 hours.  Invalid input(s): GFRCGP ------------------------------------------------------------------------------------------------------------------  Cardiac Enzymes No results for input(s): TROPONINI in the last 168  hours. ------------------------------------------------------------------------------------------------------------------  RADIOLOGY:  No results found.   IMPRESSION AND PLAN:   73 y.o. female with a known history of recently diagnosed metastatic lung cancer, hypertension, hyperlipidemia who presented to hospitals procedures to get a Port-A-Cath placed for initiating chemotherapy but preoperatively was noted to be hypotensive with systolic blood pressures in the 60s to 70s.  1.  Hypotension-this was secondary to some mild dehydration also patient taking her antihypertensives.  Patient was given aggressive IV fluid hydration and her hypotension has now resolved.  She is clinically asymptomatic. - She was advised to hold her anti-hypertensives for the next  few days and follow her blood pressure at home. -There is no clinical evidence of sepsis or any other acute volume loss to suggest.  2.  Recently diagnosed metastatic lung cancer- patient will continue follow-up with oncology as an outpatient to reschedule her radiation and chemotherapy. -Her Port-A-Cath has been rescheduled for this Friday.  3.  Essential hypertension-patient was advised to hold her antihypertensives given her relative hypotension as mentioned.  4.  History of pulmonary embolism-recently diagnosed. -Patient will continue her Eliquis.  5.  Chronic pain-secondary to her malignancy-patient will continue her fentanyl patch and Vicodin as needed.  Patient is stable to be discharged home with outpatient follow-up with hematology oncology and also to have her Port-A-Cath rescheduled.  Discussed this with nursing staff at specials procedures and they are in agreement.  All the records are reviewed and case discussed with Consulting provider. Management plans discussed with the patient, family and they are in agreement.  CODE STATUS: DNR  TOTAL TIME TAKING CARE OF THIS PATIENT: 40 minutes.    Henreitta Leber M.D on  12/29/2018 at 4:25 PM  Between 7am to 6pm - Pager - [phone]  After 6pm go to [email or web address] - password [email or web address] [facility]  [place] [facility]  Office  [phone]  CC: Primary care Physician: Mar Daring, PA-C

## 2018-12-30 ENCOUNTER — Inpatient Hospital Stay (HOSPITAL_BASED_OUTPATIENT_CLINIC_OR_DEPARTMENT_OTHER): Payer: Medicare Other | Admitting: Nurse Practitioner

## 2018-12-30 ENCOUNTER — Encounter: Payer: Self-pay | Admitting: Physician Assistant

## 2018-12-30 ENCOUNTER — Encounter: Payer: Self-pay | Admitting: Oncology

## 2018-12-30 ENCOUNTER — Ambulatory Visit: Payer: Medicare Other

## 2018-12-30 ENCOUNTER — Other Ambulatory Visit: Payer: Medicare Other

## 2018-12-30 ENCOUNTER — Other Ambulatory Visit: Payer: Self-pay

## 2018-12-30 ENCOUNTER — Ambulatory Visit
Admission: RE | Admit: 2018-12-30 | Discharge: 2018-12-30 | Disposition: A | Discharge status: Home / Self Care | Payer: Medicare Other | Source: Ambulatory Visit | Attending: Radiation Oncology | Admitting: Radiation Oncology

## 2018-12-30 ENCOUNTER — Ambulatory Visit (INDEPENDENT_AMBULATORY_CARE_PROVIDER_SITE_OTHER): Payer: Medicare Other | Admitting: Physician Assistant

## 2018-12-30 ENCOUNTER — Inpatient Hospital Stay: Payer: Medicare Other | Admitting: Nurse Practitioner

## 2018-12-30 VITALS — BP 90/57 | HR 108 | Temp 98.9°F | Resp 16

## 2018-12-30 DIAGNOSIS — R21 Rash and other nonspecific skin eruption: Secondary | ICD-10-CM | POA: Diagnosis not present

## 2018-12-30 DIAGNOSIS — E559 Vitamin D deficiency, unspecified: Secondary | ICD-10-CM | POA: Diagnosis not present

## 2018-12-30 DIAGNOSIS — M7989 Other specified soft tissue disorders: Secondary | ICD-10-CM

## 2018-12-30 DIAGNOSIS — Z9189 Other specified personal risk factors, not elsewhere classified: Secondary | ICD-10-CM | POA: Diagnosis not present

## 2018-12-30 DIAGNOSIS — I2699 Other pulmonary embolism without acute cor pulmonale: Secondary | ICD-10-CM

## 2018-12-30 DIAGNOSIS — IMO0001 Reserved for inherently not codable concepts without codable children: Secondary | ICD-10-CM

## 2018-12-30 DIAGNOSIS — E790 Hyperuricemia without signs of inflammatory arthritis and tophaceous disease: Secondary | ICD-10-CM | POA: Diagnosis not present

## 2018-12-30 DIAGNOSIS — C3491 Malignant neoplasm of unspecified part of right bronchus or lung: Secondary | ICD-10-CM

## 2018-12-30 DIAGNOSIS — J9600 Acute respiratory failure, unspecified whether with hypoxia or hypercapnia: Secondary | ICD-10-CM

## 2018-12-30 DIAGNOSIS — Z510 Encounter for antineoplastic radiation therapy: Secondary | ICD-10-CM | POA: Diagnosis not present

## 2018-12-30 DIAGNOSIS — C799 Secondary malignant neoplasm of unspecified site: Secondary | ICD-10-CM

## 2018-12-30 MED ORDER — ALLOPURINOL 300 MG PO TABS: 300.0000 mg | ORAL_TABLET | Freq: Every day | ORAL | 1 refills | Status: AC

## 2018-12-30 MED ORDER — VITAMIN D (ERGOCALCIFEROL) 1.25 MG (50000 UNIT) PO CAPS: 50000.0000 [IU] | ORAL_CAPSULE | ORAL | 1 refills | Status: AC

## 2018-12-30 MED ORDER — TRIAMCINOLONE ACETONIDE 0.1 % EX CREA: 1.0000 "application " | TOPICAL_CREAM | Freq: Two times a day (BID) | CUTANEOUS | 0 refills | Status: AC

## 2018-12-30 NOTE — Progress Notes (Signed)
Tumor Board Documentation  Elizabeth [name] was presented by Dr Tasia Catchings at our Tumor Board on 12/30/2018, which included representatives from medical oncology, surgical, radiology, pathology, pharmacy, internal medicine, navigation, research, palliative care.  Elizabeth [name] currently presents as a new patient, for new positive pathology, for Elizabeth [name] with history of the following treatments: active survellience, surgical intervention(s).  Additionally, we reviewed previous medical and familial history, history of present illness, and recent lab results along with all available histopathologic and imaging studies. The tumor board considered available treatment options and made the following recommendations: Chemotherapy    The following procedures/referrals were also placed: No orders of the defined types were placed in this encounter.   Clinical Trial Status: not discussed   Staging used: AJCC Stage Group  AJCC Staging: T: 4 N: 3 M: 1c Group: Stage 4 B Squamous Cell Lung Cancer   National site-specific guidelines NCCN were discussed with respect to the case.  Tumor board is a meeting of clinicians from various specialty areas who evaluate and discuss patients for whom a multidisciplinary approach is being considered. Final determinations in the plan of care are those of the provider(s). The responsibility for follow up of recommendations given during tumor board is that of the provider.   Today's extended care, comprehensive team conference, Elizabeth [name] was not present for the discussion and was not examined.   Multidisciplinary Tumor Board is a multidisciplinary case peer review process.  Decisions discussed in the Multidisciplinary Tumor Board reflect the opinions of the specialists present at the conference without having examined the patient.  Ultimately, treatment and diagnostic decisions rest with the primary provider(s) and the patient.

## 2018-12-30 NOTE — Patient Instructions (Signed)
Lung Cancer Lung cancer is an abnormal growth of cancerous cells that forms a mass (malignant tumor) in a lung. There are several types of lung cancer. The types are based on the appearance of the tumor cells. The two most common types are:  Non-small cell lung cancer. This type of lung cancer is the most common type. Non-small cell lung cancers include squamous cell carcinoma, adenocarcinoma, and large cell carcinoma.  Small cell lung cancer. In this type of lung cancer, abnormal cells are smaller than those of non-small cell lung cancer. Small cell lung cancer gets worse (progresses) faster than non-small cell lung cancer. What are the causes? The most common cause of lung cancer is smoking tobacco. The second most common cause is exposure to a chemical called radon. What increases the risk? You are more likely to develop this condition if:  You smoke tobacco.  You have been exposed to: ? Secondhand tobacco smoke. ? Radon gas. ? Uranium. ? Asbestos. ? Arsenic in drinking water. ? Air pollution.  You have a family or personal history of lung cancer.  You have had lung radiation therapy in the past.  You are older than age 74. What are the signs or symptoms? In the early stages, you may not have any symptoms. As the cancer progresses, symptoms may include:  A lasting cough, possibly with blood.  Fatigue.  Unexplained weight loss.  Shortness of breath.  Loud breathing (wheezing).  Chest pain.  Loss of appetite. Symptoms of advanced lung cancer include:  Hoarseness.  Bone or joint pain.  Weakness.  Change in the structure of the fingernails (clubbing), so that the nail looks like an upside-down spoon.  Swelling of the face or arms.  Inability to move the face (paralysis).  Drooping eyelids. How is this diagnosed? This condition may be diagnosed based on:  Your symptoms and medical history.  A physical exam.  A chest X-ray.  A CT scan.  Blood tests.   Sputum tests.  Removal of a sample of lung tissue (lung biopsy) for testing. Your cancer will be assessed (staged) to determine how severe it is and how much it has spread (metastasized). How is this treated? Treatment depends on the type and stage of your cancer. Treatment may include one or more of the following:  Surgery to remove as much of the cancer as possible. Lymph nodes in the area may be removed and tested for cancer as well.  Medicines that kill cancer cells (chemotherapy).  High-energy rays that kill cancer cells (radiation therapy).  Chemotherapy. This treatment uses medicines to destroy cancer cells.  Targeted therapy. This targets specific parts of cancer cells and the area around them to block the growth and spread of the cancer. Targeted therapy can help limit the damage to healthy cells. Follow these instructions at home: Eating and drinking  Some of your treatments might affect your appetite. If you are having problems eating, or if you do not have an appetite, meet with a dietitian.  If you have side effects that affect your appetite, it may help to: ? Eat smaller meals and snacks often. ? Drink high-nutrition and high-calorie shakes or supplements. ? Eat bland and soft foods that are easy to eat. ? Avoid eating foods that are hot, spicy, or hard to swallow. General instructions   Do not use any products that contain nicotine or tobacco, such as cigarettes and e-cigarettes. If you need help quitting, ask your health care provider.  Do not drink alcohol.  If you are admitted to the hospital, make sure your cancer specialist (oncologist) is aware. Your cancer may affect your treatment for other conditions.  Take over-the-counter and prescription medicines only as told by your health care provider.  Consider joining a support group for people who have been diagnosed with lung cancer.  Work with your health care provider to manage any side effects of treatment.   Keep all follow-up visits as told by your health care provider. This is important. Where to find more information  American Cancer Society: https://www.cancer.[place] ([place]): https://www.cancer.gov Contact a health care provider if you:  Lose weight without trying.  Have a persistent cough and wheezing.  Feel short of breath.  Get tired easily.  Have bone or joint pain.  Have difficulty swallowing.  Notice that your voice is changing or getting hoarse.  Have pain that does not get better with medicine. Get help right away if you:  Cough up blood.  Have new breathing problems.  Have chest pain.  Have a fever.  Have swelling in an ankle, leg, or arm, or the face or neck.  Have paralysis in your face.  Are very confused.  Have a drooping eyelid. Summary  Lung cancer is an abnormal growth of cancerous cells that forms a mass (malignant tumor) in a lung.  There are several types of lung cancer. The types are based on the appearance of the tumor cells. The two most common types are non-small cell and small cell.  The most common cause of lung cancer is smoking tobacco.  Early symptoms include a lasting cough, possibly with blood, and fatigue, unexplained weight loss, and shortness of breath.  After diagnosis, treatment depends on the type and stage of your cancer. This information is not intended to replace advice given to you by your health care provider. Make sure you discuss any questions you have with your health care provider. Document Released: 08/11/2000 Document Revised: 04/17/2017 Document Reviewed: 03/12/2017 Elsevier Patient Education  2020 [employer].

## 2018-12-30 NOTE — Progress Notes (Signed)
Erroneous encounter- appointment was cancelled

## 2018-12-31 ENCOUNTER — Inpatient Hospital Stay: Payer: Medicare Other | Admitting: Hospice and Palliative Medicine

## 2018-12-31 ENCOUNTER — Ambulatory Visit: Payer: Medicare Other

## 2018-12-31 ENCOUNTER — Ambulatory Visit: Admission: RE | Admit: 2018-12-31 | Payer: Medicare Other | Source: Home / Self Care | Admitting: Vascular Surgery

## 2018-12-31 ENCOUNTER — Inpatient Hospital Stay: Payer: Medicare Other

## 2018-12-31 ENCOUNTER — Inpatient Hospital Stay: Payer: Medicare Other | Admitting: Oncology

## 2018-12-31 ENCOUNTER — Ambulatory Visit: Payer: Medicare Other | Admitting: Oncology

## 2018-12-31 SURGERY — PORTA CATH INSERTION
Anesthesia: Moderate Sedation

## 2019-01-03 ENCOUNTER — Ambulatory Visit: Payer: Medicare Other

## 2019-01-04 ENCOUNTER — Ambulatory Visit: Payer: Medicare Other

## 2019-01-04 ENCOUNTER — Telehealth: Payer: Self-pay | Admitting: *Deleted

## 2019-01-04 NOTE — Telephone Encounter (Signed)
Patients daughter Katha Cabal and patients husband Tiah Heckel left messages on the triage line wishing to speak with Dr. Tasia Catchings to further discuss patients care. Per message from [place], patient passed away on 01/04/23 and the family has additional questions.

## 2019-01-05 ENCOUNTER — Ambulatory Visit: Payer: Medicare Other

## 2019-01-06 ENCOUNTER — Ambulatory Visit: Payer: Medicare Other

## 2019-01-06 ENCOUNTER — Other Ambulatory Visit: Payer: Medicare Other

## 2019-01-07 ENCOUNTER — Ambulatory Visit: Payer: Medicare Other

## 2019-01-10 ENCOUNTER — Ambulatory Visit: Payer: Medicare Other

## 2019-01-11 ENCOUNTER — Ambulatory Visit: Payer: Medicare Other

## 2019-01-12 ENCOUNTER — Ambulatory Visit: Payer: Medicare Other

## 2019-01-13 ENCOUNTER — Ambulatory Visit: Payer: Medicare Other

## 2019-01-13 ENCOUNTER — Other Ambulatory Visit: Payer: Medicare Other

## 2019-01-14 ENCOUNTER — Ambulatory Visit: Payer: Medicare Other

## 2019-01-17 ENCOUNTER — Ambulatory Visit: Payer: Medicare Other

## 2019-01-18 ENCOUNTER — Ambulatory Visit: Payer: Medicare Other

## 2019-01-18 DEATH — deceased

## 2019-01-19 ENCOUNTER — Ambulatory Visit: Payer: Medicare Other

## 2019-01-20 ENCOUNTER — Telehealth: Payer: Self-pay | Admitting: Physician Assistant

## 2019-01-20 NOTE — Telephone Encounter (Signed)
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

## 2019-01-20 NOTE — Telephone Encounter (Signed)
Please advise 

## 2019-01-20 NOTE — Telephone Encounter (Signed)
Pt's husband is calling wanting to know if we can provide diagnosis for his wives cancer policy.  He has tried calling the [facility] but can't get a call back  Pt's CB#  [phone]  [place]

## 2019-01-20 NOTE — Telephone Encounter (Signed)
LMTCB ED 

## 2019-01-21 ENCOUNTER — Telehealth: Payer: Self-pay

## 2019-01-21 ENCOUNTER — Telehealth: Payer: Self-pay | Admitting: *Deleted

## 2019-01-21 NOTE — Telephone Encounter (Signed)
Patient/husband notified.

## 2019-01-21 NOTE — Telephone Encounter (Addendum)
Will work on this afternoon and contact patient's husband.

## 2019-01-21 NOTE — Telephone Encounter (Signed)
Informed patient's husband that the request records are at front desk for pick and we are closed Monday

## 2019-01-21 NOTE — Telephone Encounter (Signed)
Patient husband called, he is trying to complete an online form for patient's cancer policy and he needs a copy of her last office note which has the diagnosis on it and a copy of her pathology report. She is deceased. Please print these off and put in an envelope at registration for him to pick up and call him when it is ready [phone]

## 2019-11-14 ENCOUNTER — Ambulatory Visit: Payer: Medicare Other

## 2020-06-20 IMAGING — US US ABDOMEN LIMITED W/ ELASTOGRAPHY
1 series · 12 of 12 positions shown · non-contrast
Comparison: None.

CLINICAL DATA: High risk medication.

EXAM:
US ABDOMEN LIMITED - RIGHT UPPER QUADRANT
ULTRASOUND HEPATIC ELASTOGRAPHY
TECHNIQUE: Limited right upper quadrant abdominal ultrasound was performed. In
addition, ultrasound elastography evaluation of the liver was
performed. A region of interest was placed in the right lobe of the
liver. Following application of a compressive sonographic pulse,
shear waves were detected in the adjacent hepatic tissue and the
shear wave velocity was calculated. Multiple assessments were
performed at the selected site. Median shear wave velocity is
correlated to a Metavir fibrosis score.

[Series 1: us abdomen limited w/ elastography · 12 of 12 slices shown]
[im 1/12]
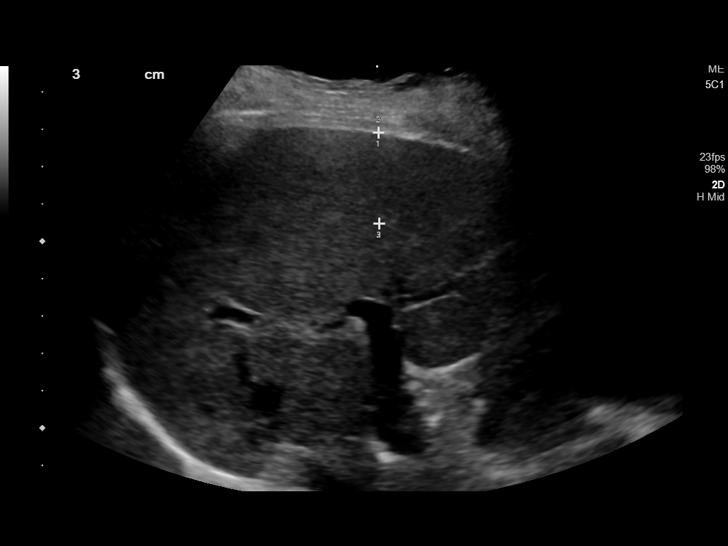
[im 2/12]
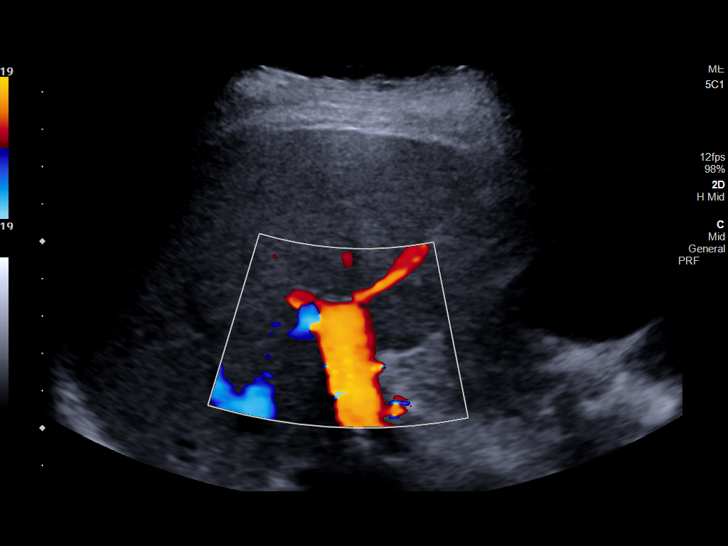
[im 3/12]
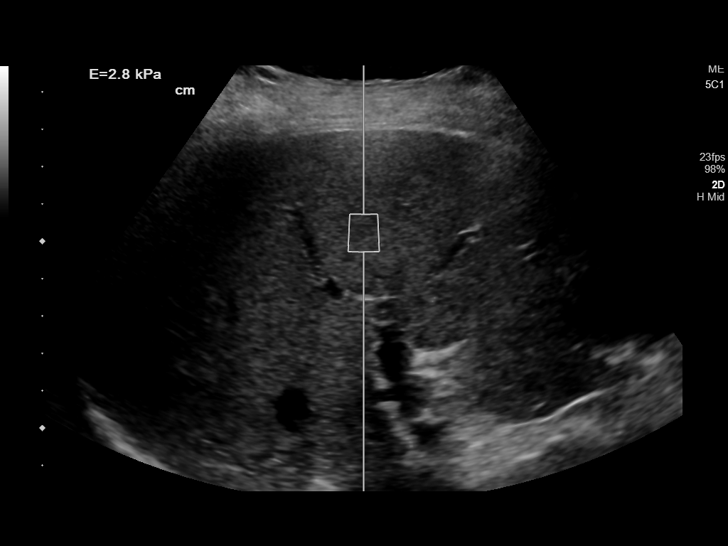
[im 4/12]
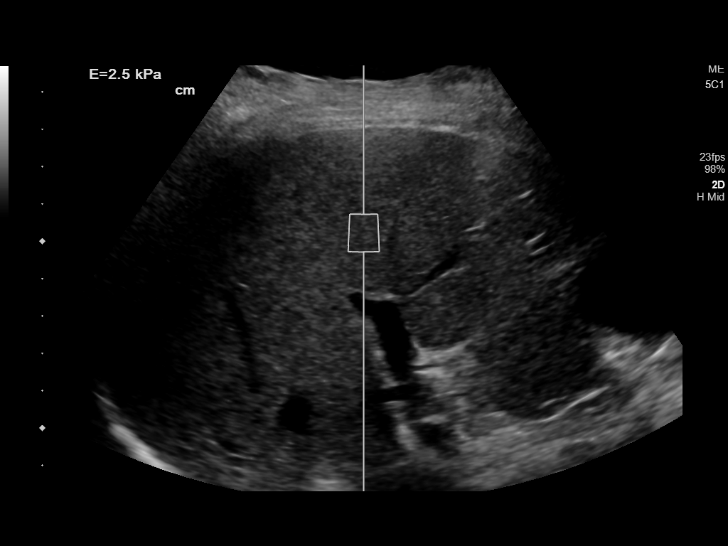
[im 5/12]
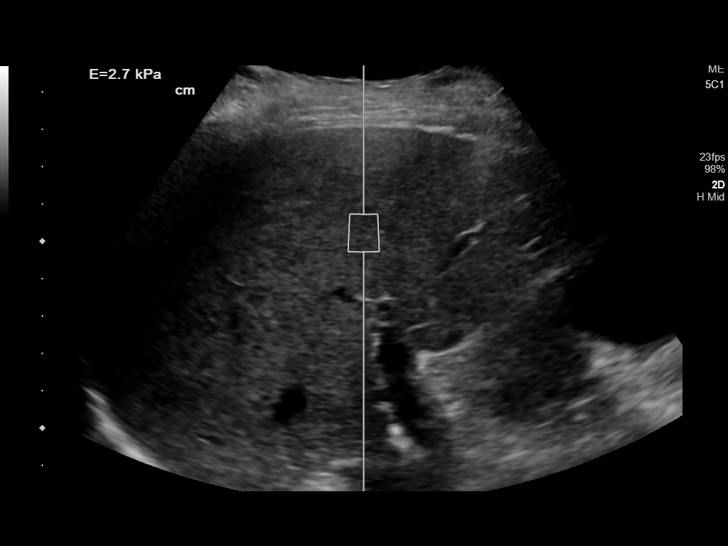
[im 6/12]
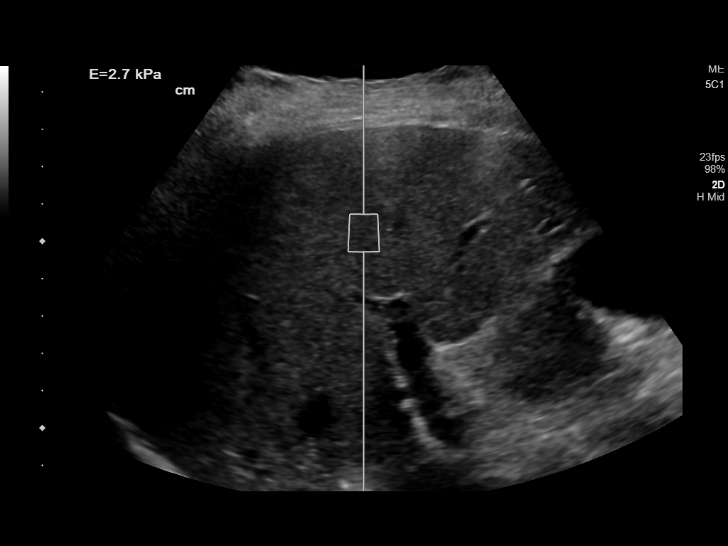
[im 7/12]
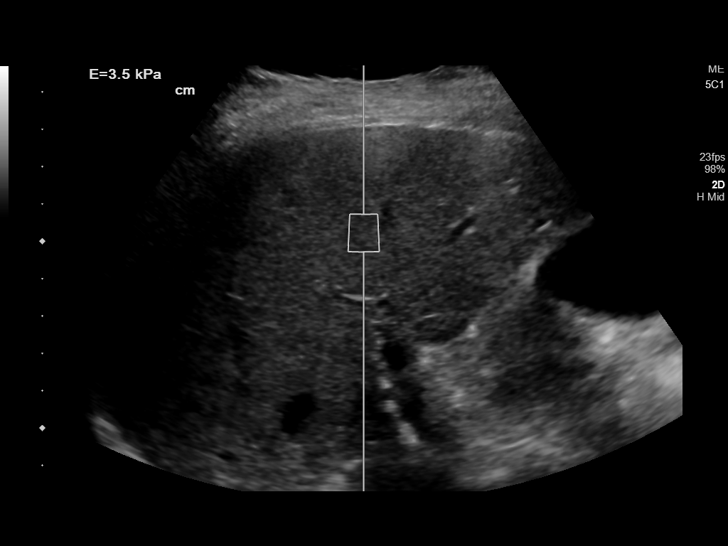
[im 8/12]
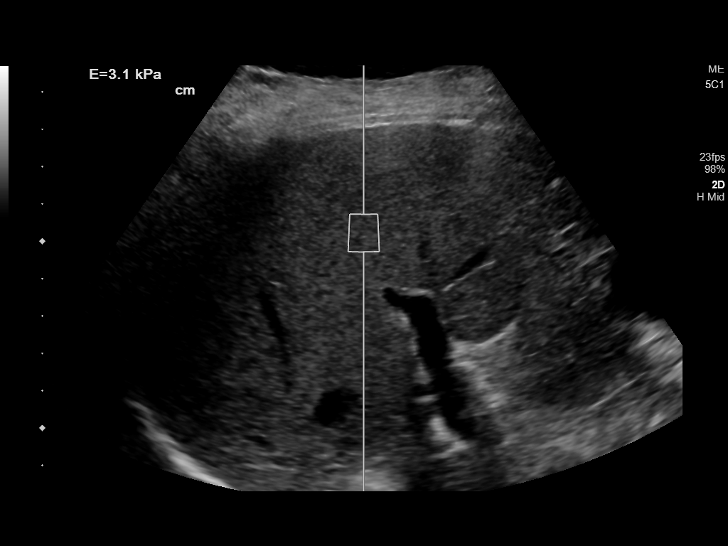
[im 9/12]
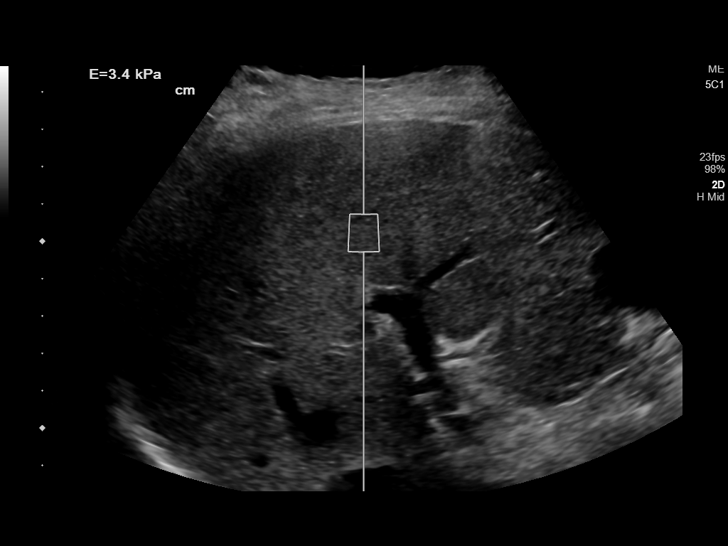
[im 10/12]
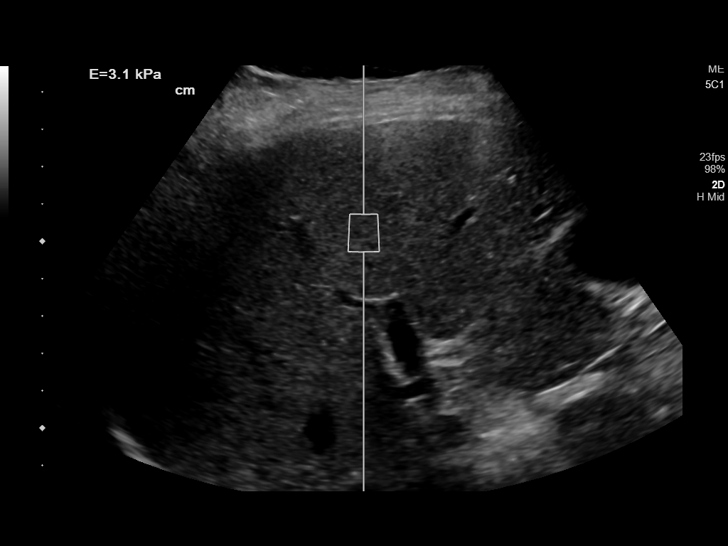
[im 11/12]
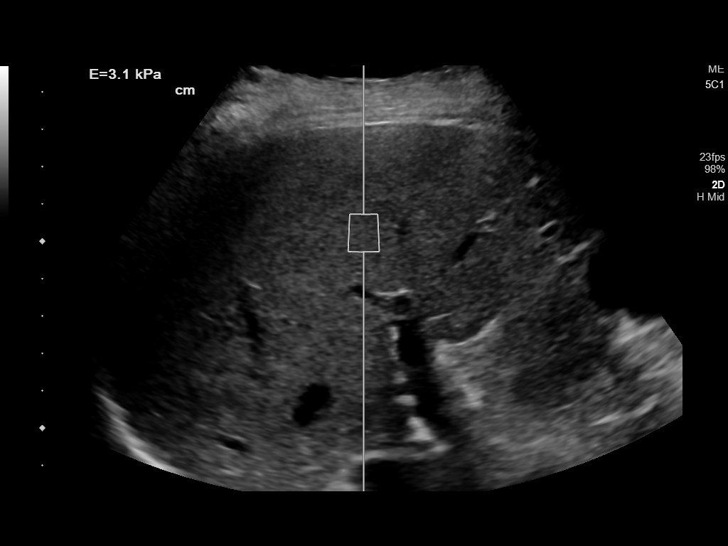
[im 12/12]
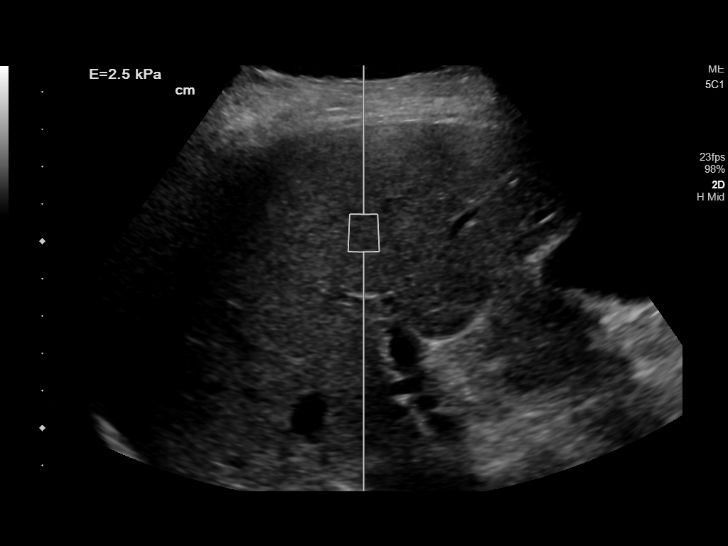

[12 of 12 positions shown; findings below may reference images not displayed]

FINDINGS: ULTRASOUND ABDOMEN LIMITED RIGHT UPPER QUADRANT

Gallbladder:

No gallstones or wall thickening visualized. No sonographic Murphy
sign noted.

Common bile duct:

Diameter: Normal at 4 mm

Liver:

Anechoic cyst in the LEFT hepatic lobe measures 1.2 cm.. Within
normal limits in parenchymal echogenicity. Portal vein is patent on
color Doppler imaging with normal direction of blood flow towards
the liver.

ULTRASOUND HEPATIC ELASTOGRAPHY

Device: Siemens Helix VTQ

Patient position: Supine

Transducer [id number]

Number of measurements: 10

Hepatic segment:  8

Median velocity:   0.99 m/sec

IQR:

IQR/Median velocity ratio:

Corresponding Metavir fibrosis score:  F0/F1

Risk of fibrosis: Minimal

Limitations of exam: None

Please note that abnormal shear wave velocities may also be
identified in clinical settings other than with hepatic fibrosis,
such as: acute hepatitis, elevated right heart and central venous
pressures including use of beta blockers, Ysmin disease
(Jong Gyu), infiltrative processes such as
mastocytosis/amyloidosis/infiltrative tumor, extrahepatic
cholestasis, in the post-prandial state, and liver transplantation.
Correlation with patient history, laboratory data, and clinical
condition recommended.
IMPRESSION: ULTRASOUND ABDOMEN: Normal liver and gallbladder.

ULTRASOUND HEPATIC ELASTOGRAHY:

Median hepatic shear wave velocity is calculated at 0.9 m/sec.

Corresponding Metavir fibrosis score is F0/F1.

Risk of fibrosis is minimal.

Follow-up: None required

## 2021-05-24 IMAGING — CR DG HIP (WITH OR WITHOUT PELVIS) 2-3V RIGHT
1 series · 3 of 3 positions shown · non-contrast
Comparison: None.

CLINICAL DATA: Right posterior hip pain for the past month. No
known injury.

EXAM:
DG HIP (WITH OR WITHOUT PELVIS) 2-3V RIGHT

[Series 1: dg hip unilat w or w/o pelvis 2-3 views  · non-contrast · 0.14mm/px · 3 of 3 slices shown]
[im 1/3]
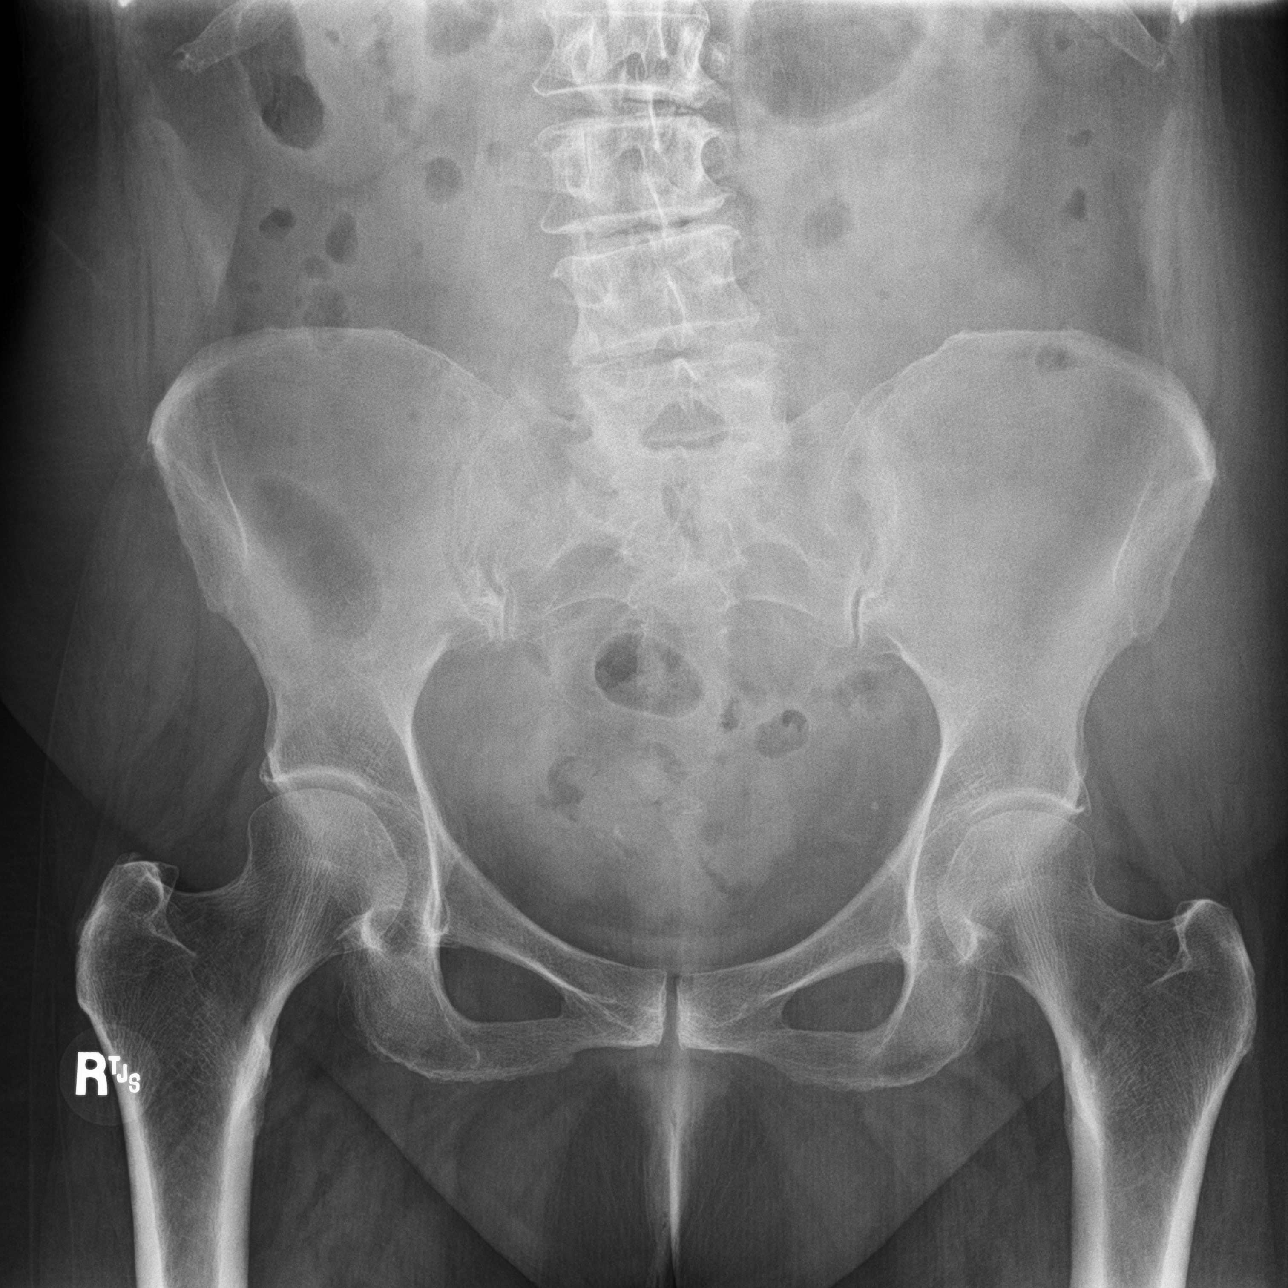
[im 2/3]
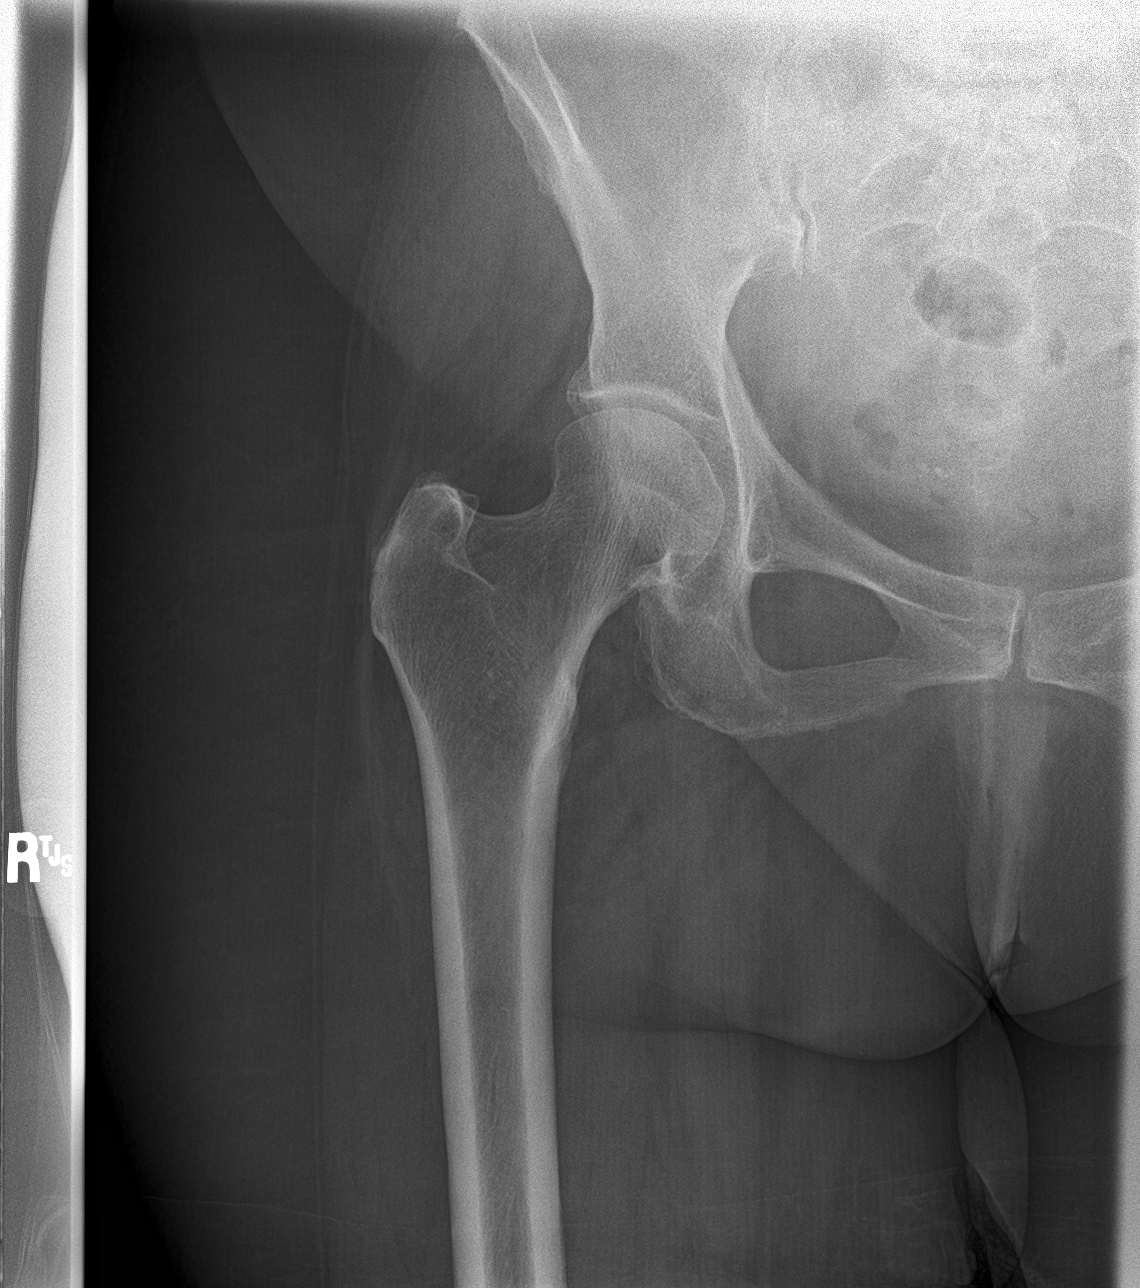
[im 3/3]
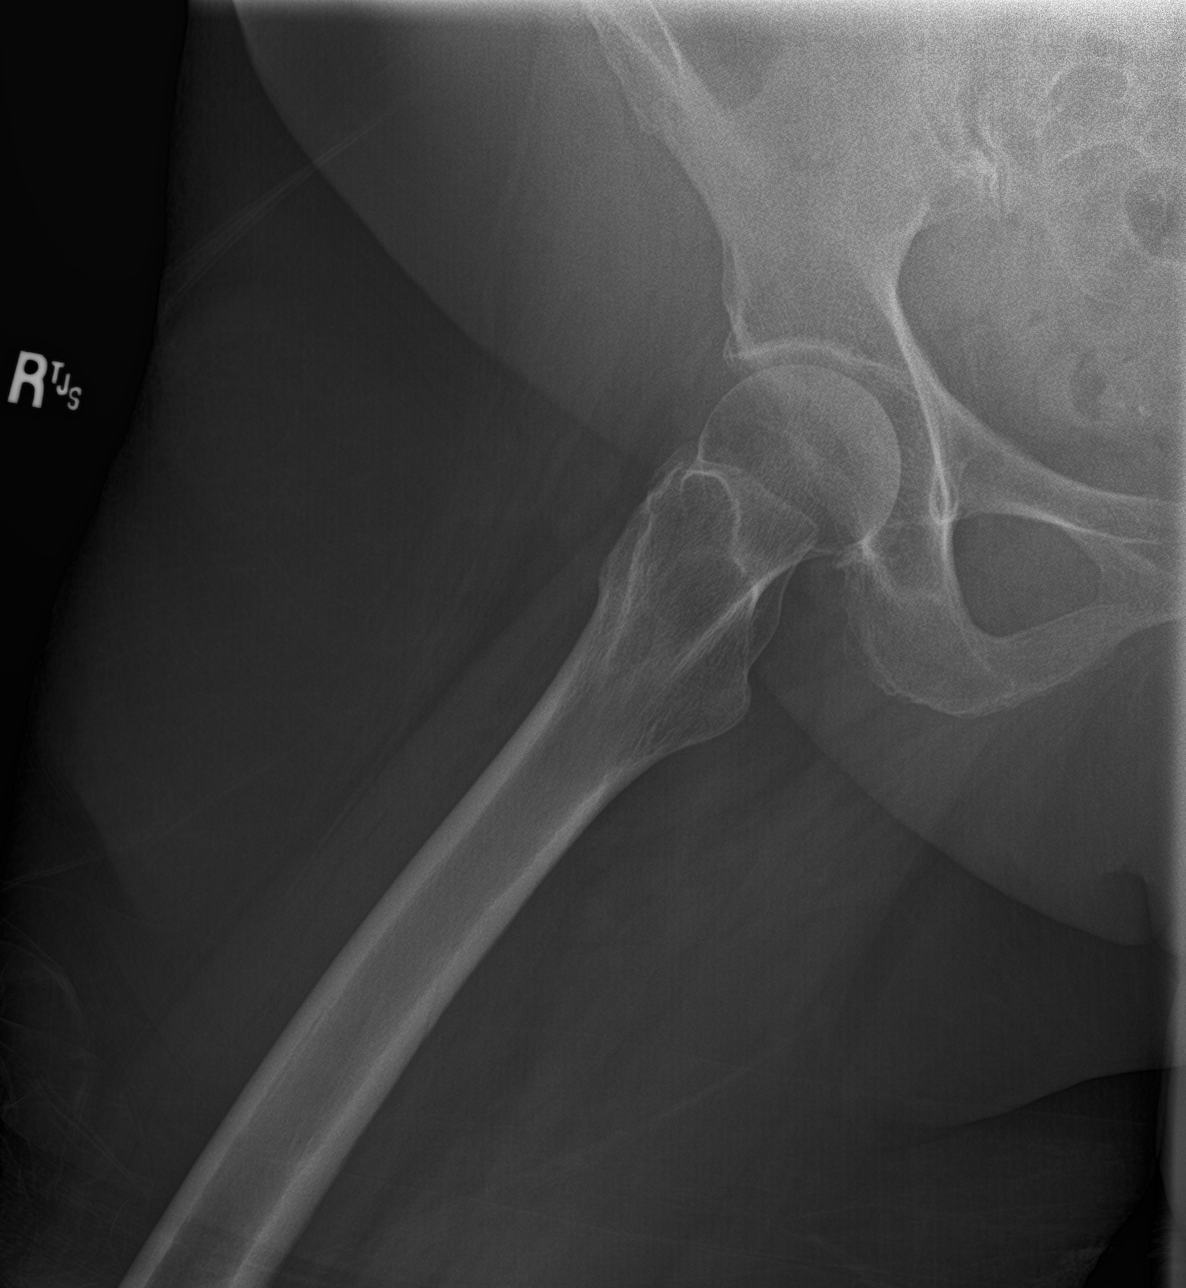

[3 of 3 positions shown; findings below may reference images not displayed]

FINDINGS: No fracture or dislocation.

Mild degenerative change of the right hip with joint space loss and
subchondral sclerosis. No evidence of avascular necrosis.

Limited visualization of the pelvis is normal. Limited visualization
of the contralateral left hip suggests similar-appearing mild
degenerative change, incompletely evaluated. Degenerative change the
lower lumbar spine is suspected though incompletely evaluated.

Phleboliths overlie the pelvis bilaterally. Regional soft tissues
appear otherwise normal.
IMPRESSION: Mild degenerative change of the right hip.

## 2021-08-01 IMAGING — CT CT BIOPSY
1 of 2 series · 9 of 14 positions shown, 12 images · non-contrast
Comparison: none

INDICATION: 73-year-old with probable metastatic disease and needs a tissue
diagnosis. Lytic lesion involving the posterior right ilium.

[Series 2: i-spiral 5.0 b30f · axial · 0.94mm/px · z∈[-224,-140]mm · 9 of 30 slices shown, 12 images]
[im 3/30  soft-tissue]
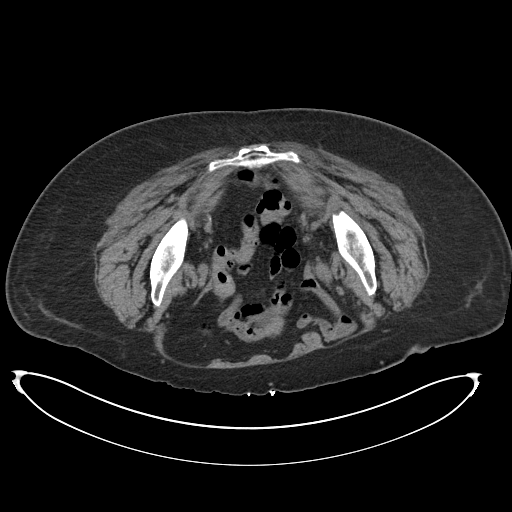
[im 3/30  bone]
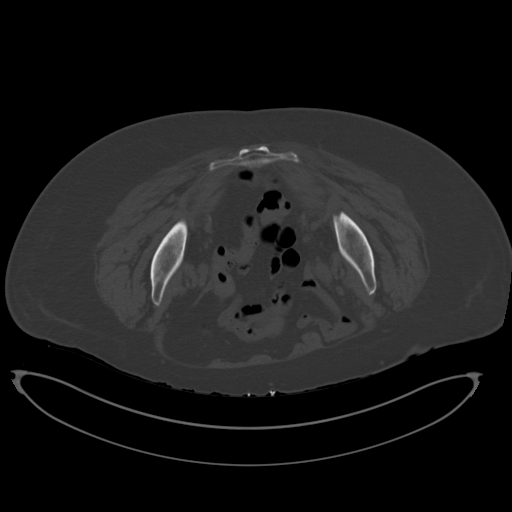
[im 6/30  bone]
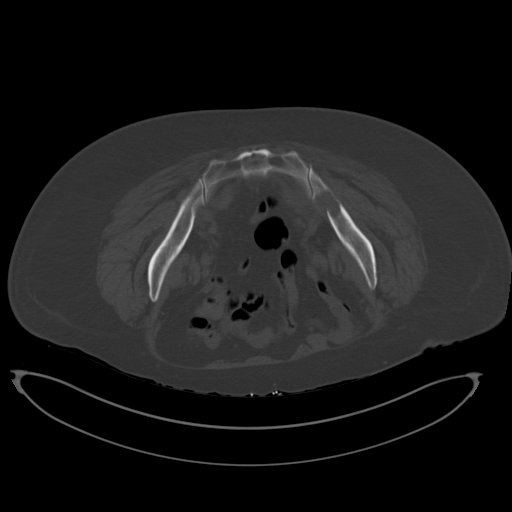
[im 9/30  bone]
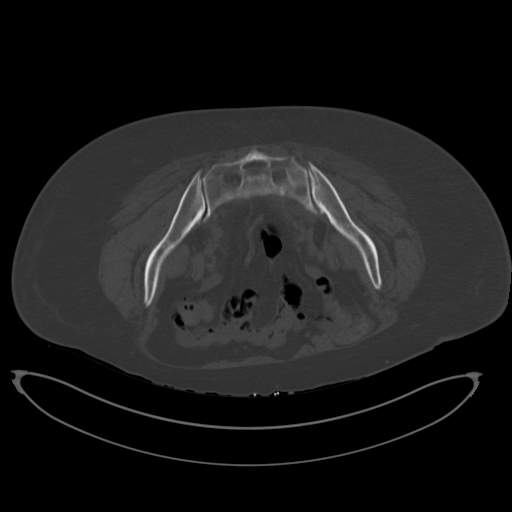
[im 12/30  bone]
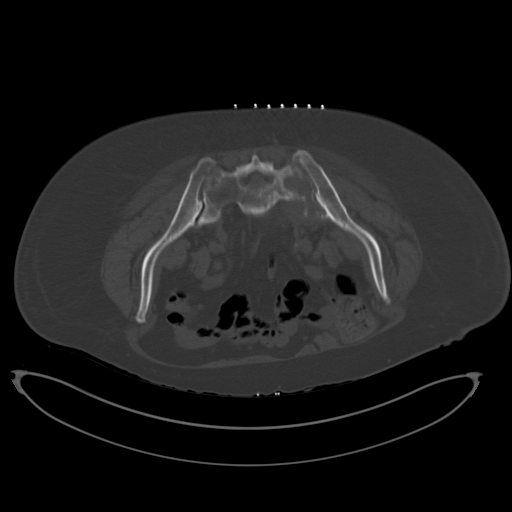
[im 15/30  soft-tissue]
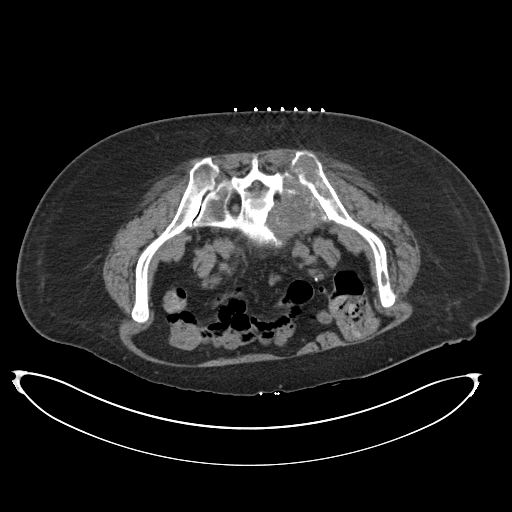
[im 15/30  bone]
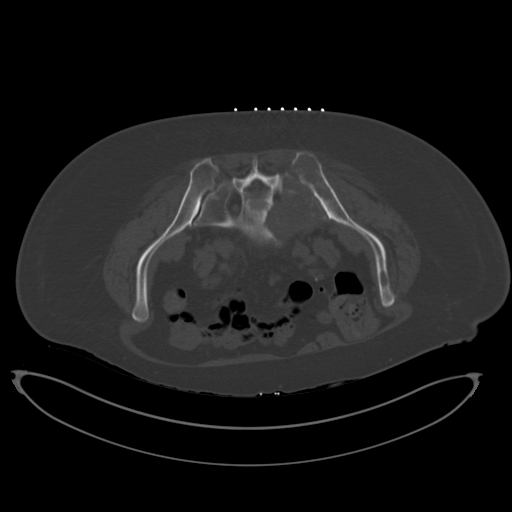
[im 18/30  bone]
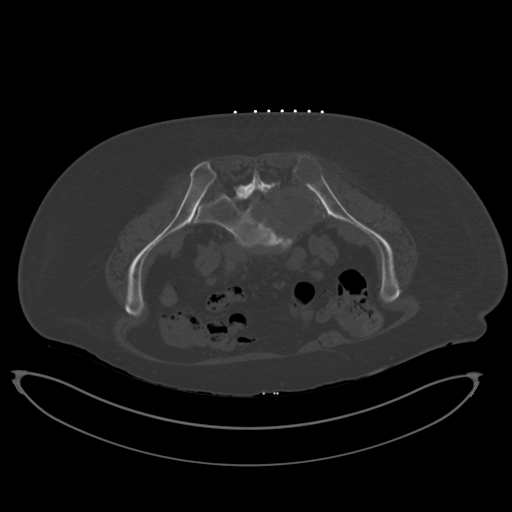
[im 21/30  bone]
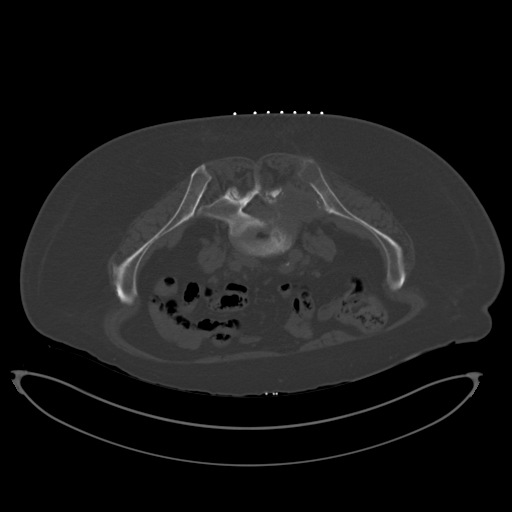
[im 24/30  bone]
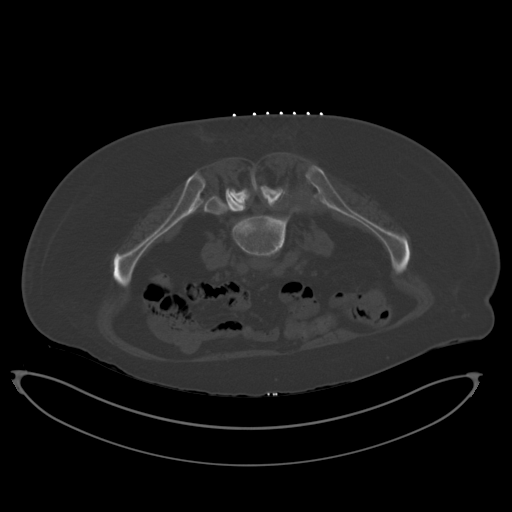
[im 27/30  soft-tissue]
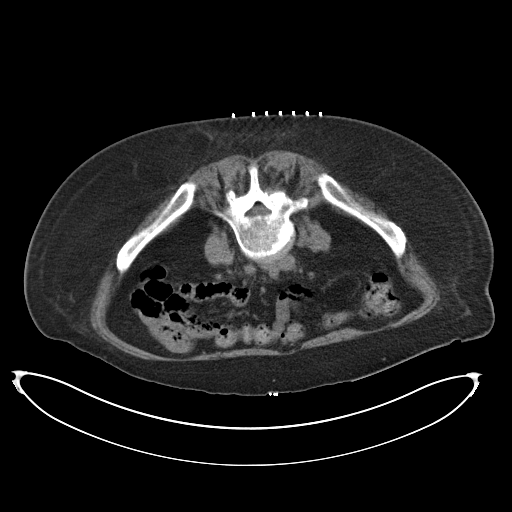
[im 27/30  bone]
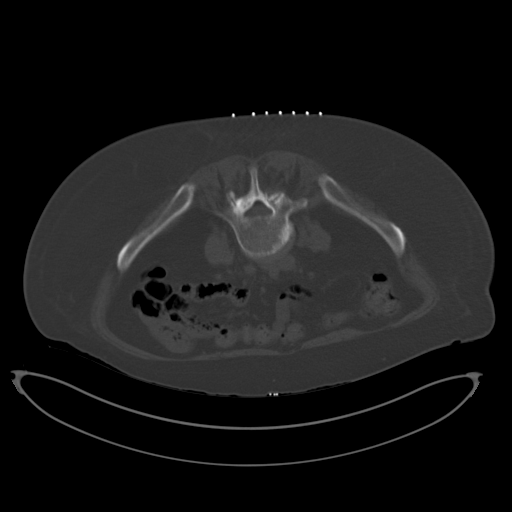

[9 of 14 positions shown; findings below may reference images not displayed]

EXAM:
CT-GUIDED CORE BIOPSY OF RIGHT ILIAC BONE LESION

MEDICATIONS:
None.

ANESTHESIA/SEDATION:
Moderate (conscious) sedation was employed during this procedure. A
total of Versed 1.0 mg and Fentanyl 50 mcg was administered
intravenously.

Moderate Sedation Time: 11 minutes. The patient's level of
consciousness and vital signs were monitored continuously by
radiology nursing throughout the procedure under my direct
supervision.

FLUOROSCOPY TIME:  None

COMPLICATIONS:
None immediate.

PROCEDURE:
Informed written consent was obtained from the patient after a
thorough discussion of the procedural risks, benefits and
alternatives. All questions were addressed. A timeout was performed
prior to the initiation of the procedure.

Patient was placed prone. CT images through the pelvis were
obtained. The destructive lesion in the posterior right ilium was
targeted for biopsy. The overlying skin was prepped with
chlorhexidine and sterile field was created. Skin and soft tissues
were anesthetized with 1% lidocaine. Using CT guidance, 17 gauge
coaxial needle was easily directed through the cortex and into the
soft tissue lesion within the bone. A total of 4 core biopsies were
obtained with an 18 gauge device. Specimens placed in formalin.
Needle was removed without complication.
FINDINGS: Soft tissue lesions involving the right sacrum and the posterior
right ilium. Needle was easily advanced into the right posterior
iliac bone lesion. Four soft tissue core biopsies were obtained.
IMPRESSION: CT-guided core biopsies of the posterior right iliac bone lesion.
